# Patient Record
Sex: Male | Born: 1970
Health system: Southern US, Community
[De-identification: ages and names within clinical notes are randomized; demographics above are authoritative.]

## PROBLEM LIST (undated history)

## (undated) DIAGNOSIS — E785 Hyperlipidemia, unspecified: Secondary | ICD-10-CM

## (undated) DIAGNOSIS — T8859XA Other complications of anesthesia, initial encounter: Secondary | ICD-10-CM

## (undated) DIAGNOSIS — C6290 Malignant neoplasm of unspecified testis, unspecified whether descended or undescended: Secondary | ICD-10-CM

## (undated) DIAGNOSIS — T7840XA Allergy, unspecified, initial encounter: Secondary | ICD-10-CM

## (undated) DIAGNOSIS — Z9889 Other specified postprocedural states: Secondary | ICD-10-CM

## (undated) DIAGNOSIS — R112 Nausea with vomiting, unspecified: Secondary | ICD-10-CM

## (undated) DIAGNOSIS — M419 Scoliosis, unspecified: Secondary | ICD-10-CM

## (undated) DIAGNOSIS — T4145XA Adverse effect of unspecified anesthetic, initial encounter: Secondary | ICD-10-CM

## (undated) DIAGNOSIS — Z87442 Personal history of urinary calculi: Secondary | ICD-10-CM

## (undated) DIAGNOSIS — B2790 Infectious mononucleosis, unspecified without complication: Secondary | ICD-10-CM

## (undated) HISTORY — DX: Scoliosis, unspecified: M41.9

## (undated) HISTORY — DX: Malignant neoplasm of unspecified testis, unspecified whether descended or undescended: C62.90

## (undated) HISTORY — DX: Hyperlipidemia, unspecified: E78.5

## (undated) HISTORY — PX: BACK SURGERY: SHX140

## (undated) HISTORY — DX: Infectious mononucleosis, unspecified without complication: B27.90

## (undated) HISTORY — PX: UPPER GASTROINTESTINAL ENDOSCOPY: SHX188

## (undated) HISTORY — PX: WISDOM TOOTH EXTRACTION: SHX21

## (undated) HISTORY — DX: Allergy, unspecified, initial encounter: T78.40XA

---
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
## 2007-01-14 DIAGNOSIS — Z923 Personal history of irradiation: Secondary | ICD-10-CM

## 2007-01-14 DIAGNOSIS — C6290 Malignant neoplasm of unspecified testis, unspecified whether descended or undescended: Secondary | ICD-10-CM

## 2007-01-14 HISTORY — PX: OTHER SURGICAL HISTORY: SHX169

## 2007-01-14 HISTORY — DX: Personal history of irradiation: Z92.3

## 2007-01-14 HISTORY — DX: Malignant neoplasm of unspecified testis, unspecified whether descended or undescended: C62.90

## 2007-08-19 ENCOUNTER — Encounter: Admission: RE | Admit: 2007-08-19 | Discharge: 2007-08-19 | Payer: Self-pay | Admitting: Urology

## 2007-08-20 ENCOUNTER — Ambulatory Visit (HOSPITAL_BASED_OUTPATIENT_CLINIC_OR_DEPARTMENT_OTHER): Admission: RE | Admit: 2007-08-20 | Discharge: 2007-08-20 | Payer: Self-pay | Admitting: Urology

## 2007-08-20 ENCOUNTER — Encounter (INDEPENDENT_AMBULATORY_CARE_PROVIDER_SITE_OTHER): Payer: Self-pay | Admitting: Urology

## 2007-08-26 ENCOUNTER — Ambulatory Visit: Payer: Self-pay | Admitting: Oncology

## 2007-08-26 ENCOUNTER — Ambulatory Visit (HOSPITAL_COMMUNITY): Admission: RE | Admit: 2007-08-26 | Discharge: 2007-08-26 | Payer: Self-pay | Admitting: Urology

## 2007-09-08 LAB — CBC WITH DIFFERENTIAL/PLATELET
BASO%: 0.8 % (ref 0.0–2.0)
Eosinophils Absolute: 0.1 10*3/uL (ref 0.0–0.5)
MCHC: 35.2 g/dL (ref 32.0–35.9)
MONO#: 0.5 10*3/uL (ref 0.1–0.9)
NEUT#: 2.8 10*3/uL (ref 1.5–6.5)
Platelets: 205 10*3/uL (ref 145–400)
RBC: 4.85 10*6/uL (ref 4.20–5.71)
WBC: 5.2 10*3/uL (ref 4.0–10.0)
lymph#: 1.7 10*3/uL (ref 0.9–3.3)

## 2007-09-09 ENCOUNTER — Ambulatory Visit (HOSPITAL_COMMUNITY): Admission: RE | Admit: 2007-09-09 | Discharge: 2007-09-09 | Payer: Self-pay | Admitting: Oncology

## 2007-09-10 ENCOUNTER — Ambulatory Visit: Admission: RE | Admit: 2007-09-10 | Discharge: 2007-11-07 | Payer: Self-pay | Admitting: Radiation Oncology

## 2007-09-10 LAB — COMPREHENSIVE METABOLIC PANEL
ALT: 35 U/L (ref 0–53)
Albumin: 4.7 g/dL (ref 3.5–5.2)
CO2: 27 mEq/L (ref 19–32)
Calcium: 9.8 mg/dL (ref 8.4–10.5)
Chloride: 101 mEq/L (ref 96–112)
Glucose, Bld: 87 mg/dL (ref 70–99)
Potassium: 4.2 mEq/L (ref 3.5–5.3)
Sodium: 137 mEq/L (ref 135–145)
Total Protein: 7.2 g/dL (ref 6.0–8.3)

## 2007-09-10 LAB — AFP TUMOR MARKER: AFP-Tumor Marker: 2.8 ng/mL (ref 0.0–8.0)

## 2007-09-10 LAB — LACTATE DEHYDROGENASE: LDH: 112 U/L (ref 94–250)

## 2007-10-25 ENCOUNTER — Ambulatory Visit: Payer: Self-pay | Admitting: Oncology

## 2007-10-27 LAB — CBC WITH DIFFERENTIAL/PLATELET
Basophils Absolute: 0 10*3/uL (ref 0.0–0.1)
Eosinophils Absolute: 0.2 10*3/uL (ref 0.0–0.5)
HCT: 39.3 % (ref 38.7–49.9)
HGB: 14.1 g/dL (ref 13.0–17.1)
LYMPH%: 13.9 % — ABNORMAL LOW (ref 14.0–48.0)
MCV: 87.7 fL (ref 81.6–98.0)
MONO#: 0.5 10*3/uL (ref 0.1–0.9)
MONO%: 16.4 % — ABNORMAL HIGH (ref 0.0–13.0)
NEUT#: 1.8 10*3/uL (ref 1.5–6.5)
Platelets: 145 10*3/uL (ref 145–400)
RBC: 4.48 10*6/uL (ref 4.20–5.71)
WBC: 2.8 10*3/uL — ABNORMAL LOW (ref 4.0–10.0)

## 2007-10-29 LAB — LACTATE DEHYDROGENASE: LDH: 156 U/L (ref 94–250)

## 2007-10-29 LAB — COMPREHENSIVE METABOLIC PANEL
Albumin: 4.7 g/dL (ref 3.5–5.2)
Alkaline Phosphatase: 49 U/L (ref 39–117)
BUN: 13 mg/dL (ref 6–23)
CO2: 27 mEq/L (ref 19–32)
Glucose, Bld: 77 mg/dL (ref 70–99)
Sodium: 140 mEq/L (ref 135–145)
Total Bilirubin: 0.4 mg/dL (ref 0.3–1.2)
Total Protein: 7.2 g/dL (ref 6.0–8.3)

## 2007-10-29 LAB — AFP TUMOR MARKER: AFP-Tumor Marker: 3.9 ng/mL (ref 0.0–8.0)

## 2007-10-29 LAB — BETA HCG QUANT (REF LAB): Beta hCG, Tumor Marker: <0.5 m[IU]/mL (ref <5.0)

## 2007-12-28 ENCOUNTER — Ambulatory Visit: Payer: Self-pay | Admitting: Oncology

## 2008-01-05 ENCOUNTER — Ambulatory Visit (HOSPITAL_COMMUNITY): Admission: RE | Admit: 2008-01-05 | Discharge: 2008-01-05 | Payer: Self-pay | Admitting: Oncology

## 2008-01-14 HISTORY — PX: UPPER GI ENDOSCOPY: SHX6162

## 2008-01-17 LAB — CBC WITH DIFFERENTIAL/PLATELET
EOS%: 0.8 % (ref 0.0–7.0)
Eosinophils Absolute: 0 10*3/uL (ref 0.0–0.5)
MCH: 31.5 pg (ref 28.0–33.4)
MCV: 90.6 fL (ref 81.6–98.0)
MONO%: 8.1 % (ref 0.0–13.0)
NEUT#: 3.4 10*3/uL (ref 1.5–6.5)
RBC: 4.7 10*6/uL (ref 4.20–5.71)
RDW: 13.7 % (ref 11.2–14.6)
lymph#: 0.6 10*3/uL — ABNORMAL LOW (ref 0.9–3.3)

## 2008-01-19 LAB — AFP TUMOR MARKER: AFP-Tumor Marker: 4.3 ng/mL (ref 0.0–8.0)

## 2008-01-19 LAB — COMPREHENSIVE METABOLIC PANEL WITH GFR
ALT: 39 U/L (ref 0–53)
AST: 21 U/L (ref 0–37)
Albumin: 5.1 g/dL (ref 3.5–5.2)
Alkaline Phosphatase: 42 U/L (ref 39–117)
BUN: 12 mg/dL (ref 6–23)
CO2: 27 meq/L (ref 19–32)
Calcium: 10 mg/dL (ref 8.4–10.5)
Chloride: 102 meq/L (ref 96–112)
Creatinine, Ser: 0.87 mg/dL (ref 0.40–1.50)
Glucose, Bld: 116 mg/dL — ABNORMAL HIGH (ref 70–99)
Potassium: 3.9 meq/L (ref 3.5–5.3)
Sodium: 139 meq/L (ref 135–145)
Total Bilirubin: 0.6 mg/dL (ref 0.3–1.2)
Total Protein: 7.6 g/dL (ref 6.0–8.3)

## 2008-04-13 ENCOUNTER — Ambulatory Visit: Payer: Self-pay | Admitting: Oncology

## 2008-04-17 LAB — CBC WITH DIFFERENTIAL/PLATELET
Basophils Absolute: 0 10*3/uL (ref 0.0–0.1)
Eosinophils Absolute: 0 10*3/uL (ref 0.0–0.5)
HCT: 42.6 % (ref 38.4–49.9)
LYMPH%: 11.8 % — ABNORMAL LOW (ref 14.0–49.0)
MONO#: 0.4 10*3/uL (ref 0.1–0.9)
NEUT#: 3.8 10*3/uL (ref 1.5–6.5)
NEUT%: 78.9 % — ABNORMAL HIGH (ref 39.0–75.0)
Platelets: 180 10*3/uL (ref 140–400)
WBC: 4.8 10*3/uL (ref 4.0–10.3)

## 2008-04-17 LAB — COMPREHENSIVE METABOLIC PANEL
CO2: 28 mEq/L (ref 19–32)
Creatinine, Ser: 0.83 mg/dL (ref 0.40–1.50)
Glucose, Bld: 104 mg/dL — ABNORMAL HIGH (ref 70–99)
Total Bilirubin: 0.7 mg/dL (ref 0.3–1.2)
Total Protein: 7.5 g/dL (ref 6.0–8.3)

## 2008-04-17 LAB — LACTATE DEHYDROGENASE: LDH: 126 U/L (ref 94–250)

## 2008-04-19 LAB — BETA HCG QUANT (REF LAB): Beta hCG, Tumor Marker: <0.5 m[IU]/mL (ref <5.0)

## 2008-04-19 LAB — AFP TUMOR MARKER: AFP-Tumor Marker: 3.5 ng/mL (ref 0.0–8.0)

## 2008-07-24 ENCOUNTER — Ambulatory Visit: Payer: Self-pay | Admitting: Oncology

## 2008-08-17 LAB — CBC WITH DIFFERENTIAL/PLATELET
BASO%: 0.3 % (ref 0.0–2.0)
EOS%: 1 % (ref 0.0–7.0)
HCT: 42.1 % (ref 38.4–49.9)
LYMPH%: 19.3 % (ref 14.0–49.0)
MCH: 30.6 pg (ref 27.2–33.4)
MCHC: 34.2 g/dL (ref 32.0–36.0)
NEUT%: 69.8 % (ref 39.0–75.0)
Platelets: 166 10*3/uL (ref 140–400)
RBC: 4.7 10*6/uL (ref 4.20–5.82)

## 2008-08-17 LAB — COMPREHENSIVE METABOLIC PANEL
ALT: 27 U/L (ref 0–53)
AST: 24 U/L (ref 0–37)
Alkaline Phosphatase: 33 U/L — ABNORMAL LOW (ref 39–117)
Creatinine, Ser: 0.84 mg/dL (ref 0.40–1.50)
Sodium: 134 mEq/L — ABNORMAL LOW (ref 135–145)
Total Bilirubin: 0.6 mg/dL (ref 0.3–1.2)

## 2008-08-18 ENCOUNTER — Ambulatory Visit (HOSPITAL_COMMUNITY): Admission: RE | Admit: 2008-08-18 | Discharge: 2008-08-18 | Payer: Self-pay | Admitting: Oncology

## 2008-09-02 ENCOUNTER — Ambulatory Visit: Payer: Self-pay | Admitting: Family Medicine

## 2008-09-02 DIAGNOSIS — R079 Chest pain, unspecified: Secondary | ICD-10-CM

## 2008-09-03 ENCOUNTER — Encounter: Payer: Self-pay | Admitting: Family Medicine

## 2008-11-15 ENCOUNTER — Ambulatory Visit: Payer: Self-pay | Admitting: Oncology

## 2008-11-17 ENCOUNTER — Ambulatory Visit (HOSPITAL_COMMUNITY): Admission: RE | Admit: 2008-11-17 | Discharge: 2008-11-17 | Payer: Self-pay | Admitting: Oncology

## 2008-11-17 LAB — CBC WITH DIFFERENTIAL/PLATELET
BASO%: 0.3 % (ref 0.0–2.0)
Basophils Absolute: 0 10*3/uL (ref 0.0–0.1)
EOS%: 1 % (ref 0.0–7.0)
MCH: 31.4 pg (ref 27.2–33.4)
MCHC: 34.5 g/dL (ref 32.0–36.0)
MCV: 91 fL (ref 79.3–98.0)
MONO%: 10.7 % (ref 0.0–14.0)
RBC: 4.55 10*6/uL (ref 4.20–5.82)
RDW: 13.8 % (ref 11.0–14.6)
lymph#: 0.6 10*3/uL — ABNORMAL LOW (ref 0.9–3.3)

## 2008-11-19 LAB — COMPREHENSIVE METABOLIC PANEL
ALT: 41 U/L (ref 0–53)
AST: 22 U/L (ref 0–37)
Albumin: 4.6 g/dL (ref 3.5–5.2)
Alkaline Phosphatase: 43 U/L (ref 39–117)
BUN: 11 mg/dL (ref 6–23)
Calcium: 9.6 mg/dL (ref 8.4–10.5)
Chloride: 103 mEq/L (ref 96–112)
Potassium: 4.5 mEq/L (ref 3.5–5.3)

## 2008-11-19 LAB — BETA HCG QUANT (REF LAB): Beta hCG, Tumor Marker: <0.5 m[IU]/mL (ref <5.0)

## 2009-01-13 HISTORY — PX: OTHER SURGICAL HISTORY: SHX169

## 2009-02-14 ENCOUNTER — Ambulatory Visit: Payer: Self-pay | Admitting: Oncology

## 2009-03-16 ENCOUNTER — Ambulatory Visit (HOSPITAL_COMMUNITY): Admission: RE | Admit: 2009-03-16 | Discharge: 2009-03-16 | Payer: Self-pay | Admitting: Oncology

## 2009-03-16 ENCOUNTER — Ambulatory Visit: Payer: Self-pay | Admitting: Oncology

## 2009-03-16 LAB — CBC WITH DIFFERENTIAL/PLATELET
BASO%: 0.5 % (ref 0.0–2.0)
MCH: 31.4 pg (ref 27.2–33.4)
MONO%: 11.6 % (ref 0.0–14.0)
NEUT%: 65.4 % (ref 39.0–75.0)
Platelets: 157 10*3/uL (ref 140–400)
RBC: 4.67 10*6/uL (ref 4.20–5.82)
WBC: 3.4 10*3/uL — ABNORMAL LOW (ref 4.0–10.3)
lymph#: 0.7 10*3/uL — ABNORMAL LOW (ref 0.9–3.3)

## 2009-03-16 LAB — COMPREHENSIVE METABOLIC PANEL
ALT: 46 U/L (ref 0–53)
Alkaline Phosphatase: 40 U/L (ref 39–117)
Potassium: 4.3 mEq/L (ref 3.5–5.3)
Sodium: 136 mEq/L (ref 135–145)
Total Bilirubin: 0.5 mg/dL (ref 0.3–1.2)
Total Protein: 6.7 g/dL (ref 6.0–8.3)

## 2009-03-16 LAB — LACTATE DEHYDROGENASE: LDH: 103 U/L (ref 94–250)

## 2009-03-19 LAB — AFP TUMOR MARKER: AFP-Tumor Marker: 4.7 ng/mL (ref 0.0–8.0)

## 2009-07-20 ENCOUNTER — Ambulatory Visit: Payer: Self-pay | Admitting: Oncology

## 2009-09-13 ENCOUNTER — Ambulatory Visit: Payer: Self-pay | Admitting: Oncology

## 2009-12-31 ENCOUNTER — Ambulatory Visit: Payer: Self-pay | Admitting: Oncology

## 2010-01-01 ENCOUNTER — Ambulatory Visit (HOSPITAL_COMMUNITY)
Admission: RE | Admit: 2010-01-01 | Discharge: 2010-01-01 | Payer: Self-pay | Source: Home / Self Care | Attending: Oncology | Admitting: Oncology

## 2010-01-01 LAB — CBC WITH DIFFERENTIAL/PLATELET
BASO%: 0.6 % (ref 0.0–2.0)
Basophils Absolute: 0 10*3/uL (ref 0.0–0.1)
Eosinophils Absolute: 0 10*3/uL (ref 0.0–0.5)
HGB: 14.8 g/dL (ref 13.0–17.1)
MCHC: 34.4 g/dL (ref 32.0–36.0)
NEUT%: 64.4 % (ref 39.0–75.0)
Platelets: 172 10*3/uL (ref 140–400)
RDW: 14.3 % (ref 11.0–14.6)
WBC: 3.4 10*3/uL — ABNORMAL LOW (ref 4.0–10.3)

## 2010-01-01 LAB — CMP (CANCER CENTER ONLY)
Chloride: 103 mEq/L (ref 98–108)
Creat: 0.9 mg/dl (ref 0.6–1.2)
Glucose, Bld: 104 mg/dL (ref 73–118)
Total Bilirubin: 0.5 mg/dl (ref 0.20–1.60)
Total Protein: 7.1 g/dL (ref 6.4–8.1)

## 2010-01-03 LAB — BETA HCG QUANT (REF LAB): Beta hCG, Tumor Marker: 0.6 m[IU]/mL (ref <5.0)

## 2010-01-03 LAB — LACTATE DEHYDROGENASE: LDH: 123 U/L (ref 94–250)

## 2010-02-03 ENCOUNTER — Encounter: Payer: Self-pay | Admitting: Oncology

## 2010-05-28 NOTE — Op Note (Signed)
NAME:  Kevin Hess, Kevin Hess NO.:  [id number]   MEDICAL RECORD NO.:  [id number]          PATIENT TYPE:  AMB   LOCATION:  [facility]                         FACILITY:  [facility]   PHYSICIAN:  Ronald L. [name], M.D.  DATE OF BIRTH:  [birth year]   DATE OF PROCEDURE:  08/20/2007  DATE OF DISCHARGE:                               OPERATIVE REPORT   PREOPERATIVE DIAGNOSIS:  Right testicular mass.   POSTOPERATIVE DIAGNOSIS:  Right testicular mass.   PROCEDURE:  Right radical orchiectomy - inguinal approach.   ATTENDING PHYSICIAN:  Dr. Gaynelle Arabian.   RESIDENT PHYSICIAN:  Dr. Delman Kitten.   ANESTHESIA:  General plus local.   INDICATIONS FOR PROCEDURE:  Kevin Hess is a 40 year old white male with family  history positive for testicular cancer in two brothers, one had  seminoma, the other's path is unknown presently.  Both these were  diagnosed when they were in their mid 30s.  He was evaluated on August 12, 2007, after discovering a small mass in his right testicle on self-  testicular exam 24 hours prior to that ointment.  The mass was confirmed  by Dr. [name] as well as by ultrasound.  It was measured to be 1 cm x 1.5  cm x 1 cm.  He did have preoperative serum tumor markers which were all  negative.  Sperm banking was discussed as was testicular implant and the  patient is not interested in either of these options.  Risks, benefits,  consequences and concerns were discussed preoperatively and informed  consent was obtained.   PROCEDURE IN DETAIL:  The patient was brought to the operating room,  placed in supine position.  He was correctly identified by his wristband  and appropriate time-out was taken.  General anesthesia was delivered.  IV antibiotics were administered.  Once adequately anesthetized, his  right inguinal area as well as his scrotum were all clipped and his  entire lower abdomen and perineum were prepped and draped sterilely.  We  began our procedure by performing a close  thorough physical exam which  confirmed this mass.  No other abnormalities were noted.  Likewise, we  identified his anterior superior iliac spine and his pubic tubercle and  made an oblique incision sharply in the skin overlying the right  inguinal ligament.  We carried this down bluntly through the subcu  tissue and encountered 2 inferior epigastric veins, both of these were  double tied with chromic suture and ligated and cauterized.  We then got  to Scarpa's fascia, entered it sharply and extended our blunt dissection  down to his anterior rectus sheath/anterior wall of his inguinal canal.  The superficial inguinal ring was identified.  Of note, there was an  accessory branch of his inguinal nerve emanating from the anterior  aspect of the external ring.  We made a small nick in the anterior wall  of the inguinal canal and divided it laterally and medially in the  direction of its fibers.  We took great care to protect the ilioinguinal  nerve.  Of note, he did have the main nerve  that ran with the spermatic  cord as well as that accessory breast previously described.  Both these  were protected during our entire dissection.  Once we had dissected the  spermatic cord circumferentially using a blunt technique a quarter-inch  Penrose drain was passed around the spermatic cord twice and advanced  towards the level of the internal ring.  We then opened up our external  ring slightly in a caudal fashion which allowed [place] adequate space to  deliver the right testicle into the operative field.  We then used a  combination of sharp dissection and Bovie electrocautery to take down  all the gubernaculum fibers attaching it to the scrotum.  Once this was  done, our specimen was freely mobile.  We were able to visually identify  the tumor as well.  We then turned our attention to amputating our  specimen.  We progressed towards the proximal aspect of the cord at the  internal inguinal ring.  We  dissected the vas deferens free from our  spermatic cord and tied it with 0 silk sutures both medially and  laterally and divided it sharply.  We also dissected free multiple  cremasteric fibers and tied it with chromic suture laterally and divided  with Bovie electrocautery.  There was no active bleeding from this.  We  then double tied our spermatic cord with 2 sutures using 0 silk.  The  first one was a hand tie, the second was a stick tie.  These were both  lateral.  A single medial tie was placed with 0 silk suture as well and  the cord was then divided sharply.  Bovie electrocautery addressed the  spermatic cord stump.  There were no active bleeders from this and it  was returned to its intraperitoneal location.  We subsequently passed  our specimen off the table and turned our attention to our closure.  The  anterior wall of the inguinal canal was reapproximated with a running  suture of 3-0 Vicryl, taking great care not to incorporate either branch  of the ilioinguinal nerve in our closure.  We then closed Scarpa's  fascia with a running suture using 3-0 chromic.  We reapproximated the  subcutaneous tissues with multiple simple interrupted horizontal sutures  using 3-0 chromic as well and the skin was closed in subcuticular  fashion using 4-0 Vicryl.  Steri-Strips and Benzoin were applied  topically.  This marked the end of our procedure.  He tolerated the  procedure well.  There were no complications.  He awoke from anesthesia  and was taken to the recovery room in stable condition.  Dr. [name] was  present and participated in all aspects of the case.     ______________________________  Delman Kitten, M.D.      Ronald L. [name], M.D.  Electronically Signed    DW/MEDQ  D:  08/20/2007  T:  08/20/2007  Job:  [id number]

## 2010-07-03 ENCOUNTER — Other Ambulatory Visit: Payer: Self-pay | Admitting: Oncology

## 2010-07-03 ENCOUNTER — Encounter (HOSPITAL_BASED_OUTPATIENT_CLINIC_OR_DEPARTMENT_OTHER): Payer: BC Managed Care – PPO | Admitting: Oncology

## 2010-07-03 ENCOUNTER — Ambulatory Visit (HOSPITAL_COMMUNITY)
Admission: RE | Admit: 2010-07-03 | Discharge: 2010-07-03 | Disposition: A | Discharge status: Home / Self Care | Payer: BC Managed Care – PPO | Source: Ambulatory Visit | Attending: Oncology | Admitting: Oncology

## 2010-07-03 DIAGNOSIS — C6290 Malignant neoplasm of unspecified testis, unspecified whether descended or undescended: Secondary | ICD-10-CM

## 2010-07-03 LAB — CBC WITH DIFFERENTIAL/PLATELET
BASO%: 0.8 % (ref 0.0–2.0)
Basophils Absolute: 0 10*3/uL (ref 0.0–0.1)
EOS%: 1.1 % (ref 0.0–7.0)
HGB: 14.4 g/dL (ref 13.0–17.1)
MCH: 31.7 pg (ref 27.2–33.4)
MCHC: 35 g/dL (ref 32.0–36.0)
NEUT#: 2.9 10*3/uL (ref 1.5–6.5)
Platelets: 162 10*3/uL (ref 140–400)
WBC: 4.6 10*3/uL (ref 4.0–10.3)

## 2010-07-06 LAB — COMPREHENSIVE METABOLIC PANEL
Albumin: 4.7 g/dL (ref 3.5–5.2)
Alkaline Phosphatase: 42 U/L (ref 39–117)
BUN: 10 mg/dL (ref 6–23)
CO2: 24 mEq/L (ref 19–32)
Calcium: 10 mg/dL (ref 8.4–10.5)
Chloride: 103 mEq/L (ref 96–112)
Glucose, Bld: 90 mg/dL (ref 70–99)
Potassium: 4.4 mEq/L (ref 3.5–5.3)
Sodium: 139 mEq/L (ref 135–145)
Total Protein: 7.1 g/dL (ref 6.0–8.3)

## 2010-07-06 LAB — LACTATE DEHYDROGENASE: LDH: 117 U/L (ref 94–250)

## 2010-07-06 LAB — AFP TUMOR MARKER: AFP-Tumor Marker: 3.7 ng/mL (ref 0.0–8.0)

## 2010-07-06 LAB — BETA HCG QUANT (REF LAB): Beta hCG, Tumor Marker: 0.6 m[IU]/mL (ref <5.0)

## 2010-08-14 ENCOUNTER — Encounter (HOSPITAL_BASED_OUTPATIENT_CLINIC_OR_DEPARTMENT_OTHER): Payer: BC Managed Care – PPO | Admitting: Oncology

## 2010-08-14 DIAGNOSIS — C6290 Malignant neoplasm of unspecified testis, unspecified whether descended or undescended: Secondary | ICD-10-CM

## 2010-09-10 ENCOUNTER — Ambulatory Visit
Admission: RE | Admit: 2010-09-10 | Discharge: 2010-09-10 | Disposition: A | Discharge status: Home / Self Care | Payer: BC Managed Care – PPO | Source: Ambulatory Visit | Attending: Chiropractic Medicine | Admitting: Chiropractic Medicine

## 2010-09-10 ENCOUNTER — Other Ambulatory Visit: Payer: Self-pay | Admitting: Chiropractic Medicine

## 2010-09-10 DIAGNOSIS — M542 Cervicalgia: Secondary | ICD-10-CM

## 2010-09-10 DIAGNOSIS — M549 Dorsalgia, unspecified: Secondary | ICD-10-CM

## 2010-09-10 DIAGNOSIS — M419 Scoliosis, unspecified: Secondary | ICD-10-CM

## 2010-10-11 LAB — COMPREHENSIVE METABOLIC PANEL
AST: 26
Albumin: 4.4
Alkaline Phosphatase: 40
Chloride: 101
Creatinine, Ser: 0.87
GFR calc Af Amer: >60
Potassium: 4.1
Total Bilirubin: 0.8
Total Protein: 6.8

## 2010-10-11 LAB — CBC
HCT: 45.9
Hemoglobin: 15.6
MCHC: 34
Platelets: 189
RDW: 13.9

## 2011-01-25 ENCOUNTER — Telehealth: Payer: Self-pay | Admitting: Oncology

## 2011-01-25 NOTE — Telephone Encounter (Signed)
lmonvm for pt re appts for 2/22 and 2/26. Schedule mailed today.

## 2011-03-04 ENCOUNTER — Other Ambulatory Visit (HOSPITAL_BASED_OUTPATIENT_CLINIC_OR_DEPARTMENT_OTHER): Payer: BC Managed Care – PPO | Admitting: Lab

## 2011-03-04 ENCOUNTER — Encounter: Payer: Self-pay | Admitting: *Deleted

## 2011-03-04 DIAGNOSIS — C6290 Malignant neoplasm of unspecified testis, unspecified whether descended or undescended: Secondary | ICD-10-CM

## 2011-03-04 LAB — CBC WITH DIFFERENTIAL/PLATELET
Basophils Absolute: 0 10*3/uL (ref 0.0–0.1)
EOS%: 0.9 % (ref 0.0–7.0)
HGB: 14.2 g/dL (ref 13.0–17.1)
LYMPH%: 26 % (ref 14.0–49.0)
MCH: 30.4 pg (ref 27.2–33.4)
MCV: 89.5 fL (ref 79.3–98.0)
MONO%: 7.2 % (ref 0.0–14.0)
Platelets: 162 10*3/uL (ref 140–400)
RDW: 14.3 % (ref 11.0–14.6)

## 2011-03-07 ENCOUNTER — Other Ambulatory Visit: Payer: BC Managed Care – PPO

## 2011-03-07 LAB — COMPREHENSIVE METABOLIC PANEL
AST: 22 U/L (ref 0–37)
Alkaline Phosphatase: 39 U/L (ref 39–117)
BUN: 21 mg/dL (ref 6–23)
Creatinine, Ser: 0.86 mg/dL (ref 0.50–1.35)
Potassium: 4.1 mEq/L (ref 3.5–5.3)
Total Bilirubin: 0.4 mg/dL (ref 0.3–1.2)

## 2011-03-07 LAB — AFP TUMOR MARKER: AFP-Tumor Marker: 2.7 ng/mL (ref 0.0–8.0)

## 2011-03-07 LAB — BETA HCG QUANT (REF LAB): Beta hCG, Tumor Marker: <0.5 m[IU]/mL (ref <5.0)

## 2011-03-11 ENCOUNTER — Ambulatory Visit (HOSPITAL_BASED_OUTPATIENT_CLINIC_OR_DEPARTMENT_OTHER): Payer: BC Managed Care – PPO | Admitting: Oncology

## 2011-03-11 ENCOUNTER — Telehealth: Payer: Self-pay | Admitting: Oncology

## 2011-03-11 VITALS — BP 110/70 | HR 98 | Temp 98.0°F | Ht 72.0 in | Wt 221.3 lb

## 2011-03-11 DIAGNOSIS — C6290 Malignant neoplasm of unspecified testis, unspecified whether descended or undescended: Secondary | ICD-10-CM

## 2011-03-11 NOTE — Progress Notes (Signed)
Hematology and Oncology Follow Up Visit  Kevin Hess [id number] 06-18-1970 40 y.o. 03/11/2011 10:14 AM    CC: Kevin Hess, M.D. Kevin Hess, M.D. 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   Prior Therapy:  1. Status post radical orchiectomy in August 2009.  Pathology revealing a 2.2-cm classic seminoma. 2. Received adjuvant radiation therapy, a total of 26 Gy in 16 fractions.  Therapy concluded in October 2009.  Current therapy: Observation and surveillance.  Interim History: [name] presents today for a followup visit.  A pleasant 41 year old gentleman with seminomatous testicular cancer.  He is currently on observation and surveillance and he is  now more than 3 years out from conclusion of his radiation.  He is not reporting any symptomatology.  He is not reporting any abdominal pain.  He is not reporting any early satiety.  He is not reporting any lymphadenopathy at this point.  His appetite has been excellent.  Performance status is baseline.  There is really no major changes at this point.  He is not reporting any delayed toxicity from radiation at this time. He had headaches in recent months and was diagnosed with migraines.   Medications: I have reviewed the patient's current medications. Current outpatient prescriptions:Cholecalciferol (VITAMIN D3) LIQD, Take 4,000 Units by mouth daily., Disp: , Rfl: ;  SUMAtriptan (IMITREX) 100 MG tablet, Take 100 mg by mouth as needed., Disp: , Rfl:   Allergies:  Allergies  Allergen Reactions  . Penicillins     Past Medical History, Surgical history, Social history, and Family History were reviewed and updated.  Review of Systems: Constitutional:  Negative for fever, chills, night sweats, anorexia, weight loss, pain. Cardiovascular: no chest pain or dyspnea on exertion Respiratory: no cough, shortness of breath,  or wheezing Neurological: no TIA or stroke symptoms Dermatological: negative ENT: negative Skin: Negative. Gastrointestinal: no abdominal pain, change in bowel habits, or black or bloody stools Genito-Urinary: negative Hematological and Lymphatic: negative Breast: negative Musculoskeletal: negative Remaining ROS negative. Physical Exam: Blood pressure 110/70, pulse 98, temperature 98 F (36.7 C), temperature source Oral, height 6' (1.829 m), weight 221 lb 4.8 oz (100.381 kg). ECOG: 0 General appearance: alert Head: Normocephalic, without obvious abnormality, atraumatic Neck: no adenopathy, no carotid bruit, no JVD, supple, symmetrical, trachea midline and thyroid not enlarged, symmetric, no tenderness/mass/nodules Lymph nodes: Cervical, supraclavicular, and axillary nodes normal. Heart:regular rate and rhythm, S1, S2 normal, no murmur, click, rub or gallop Lung:chest clear, no wheezing, rales, normal symmetric air entry Abdomin: soft, non-tender, without masses or organomegaly EXT:no erythema, induration, or nodules   Lab Results: Lab Results  Component Value Date   WBC 4.2 03/04/2011   HGB 14.2 03/04/2011   HCT 41.9 03/04/2011   MCV 89.5 03/04/2011   PLT 162 03/04/2011     Chemistry      Component Value Date/Time   NA 135 03/04/2011 1349   NA 141 01/01/2010 0816   K 4.1 03/04/2011 1349   K 4.4 01/01/2010 0816   CL 100 03/04/2011 1349   CL 103 01/01/2010 0816   CO2 25 03/04/2011 1349   CO2 27 01/01/2010 0816   BUN 21 03/04/2011 1349   BUN 8 01/01/2010 0816   CREATININE 0.86 03/04/2011 1349   CREATININE 0.9 01/01/2010 0816      Component Value Date/Time   CALCIUM 9.7 03/04/2011 1349   CALCIUM 9.1  01/01/2010 0816   ALKPHOS 39 03/04/2011 1349   ALKPHOS 29 01/01/2010 0816   AST 22 03/04/2011 1349   AST 23 01/01/2010 0816   ALT 31 03/04/2011 1349   BILITOT 0.4 03/04/2011 1349   BILITOT 0.50 01/01/2010 0816     Results for Kevin Hess, Kevin Hess (MRN [id number]) as of 03/11/2011 10:18   Ref. Range 03/04/2011 13:49  AFP-Tumor Marker Latest Range: 0.0-8.0 ng/mL 2.7  Beta hCG, Tumor Marker Latest Range: < 5.0 mIU/mL < 0.5    Impression and Plan:  This is a 41 year old gentleman with the following issues. 1. Stage I seminoma status post orchiectomy followed by radiation therapy.  No evidence of disease at this point.  He is more than 3 years out from his surgery and the conclusion of his radiation.  For the time being, we will continue on active surveillance and repeat laboratory data and physical examination in 6 months, and chest x-ray in 6 months. Will do CT scans as needed.  2. Health maintenance.  Continue to advise him to follow up the date and age appropriate health screening and cancer screening at this time.  [facility], MD 2/26/201310:14 AM

## 2011-03-11 NOTE — Telephone Encounter (Signed)
Gv pt appt for aug2013. gv pt copy of chest xray order for aug2013

## 2011-08-29 ENCOUNTER — Telehealth: Payer: Self-pay | Admitting: Oncology

## 2011-08-29 NOTE — Telephone Encounter (Signed)
Moved 8/27 appt to 10/1. Lm for pt and mailed schedule. Pt to keep 8/23 lb appt.

## 2011-09-05 ENCOUNTER — Other Ambulatory Visit (HOSPITAL_BASED_OUTPATIENT_CLINIC_OR_DEPARTMENT_OTHER): Payer: BC Managed Care – PPO

## 2011-09-05 DIAGNOSIS — C6290 Malignant neoplasm of unspecified testis, unspecified whether descended or undescended: Secondary | ICD-10-CM

## 2011-09-05 LAB — CBC WITH DIFFERENTIAL/PLATELET
Basophils Absolute: 0 10*3/uL (ref 0.0–0.1)
HCT: 44.5 % (ref 38.4–49.9)
HGB: 15 g/dL (ref 13.0–17.1)
MONO#: 0.4 10*3/uL (ref 0.1–0.9)
NEUT#: 2.6 10*3/uL (ref 1.5–6.5)
NEUT%: 59.7 % (ref 39.0–75.0)
RDW: 14.4 % (ref 11.0–14.6)
WBC: 4.3 10*3/uL (ref 4.0–10.3)
lymph#: 1.2 10*3/uL (ref 0.9–3.3)

## 2011-09-08 LAB — COMPREHENSIVE METABOLIC PANEL
ALT: 30 U/L (ref 0–53)
AST: 19 U/L (ref 0–37)
Albumin: 4.5 g/dL (ref 3.5–5.2)
BUN: 11 mg/dL (ref 6–23)
CO2: 27 mEq/L (ref 19–32)
Calcium: 9.9 mg/dL (ref 8.4–10.5)
Chloride: 102 mEq/L (ref 96–112)
Creatinine, Ser: 0.86 mg/dL (ref 0.50–1.35)
Potassium: 4.5 mEq/L (ref 3.5–5.3)

## 2011-09-08 LAB — LACTATE DEHYDROGENASE: LDH: 115 U/L (ref 94–250)

## 2011-09-08 LAB — AFP TUMOR MARKER: AFP-Tumor Marker: 2.6 ng/mL (ref 0.0–8.0)

## 2011-09-09 ENCOUNTER — Ambulatory Visit: Payer: BC Managed Care – PPO | Admitting: Oncology

## 2011-10-13 ENCOUNTER — Other Ambulatory Visit: Payer: Self-pay | Admitting: Oncology

## 2011-10-13 ENCOUNTER — Ambulatory Visit (HOSPITAL_COMMUNITY)
Admission: RE | Admit: 2011-10-13 | Discharge: 2011-10-13 | Disposition: A | Discharge status: Home / Self Care | Payer: BC Managed Care – PPO | Source: Ambulatory Visit | Attending: Oncology | Admitting: Oncology

## 2011-10-13 DIAGNOSIS — Z8547 Personal history of malignant neoplasm of testis: Secondary | ICD-10-CM | POA: Insufficient documentation

## 2011-10-13 DIAGNOSIS — C6290 Malignant neoplasm of unspecified testis, unspecified whether descended or undescended: Secondary | ICD-10-CM

## 2011-10-14 ENCOUNTER — Telehealth: Payer: Self-pay | Admitting: Oncology

## 2011-10-14 ENCOUNTER — Ambulatory Visit (HOSPITAL_BASED_OUTPATIENT_CLINIC_OR_DEPARTMENT_OTHER): Payer: BC Managed Care – PPO | Admitting: Oncology

## 2011-10-14 VITALS — BP 124/80 | HR 64 | Temp 99.3°F | Resp 20 | Ht 72.0 in | Wt 223.8 lb

## 2011-10-14 DIAGNOSIS — C6290 Malignant neoplasm of unspecified testis, unspecified whether descended or undescended: Secondary | ICD-10-CM

## 2011-10-14 NOTE — Progress Notes (Signed)
Hematology and Oncology Follow Up Visit  Kevin Hess [id number] [birth year] 41 y.o. 10/14/2011 11:11 AM    CC: [name] L. [name], M.D. Oneita Hurt, M.D. 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  Prior Therapy:  1. Status post radical orchiectomy in August 2009.  Pathology revealing a 2.2-cm classic seminoma. 2. Received adjuvant radiation therapy, a total of 26 Gy in 16 fractions.  Therapy concluded in October 2009.  Current therapy: Observation and surveillance.  Interim History: Kevin Hess presents today for a followup visit.  A pleasant 41 year old gentleman with seminomatous testicular cancer.  He is currently on observation and surveillance and he is close  To 4 years out from conclusion of his radiation.  He is reporting some fatigue, weight gain and hair loss. His TSH was normal. He was found to have to testosterone and getting replacement.  He is not reporting any abdominal pain.  He is not reporting any early satiety.  He is not reporting any lymphadenopathy at this point.  Performance status is baseline.  There is really no major changes at this point.  He is not reporting any delayed toxicity from radiation at this time. He had headaches in recent months and was diagnosed with migraines.   Medications: I have reviewed the patient's current medications. Current outpatient prescriptions:Cholecalciferol (VITAMIN D3) LIQD, Take 4,000 Units by mouth daily., Disp: , Rfl: ;  SUMAtriptan (IMITREX) 100 MG tablet, Take 100 mg by mouth as needed., Disp: , Rfl:   Allergies:  Allergies  Allergen Reactions  . Penicillins     Past Medical History, Surgical history, Social history, and Family History were reviewed and updated.  Review of Systems: Constitutional:  Negative for fever, chills, night sweats, anorexia, weight loss, pain. Cardiovascular: no chest pain or dyspnea  on exertion Respiratory: no cough, shortness of breath, or wheezing Neurological: no TIA or stroke symptoms Dermatological: negative ENT: negative Skin: Negative. Gastrointestinal: no abdominal pain, change in bowel habits, or black or bloody stools Genito-Urinary: negative Hematological and Lymphatic: negative Breast: negative Musculoskeletal: negative Remaining ROS negative. Physical Exam: Blood pressure 124/80, pulse 64, temperature 99.3 F (37.4 C), temperature source Oral, resp. rate 20, height 6' (1.829 m), weight 223 lb 12.8 oz (101.515 kg). ECOG: 0 General appearance: alert Head: Normocephalic, without obvious abnormality, atraumatic Neck: no adenopathy, no carotid bruit, no JVD, supple, symmetrical, trachea midline and thyroid not enlarged, symmetric, no tenderness/mass/nodules Lymph nodes: Cervical, supraclavicular, and axillary nodes normal. Heart:regular rate and rhythm, S1, S2 normal, no murmur, click, rub or gallop Lung:chest clear, no wheezing, rales, normal symmetric air entry Abdomin: soft, non-tender, without masses or organomegaly EXT:no erythema, induration, or nodules   Lab Results: Lab Results  Component Value Date   WBC 4.3 09/05/2011   HGB 15.0 09/05/2011   HCT 44.5 09/05/2011   MCV 89.2 09/05/2011   PLT 171 09/05/2011     Chemistry      Component Value Date/Time   NA 138 09/05/2011 1313   NA 141 01/01/2010 0816   K 4.5 09/05/2011 1313   K 4.4 01/01/2010 0816   CL 102 09/05/2011 1313   CL 103 01/01/2010 0816   CO2 27 09/05/2011 1313   CO2 27 01/01/2010 0816   BUN 11 09/05/2011 1313   BUN 8 01/01/2010 0816   CREATININE 0.86 09/05/2011 1313   CREATININE 0.9 01/01/2010 0816  Component Value Date/Time   CALCIUM 9.9 09/05/2011 1313   CALCIUM 9.1 01/01/2010 0816   ALKPHOS 35* 09/05/2011 1313   ALKPHOS 29 01/01/2010 0816   AST 19 09/05/2011 1313   AST 23 01/01/2010 0816   ALT 30 09/05/2011 1313   BILITOT 0.3 09/05/2011 1313   BILITOT 0.50 01/01/2010  0816     Results for CAYETANO, MIKITA (MRN [id number]) as of 10/14/2011 10:51  Ref. Range 09/05/2011 13:13  AFP-Tumor Marker Latest Range: 0.0-8.0 ng/mL 2.6  Beta hCG, Tumor Marker Latest Range: < 5.0 mIU/mL < 0.5   CHEST - 2 VIEW  Comparison: July 03, 2010  Findings: Lungs are clear. Heart size and pulmonary vascularity  are normal. No adenopathy. There is rod fixation and thoracic  spine. Scoliosis is stable. There are no blastic or lytic bone  lesions.  IMPRESSION: Scoliosis with rod fixation. No neoplastic focus.  Lungs clear.  Impression and Plan:  This is a 41 year old gentleman with the following issues. 1. Stage I seminoma status post orchiectomy followed by radiation therapy.  No evidence of disease at this point.  He is almost 4 years out from his surgery and the conclusion of his radiation.  For the time being, we will continue on active surveillance and repeat laboratory data and physical examination in 6 months, and chest x-ray in 12 months. Will do CT scans as needed.  2. Health maintenance.  Continue to advise him to follow up the date and age appropriate health screening and cancer screening at this time.  Kevin Anglemyer, MD 10/1/201311:11 AM

## 2011-10-14 NOTE — Telephone Encounter (Signed)
lvm for pt...advised on april lab and march est....mailed appt schedule to pt....sed

## 2012-03-02 ENCOUNTER — Other Ambulatory Visit (HOSPITAL_BASED_OUTPATIENT_CLINIC_OR_DEPARTMENT_OTHER): Payer: BC Managed Care – PPO

## 2012-03-02 DIAGNOSIS — C6290 Malignant neoplasm of unspecified testis, unspecified whether descended or undescended: Secondary | ICD-10-CM

## 2012-03-02 LAB — CBC WITH DIFFERENTIAL/PLATELET
BASO%: 0.8 % (ref 0.0–2.0)
EOS%: 1.6 % (ref 0.0–7.0)
HCT: 43.5 % (ref 38.4–49.9)
LYMPH%: 28 % (ref 14.0–49.0)
MCH: 30.8 pg (ref 27.2–33.4)
MCHC: 34.5 g/dL (ref 32.0–36.0)
MONO#: 0.4 10*3/uL (ref 0.1–0.9)
NEUT%: 60.6 % (ref 39.0–75.0)
Platelets: 176 10*3/uL (ref 140–400)
RBC: 4.87 10*6/uL (ref 4.20–5.82)
WBC: 4.5 10*3/uL (ref 4.0–10.3)

## 2012-03-02 LAB — COMPREHENSIVE METABOLIC PANEL (CC13)
ALT: 52 U/L (ref 0–55)
AST: 27 U/L (ref 5–34)
Alkaline Phosphatase: 43 U/L (ref 40–150)
Creatinine: 0.9 mg/dL (ref 0.7–1.3)
Sodium: 140 mEq/L (ref 136–145)
Total Bilirubin: 0.39 mg/dL (ref 0.20–1.20)
Total Protein: 7.4 g/dL (ref 6.4–8.3)

## 2012-03-16 ENCOUNTER — Telehealth: Payer: Self-pay | Admitting: Oncology

## 2012-03-16 ENCOUNTER — Ambulatory Visit (HOSPITAL_BASED_OUTPATIENT_CLINIC_OR_DEPARTMENT_OTHER): Payer: BC Managed Care – PPO | Admitting: Oncology

## 2012-03-16 VITALS — BP 120/79 | HR 79 | Temp 98.3°F | Resp 20 | Ht 72.0 in | Wt 227.3 lb

## 2012-03-16 NOTE — Progress Notes (Signed)
Hematology and Oncology Follow Up Visit  Kevin Hess [id number] 04-Dec-1970 41 y.o. 03/16/2012 10:46 AM    CC: [name] L. [name], M.D. Oneita Hurt, M.D. 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  Prior Therapy:  1. Status post radical orchiectomy in August 2009.  Pathology revealing a 2.2-cm classic seminoma. 2. Received adjuvant radiation therapy, a total of 26 Gy in 16 fractions.  Therapy concluded in October 2009.  Current therapy: Observation and surveillance.  Interim History: Mr. Gee presents today for a followup visit.  A pleasant 42 year old gentleman with seminomatous testicular cancer.  He is currently on observation and surveillance and he is over 4 years out from conclusion of his radiation.  He is reporting some fatigue, weight gain but has improved some. He was found to have to testosterone and may start replacement soon. He is not reporting any abdominal pain.  He is not reporting any early satiety.  He is not reporting any lymphadenopathy at this point.  Performance status is baseline.  There is really no major changes at this point.  He is not reporting any delayed toxicity from radiation at this time.   Medications: I have reviewed the patient's current medications. Current outpatient prescriptions:Cholecalciferol (VITAMIN D3) LIQD, Take 4,000 Units by mouth daily., Disp: , Rfl: ;  DHEA 10 MG TABS, Take 10 mg by mouth daily., Disp: , Rfl: ;  fish oil-omega-3 fatty acids 1000 MG capsule, Take 1 g by mouth 2 (two) times daily., Disp: , Rfl: ;  hydrocortisone (CORTEF) 5 MG tablet, Take 5 mg by mouth daily., Disp: , Rfl:  lactobacillus acidophilus (BACID) TABS, Take 1 tablet by mouth daily. probiotic, Disp: , Rfl: ;  Melatonin 1 MG CAPS, Take 1 capsule by mouth at bedtime as needed., Disp: , Rfl: ;  Multiple Vitamin (MULTIVITAMIN) tablet, Take 1 tablet by mouth 2  (two) times daily., Disp: , Rfl: ;  SUMAtriptan (IMITREX) 100 MG tablet, Take 100 mg by mouth as needed., Disp: , Rfl:   Allergies:  Allergies  Allergen Reactions  . Penicillins     Past Medical History, Surgical history, Social history, and Family History were reviewed and updated.  Review of Systems: Constitutional:  Negative for fever, chills, night sweats, anorexia, weight loss, pain. Cardiovascular: no chest pain or dyspnea on exertion Respiratory: no cough, shortness of breath, or wheezing Neurological: no TIA or stroke symptoms Dermatological: negative ENT: negative Skin: Negative. Gastrointestinal: no abdominal pain, change in bowel habits, or black or bloody stools Genito-Urinary: negative Hematological and Lymphatic: negative Breast: negative Musculoskeletal: negative Remaining ROS negative. Physical Exam: Blood pressure 120/79, pulse 79, temperature 98.3 F (36.8 C), temperature source Oral, resp. rate 20, height 6' (1.829 m), weight 227 lb 4.8 oz (103.103 kg). ECOG: 0 General appearance: alert Head: Normocephalic, without obvious abnormality, atraumatic Neck: no adenopathy, no carotid bruit, no JVD, supple, symmetrical, trachea midline and thyroid not enlarged, symmetric, no tenderness/mass/nodules Lymph nodes: Cervical, supraclavicular, and axillary nodes normal. Heart:regular rate and rhythm, S1, S2 normal, no murmur, click, rub or gallop Lung:chest clear, no wheezing, rales, normal symmetric air entry Abdomin: soft, non-tender, without masses or organomegaly EXT:no erythema, induration, or nodules   Lab Results: Lab Results  Component Value Date   WBC 4.5 03/02/2012   HGB 15.0 03/02/2012   HCT 43.5 03/02/2012   MCV 89.2 03/02/2012   PLT 176 03/02/2012  Chemistry      Component Value Date/Time   NA 140 03/02/2012 1043   NA 138 09/05/2011 1313   NA 141 01/01/2010 0816   K 4.0 03/02/2012 1043   K 4.5 09/05/2011 1313   K 4.4 01/01/2010 0816   CL 104  03/02/2012 1043   CL 102 09/05/2011 1313   CL 103 01/01/2010 0816   CO2 27 03/02/2012 1043   CO2 27 09/05/2011 1313   CO2 27 01/01/2010 0816   BUN 9.4 03/02/2012 1043   BUN 11 09/05/2011 1313   BUN 8 01/01/2010 0816   CREATININE 0.9 03/02/2012 1043   CREATININE 0.86 09/05/2011 1313   CREATININE 0.9 01/01/2010 0816      Component Value Date/Time   CALCIUM 9.7 03/02/2012 1043   CALCIUM 9.9 09/05/2011 1313   CALCIUM 9.1 01/01/2010 0816   ALKPHOS 43 03/02/2012 1043   ALKPHOS 35* 09/05/2011 1313   ALKPHOS 29 01/01/2010 0816   AST 27 03/02/2012 1043   AST 19 09/05/2011 1313   AST 23 01/01/2010 0816   ALT 52 03/02/2012 1043   ALT 30 09/05/2011 1313   BILITOT 0.39 03/02/2012 1043   BILITOT 0.3 09/05/2011 1313   BILITOT 0.50 01/01/2010 0816     Results for Kevin Hess, Kevin Hess (MRN [id number]) as of 03/16/2012 10:19  Ref. Range 03/02/2012 10:43  AFP-Tumor Marker Latest Range: 0.0-8.0 ng/mL 3.6  Beta hCG, Tumor Marker Latest Range: <5.0 mIU/mL 0.7    Imession and Plan:  This is a 42 year old gentleman with the following issues. 1. Stage I seminoma status post orchiectomy followed by radiation therapy.  No evidence of disease at this point.  He is more that 4 years out from his surgery and the conclusion of his radiation.  For the time being, we will continue on active surveillance and repeat laboratory data and physical examination in 6 months, and chest x-ray in 6 months. Will do CT scans as needed.  2. Health maintenance.  Continue to advise him to follow up the date and age appropriate health screening and cancer screening at this time.  [facility], MD 3/4/201410:46 AM

## 2012-09-16 ENCOUNTER — Other Ambulatory Visit (HOSPITAL_BASED_OUTPATIENT_CLINIC_OR_DEPARTMENT_OTHER): Payer: BC Managed Care – PPO

## 2012-09-16 ENCOUNTER — Ambulatory Visit (HOSPITAL_COMMUNITY)
Admission: RE | Admit: 2012-09-16 | Discharge: 2012-09-16 | Disposition: A | Discharge status: Home / Self Care | Payer: BC Managed Care – PPO | Source: Ambulatory Visit | Attending: Oncology | Admitting: Oncology

## 2012-09-16 DIAGNOSIS — M4130 Thoracogenic scoliosis, site unspecified: Secondary | ICD-10-CM | POA: Insufficient documentation

## 2012-09-16 DIAGNOSIS — C6290 Malignant neoplasm of unspecified testis, unspecified whether descended or undescended: Secondary | ICD-10-CM

## 2012-09-16 DIAGNOSIS — C187 Malignant neoplasm of sigmoid colon: Secondary | ICD-10-CM

## 2012-09-16 LAB — COMPREHENSIVE METABOLIC PANEL (CC13)
Albumin: 3.9 g/dL (ref 3.5–5.0)
BUN: 10.1 mg/dL (ref 7.0–26.0)
CO2: 26 mEq/L (ref 22–29)
Glucose: 147 mg/dl — ABNORMAL HIGH (ref 70–140)
Sodium: 139 mEq/L (ref 136–145)
Total Bilirubin: 0.58 mg/dL (ref 0.20–1.20)
Total Protein: 7.2 g/dL (ref 6.4–8.3)

## 2012-09-16 LAB — CBC WITH DIFFERENTIAL/PLATELET
Basophils Absolute: 0 10*3/uL (ref 0.0–0.1)
Eosinophils Absolute: 0.1 10*3/uL (ref 0.0–0.5)
HCT: 43.5 % (ref 38.4–49.9)
HGB: 15 g/dL (ref 13.0–17.1)
LYMPH%: 19.7 % (ref 14.0–49.0)
MCV: 89.3 fL (ref 79.3–98.0)
MONO#: 0.5 10*3/uL (ref 0.1–0.9)
NEUT#: 4.3 10*3/uL (ref 1.5–6.5)
Platelets: 174 10*3/uL (ref 140–400)
RBC: 4.87 10*6/uL (ref 4.20–5.82)
WBC: 6.1 10*3/uL (ref 4.0–10.3)

## 2012-09-16 LAB — LACTATE DEHYDROGENASE (CC13): LDH: 173 U/L (ref 125–245)

## 2012-09-19 LAB — AFP TUMOR MARKER: AFP-Tumor Marker: 1.9 ng/mL (ref 0.0–8.0)

## 2012-09-19 LAB — BETA HCG QUANT (REF LAB): Beta hCG, Tumor Marker: <0.5 m[IU]/mL (ref <5.0)

## 2012-09-22 DIAGNOSIS — Z8547 Personal history of malignant neoplasm of testis: Secondary | ICD-10-CM | POA: Insufficient documentation

## 2012-09-22 DIAGNOSIS — N200 Calculus of kidney: Secondary | ICD-10-CM | POA: Insufficient documentation

## 2012-09-23 ENCOUNTER — Ambulatory Visit (HOSPITAL_BASED_OUTPATIENT_CLINIC_OR_DEPARTMENT_OTHER): Payer: BC Managed Care – PPO | Admitting: Oncology

## 2012-09-23 ENCOUNTER — Telehealth: Payer: Self-pay | Admitting: Oncology

## 2012-09-23 VITALS — BP 126/76 | HR 85 | Temp 98.7°F | Resp 18 | Ht 72.0 in | Wt 225.4 lb

## 2012-09-23 DIAGNOSIS — C6290 Malignant neoplasm of unspecified testis, unspecified whether descended or undescended: Secondary | ICD-10-CM

## 2012-09-23 NOTE — Progress Notes (Signed)
Hematology and Oncology Follow Up Visit  Kevin Hess [id number] 05/21/1970 42 y.o. 09/23/2012 10:06 AM    CC: [name] L. [name], M.D. Oneita Hurt, M.D. 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  Prior Therapy:  1. Status post radical orchiectomy in August 2009.  Pathology revealing a 2.2-cm classic seminoma. 2. Received adjuvant radiation therapy, a total of 26 Gy in 16 fractions.  Therapy concluded in October 2009.  Current therapy: Observation and surveillance.  Interim History: Kevin Hess presents today for a followup visit.  A pleasant 42 year old gentleman with seminomatous testicular cancer.  He is currently on observation and surveillance and he is about 5 years out from conclusion of his radiation.  He is reporting some intermittent abdominal and pelvic pain. Have been sporadic in nature lasting about 2-3 days radiating into the scrotal area. He was evaluated by Dr. [name] for possible kidney stone and he is scheduled to have a CT scan next week. He is not reporting any early satiety or vomiting  He is not reporting any lymphadenopathy at this point.  Performance status is baseline.  There is really no major changes at this point.  He is not reporting any delayed toxicity from radiation at this time.   Medications: I have reviewed the patient's current medications. Current Outpatient Prescriptions  Medication Sig Dispense Refill  . Cholecalciferol (VITAMIN D3) LIQD Take 4,000 Units by mouth daily.      [name] DHEA 10 MG TABS Take 10 mg by mouth daily.      . fish oil-omega-3 fatty acids 1000 MG capsule Take 1 g by mouth 2 (two) times daily.      . hydrocortisone (CORTEF) 5 MG tablet Take 5 mg by mouth daily.      [name] lactobacillus acidophilus (BACID) TABS Take 1 tablet by mouth daily. probiotic      . Melatonin 1 MG CAPS Take 1 capsule by mouth at bedtime as needed.       . Multiple Vitamin (MULTIVITAMIN) tablet Take 1 tablet by mouth 2 (two) times daily.      . SUMAtriptan (IMITREX) 100 MG tablet Take 100 mg by mouth as needed.       No current facility-administered medications for this visit.    Allergies:  Allergies  Allergen Reactions  . Penicillins     Past Medical History, Surgical history, Social history, and Family History were reviewed and updated.  Review of Systems: Constitutional:  Negative for fever, chills, night sweats, anorexia, weight loss, pain. Cardiovascular: no chest pain or dyspnea on exertion Respiratory: no cough, shortness of breath, or wheezing Neurological: no TIA or stroke symptoms Dermatological: negative ENT: negative Skin: Negative. Gastrointestinal: no abdominal pain, change in bowel habits, or black or bloody stools Genito-Urinary: negative Hematological and Lymphatic: negative Breast: negative Musculoskeletal: negative Remaining ROS negative. Physical Exam: Blood pressure 126/76, pulse 85, temperature 98.7 F (37.1 C), temperature source Oral, resp. rate 18, height 6' (1.829 m), weight 225 lb 6.4 oz (102.241 kg), SpO2 99.00%. ECOG: 0 General appearance: alert Head: Normocephalic, without obvious abnormality, atraumatic Neck: no adenopathy, no carotid bruit, no JVD, supple, symmetrical, trachea midline and thyroid not enlarged, symmetric, no tenderness/mass/nodules Lymph nodes: Cervical, supraclavicular, and axillary nodes normal. Heart:regular rate and rhythm, S1, S2 normal, no murmur, click, rub or gallop Lung:chest clear, no wheezing, rales, normal symmetric air entry Abdomin: soft, non-tender, without masses  or organomegaly EXT:no erythema, induration, or nodules Neurological exam: No motor or sensory deficits. Gait and balance all normal.  Lab Results: Lab Results  Component Value Date   WBC 6.1 09/16/2012   HGB 15.0 09/16/2012   HCT 43.5 09/16/2012   MCV 89.3 09/16/2012   PLT 174 09/16/2012      Chemistry      Component Value Date/Time   NA 139 09/16/2012 0932   NA 138 09/05/2011 1313   NA 141 01/01/2010 0816   K 4.3 09/16/2012 0932   K 4.5 09/05/2011 1313   K 4.4 01/01/2010 0816   CL 104 03/02/2012 1043   CL 102 09/05/2011 1313   CL 103 01/01/2010 0816   CO2 26 09/16/2012 0932   CO2 27 09/05/2011 1313   CO2 27 01/01/2010 0816   BUN 10.1 09/16/2012 0932   BUN 11 09/05/2011 1313   BUN 8 01/01/2010 0816   CREATININE 0.9 09/16/2012 0932   CREATININE 0.86 09/05/2011 1313   CREATININE 0.9 01/01/2010 0816      Component Value Date/Time   CALCIUM 9.5 09/16/2012 0932   CALCIUM 9.9 09/05/2011 1313   CALCIUM 9.1 01/01/2010 0816   ALKPHOS 44 09/16/2012 0932   ALKPHOS 35* 09/05/2011 1313   ALKPHOS 29 01/01/2010 0816   AST 20 09/16/2012 0932   AST 19 09/05/2011 1313   AST 23 01/01/2010 0816   ALT 39 09/16/2012 0932   ALT 30 09/05/2011 1313   ALT 28 01/01/2010 0816   BILITOT 0.58 09/16/2012 0932   BILITOT 0.3 09/05/2011 1313   BILITOT 0.50 01/01/2010 0816     Results for MARC, LEICHTER (MRN [id number]) as of 09/23/2012 09:52  Ref. Range 09/16/2012 09:31  AFP-Tumor Marker Latest Range: 0.0-8.0 ng/mL 1.9  Beta hCG, Tumor Marker Latest Range: < 5.0 mIU/mL < 0.5   CHEST - 2 VIEW  Comparison: 10/13/2011  Findings:  Enlargement of cardiac silhouette.  Mediastinal contours and pulmonary vascularity normal.  Lungs clear.  No pleural effusion or pneumothorax.  Broad-based dextroconvex thoracic scoliosis post spinal fixation.  IMPRESSION:  No acute abnormalities.  Mild enlargement of cardiac silhouette, potentially accentuated by  the scoliosis.   Imession and Plan:  This is a 42 year old gentleman with the following issues. 1. Stage I seminoma status post orchiectomy followed by radiation therapy.  No evidence of disease at this point. Laboratory data including tumor markers as well as chest x-ray from 09/16/2012 were discussed and suggest no evidence of relapse close to 5 years out out from his surgery  and the conclusion of his radiation.  For the time being, we will continue on active surveillance and repeat laboratory data and physical examination in 12 months.  Will do CT scans as needed.  2.  Abdominal pain and possible kidney stones: He is following up with Dr. [name] with a CT scan stone protocol. However, if the CT scans suggest malignancy clearly we will bring him in sooner and work him up with a more detailed scan.  [facility], MD 9/11/201410:06 AM

## 2012-09-23 NOTE — Telephone Encounter (Signed)
gv andprinted appt sched and avs for pt for SEpt 2015.Marland KitchenMarland Kitchenpt aware to have xray same day as lab

## 2012-09-24 ENCOUNTER — Encounter: Payer: Self-pay | Admitting: *Deleted

## 2012-09-24 NOTE — Progress Notes (Signed)
Updated medication list mailed to patient's home. 

## 2013-09-20 ENCOUNTER — Ambulatory Visit (HOSPITAL_COMMUNITY)
Admission: RE | Admit: 2013-09-20 | Discharge: 2013-09-20 | Disposition: A | Discharge status: Home / Self Care | Payer: BC Managed Care – PPO | Source: Ambulatory Visit | Attending: Oncology | Admitting: Oncology

## 2013-09-20 ENCOUNTER — Ambulatory Visit (HOSPITAL_BASED_OUTPATIENT_CLINIC_OR_DEPARTMENT_OTHER): Payer: BC Managed Care – PPO

## 2013-09-20 DIAGNOSIS — M4120 Other idiopathic scoliosis, site unspecified: Secondary | ICD-10-CM | POA: Insufficient documentation

## 2013-09-20 DIAGNOSIS — Z981 Arthrodesis status: Secondary | ICD-10-CM | POA: Diagnosis not present

## 2013-09-20 DIAGNOSIS — C6290 Malignant neoplasm of unspecified testis, unspecified whether descended or undescended: Secondary | ICD-10-CM | POA: Diagnosis not present

## 2013-09-20 LAB — CBC WITH DIFFERENTIAL/PLATELET
BASO%: 0.5 % (ref 0.0–2.0)
BASOS ABS: 0 10*3/uL (ref 0.0–0.1)
EOS ABS: 0.1 10*3/uL (ref 0.0–0.5)
EOS%: 1.2 % (ref 0.0–7.0)
HEMATOCRIT: 43 % (ref 38.4–49.9)
HEMOGLOBIN: 14.8 g/dL (ref 13.0–17.1)
LYMPH#: 1.1 10*3/uL (ref 0.9–3.3)
LYMPH%: 26.9 % (ref 14.0–49.0)
MCH: 30.3 pg (ref 27.2–33.4)
MCHC: 34.4 g/dL (ref 32.0–36.0)
MCV: 88.1 fL (ref 79.3–98.0)
MONO#: 0.4 10*3/uL (ref 0.1–0.9)
MONO%: 10.6 % (ref 0.0–14.0)
NEUT%: 60.8 % (ref 39.0–75.0)
NEUTROS ABS: 2.5 10*3/uL (ref 1.5–6.5)
Platelets: 182 10*3/uL (ref 140–400)
RBC: 4.88 10*6/uL (ref 4.20–5.82)
RDW: 13.6 % (ref 11.0–14.6)
WBC: 4.2 10*3/uL (ref 4.0–10.3)

## 2013-09-20 LAB — COMPREHENSIVE METABOLIC PANEL (CC13)
ALBUMIN: 4.1 g/dL (ref 3.5–5.0)
ALT: 19 U/L (ref 0–55)
ANION GAP: 7 meq/L (ref 3–11)
AST: 17 U/L (ref 5–34)
Alkaline Phosphatase: 38 U/L — ABNORMAL LOW (ref 40–150)
BUN: 10.4 mg/dL (ref 7.0–26.0)
CALCIUM: 9.8 mg/dL (ref 8.4–10.4)
CHLORIDE: 106 meq/L (ref 98–109)
CO2: 27 meq/L (ref 22–29)
CREATININE: 0.8 mg/dL (ref 0.7–1.3)
GLUCOSE: 82 mg/dL (ref 70–140)
POTASSIUM: 4.5 meq/L (ref 3.5–5.1)
Sodium: 140 mEq/L (ref 136–145)
Total Bilirubin: 0.28 mg/dL (ref 0.20–1.20)
Total Protein: 7.1 g/dL (ref 6.4–8.3)

## 2013-09-20 LAB — LACTATE DEHYDROGENASE (CC13): LDH: 142 U/L (ref 125–245)

## 2013-09-23 LAB — AFP TUMOR MARKER-PREVIOUS METHOD: AFP Tumor Marker: 2.5 ng/mL (ref 0.0–8.0)

## 2013-09-23 LAB — AFP TUMOR MARKER: AFP-Tumor Marker: 2.9 ng/mL (ref <6.1)

## 2013-09-23 LAB — BETA HCG QUANT (REF LAB)

## 2013-09-27 ENCOUNTER — Ambulatory Visit (HOSPITAL_BASED_OUTPATIENT_CLINIC_OR_DEPARTMENT_OTHER): Payer: BC Managed Care – PPO | Admitting: Oncology

## 2013-09-27 ENCOUNTER — Encounter: Payer: Self-pay | Admitting: Oncology

## 2013-09-27 VITALS — BP 119/75 | HR 64 | Temp 98.4°F | Resp 18 | Ht 72.0 in | Wt 214.4 lb

## 2013-09-27 DIAGNOSIS — C6290 Malignant neoplasm of unspecified testis, unspecified whether descended or undescended: Secondary | ICD-10-CM

## 2013-09-27 NOTE — Progress Notes (Signed)
Mailed revised medication list to patient's home. 

## 2013-09-27 NOTE — Addendum Note (Signed)
Addended by: Randolm Idol on: 09/27/2013 10:19 AM   Modules accepted: Orders, Medications

## 2013-09-27 NOTE — Progress Notes (Signed)
Hematology and Oncology Follow Up Visit  Kevin Hess [id number] 08-25-1970 43 y.o. 09/27/2013 9:52 AM    CC: Kevin Hess, M.D. Kevin Hess, M.D. 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  Prior Therapy:  1. Status post radical orchiectomy in August 2009.  Pathology revealing a 2.2-cm classic seminoma. 2. Received adjuvant radiation therapy, a total of 26 Gy in 16 fractions.  Therapy concluded in October 2009.  Current therapy: Observation and surveillance.  Interim History: Kevin Hess presents today for a followup visit. Since his last visit, he was started on testosterone supplements and that helped his symptoms significantly. His reporting less fatigue, more energy and less headaches. He has not reported any neurological symptoms or syncope. He has not reported any fevers chills or sweats. Has not reported any abdominal pain. He is not reporting any early satiety or vomiting  He is not reporting any lymphadenopathy at this point.  He has not reported any frequency urgency or hesitancy. Performance status is baseline. He is not reporting any delayed toxicity from radiation at this time. Rest of his review of systems unremarkable.  Medications: I have reviewed the patient's current medications. Current Outpatient Prescriptions  Medication Sig Dispense Refill  . DHEA 10 MG TABS Take 30 mg by mouth daily.       . Digestive Enzymes (SIMILASE PO) Take 1 tablet by mouth daily.      . ergocalciferol (VITAMIN D2) 50000 UNITS capsule Take 50,000 Units by mouth once a week. Takes Monday through friday      . fish oil-omega-3 fatty acids 1000 MG capsule Take 1 g by mouth 2 (two) times daily.      . hydrocortisone (CORTEF) 5 MG tablet Take 7.5 mg by mouth daily.       . Melatonin 1 MG CAPS Take 1 capsule by mouth at bedtime as needed.      . Multiple Vitamin (MULTIVITAMIN)  tablet Take 1 tablet by mouth 2 (two) times daily.      . NON FORMULARY Place 1 drop under the tongue 3 (three) times daily. Before meals      . NON FORMULARY Take 1 tablet by mouth 2 (two) times daily. Ultra hepa trope 11     For liver support      . NON FORMULARY Take 1,000 mg by mouth every Monday, Wednesday, and Friday. For adrenal support      . NON FORMULARY Take 1 tablet by mouth daily. probiotic      . OLIVE LEAF EXTRACT PO Take 1 tablet by mouth 2 (two) times daily. For immune support      . SUMAtriptan (IMITREX) 100 MG tablet Take 100 mg by mouth as needed.       No current facility-administered medications for this visit.    Allergies:  Allergies  Allergen Reactions  . Penicillins     Past Medical History, Surgical history, Social history, and Family History were reviewed and updated.  Physical Exam: Blood pressure 119/75, pulse 64, temperature 98.4 F (36.9 C), temperature source Oral, resp. rate 18, height 6' (1.829 m), weight 214 lb 6.4 oz (97.251 kg), SpO2 97.00%. ECOG: 0 General appearance: alert Head: Normocephalic, without obvious abnormality, atraumatic Neck: no adenopathy. Lymph nodes: Cervical, supraclavicular, and axillary nodes normal. Heart:regular rate and rhythm, S1, S2 normal, no murmur, click, rub or gallop Lung:chest clear, no wheezing, rales,  normal symmetric air entry Abdomin: soft, non-tender, without masses or organomegaly EXT:no erythema, induration, or nodules Neurological exam: No motor or sensory deficits. Gait and balance all normal.  Lab Results: Lab Results  Component Value Date   WBC 4.2 09/20/2013   HGB 14.8 09/20/2013   HCT 43.0 09/20/2013   MCV 88.1 09/20/2013   PLT 182 09/20/2013     Chemistry      Component Value Date/Time   NA 140 09/20/2013 1001   NA 138 09/05/2011 1313   NA 141 01/01/2010 0816   K 4.5 09/20/2013 1001   K 4.5 09/05/2011 1313   K 4.4 01/01/2010 0816   CL 104 03/02/2012 1043   CL 102 09/05/2011 1313   CL 103 01/01/2010  0816   CO2 27 09/20/2013 1001   CO2 27 09/05/2011 1313   CO2 27 01/01/2010 0816   BUN 10.4 09/20/2013 1001   BUN 11 09/05/2011 1313   BUN 8 01/01/2010 0816   CREATININE 0.8 09/20/2013 1001   CREATININE 0.86 09/05/2011 1313   CREATININE 0.9 01/01/2010 0816      Component Value Date/Time   CALCIUM 9.8 09/20/2013 1001   CALCIUM 9.9 09/05/2011 1313   CALCIUM 9.1 01/01/2010 0816   ALKPHOS 38* 09/20/2013 1001   ALKPHOS 35* 09/05/2011 1313   ALKPHOS 29 01/01/2010 0816   AST 17 09/20/2013 1001   AST 19 09/05/2011 1313   AST 23 01/01/2010 0816   ALT 19 09/20/2013 1001   ALT 30 09/05/2011 1313   ALT 28 01/01/2010 0816   BILITOT 0.28 09/20/2013 1001   BILITOT 0.3 09/05/2011 1313   BILITOT 0.50 01/01/2010 0816     Results for Kevin Hess (MRN [id number]) as of 09/27/2013 09:23  Ref. Range 09/20/2013 10:01  AFP-Tumor Marker Latest Range: <6.1 ng/mL 2.9  Beta hCG, Tumor Marker Latest Range: < 5.0 mIU/mL < 2.0     EXAM:  CHEST 2 VIEW  COMPARISON: Chest radiograph 09/16/2012 and CT chest abdomen pelvis  01/01/2010.  FINDINGS:  Stable heart, mediastinal, and hilar contours. Stable convex right  scoliosis of the thoracic spine in this patient status post spinal  fixation with rods. The trachea is midline. The pulmonary  vascularity is normal. The lungs are well expanded and clear. No  airspace disease. No radiographic evidence of pulmonary metastatic  disease. Negative for pleural effusion. No acute suspicious osseous  abnormality.  IMPRESSION:  Stable examination. No evidence of acute cardiopulmonary disease or  pulmonary metastatic disease.   Imession and Plan:  This is a 43 year old gentleman with the following issues. 1. Stage I seminoma status post orchiectomy followed by radiation therapy.  No evidence of disease at this point. Laboratory data including tumor markers as well as chest x-ray from 09/20/2013 were discussed and suggest no evidence of relapse cover 5 years out out from his surgery and  the conclusion of his radiation.  For the time being, we will continue on active surveillance and repeat laboratory data and physical examination in 12 months.  Will do CT scans as needed. I suggest we continue surveillance for at least 10 years and possibly indefinitely.  2.  Abdominal pain and possible kidney stones: He is following up with Dr. Rosana Hess. This has improved since the last visit. 3.      Testosterone deficiency: He is currently on testosterone cream with improvement in his symptoms.  [facility], MD 9/15/20159:52 AM

## 2013-09-28 ENCOUNTER — Telehealth: Payer: Self-pay | Admitting: Oncology

## 2013-09-28 NOTE — Telephone Encounter (Signed)
per pof to sch appt-remind pt to get XrY PRIOR TO APPT-will call p

## 2013-09-29 ENCOUNTER — Telehealth: Payer: Self-pay | Admitting: Oncology

## 2013-09-29 NOTE — Telephone Encounter (Signed)
cld pt & left message of time & date of appt-Adv pt about the XRAY @ [facility] prior toappt-mailed copy

## 2014-10-02 ENCOUNTER — Encounter: Payer: Self-pay | Admitting: *Deleted

## 2014-10-03 ENCOUNTER — Telehealth: Payer: Self-pay | Admitting: Oncology

## 2014-10-03 ENCOUNTER — Other Ambulatory Visit: Payer: Self-pay | Admitting: Oncology

## 2014-10-03 ENCOUNTER — Ambulatory Visit (HOSPITAL_BASED_OUTPATIENT_CLINIC_OR_DEPARTMENT_OTHER): Payer: BLUE CROSS/BLUE SHIELD | Admitting: Oncology

## 2014-10-03 ENCOUNTER — Other Ambulatory Visit (HOSPITAL_BASED_OUTPATIENT_CLINIC_OR_DEPARTMENT_OTHER): Payer: BLUE CROSS/BLUE SHIELD

## 2014-10-03 VITALS — BP 127/83 | HR 63 | Temp 98.3°F | Resp 19 | Ht 72.0 in | Wt 229.0 lb

## 2014-10-03 DIAGNOSIS — C6290 Malignant neoplasm of unspecified testis, unspecified whether descended or undescended: Secondary | ICD-10-CM | POA: Diagnosis not present

## 2014-10-03 LAB — CBC WITH DIFFERENTIAL/PLATELET
BASO%: 0.8 % (ref 0.0–2.0)
Basophils Absolute: 0 10*3/uL (ref 0.0–0.1)
EOS%: 2.4 % (ref 0.0–7.0)
Eosinophils Absolute: 0.1 10*3/uL (ref 0.0–0.5)
HCT: 48.5 % (ref 38.4–49.9)
HEMOGLOBIN: 16.4 g/dL (ref 13.0–17.1)
LYMPH%: 29.9 % (ref 14.0–49.0)
MCH: 30.9 pg (ref 27.2–33.4)
MCHC: 33.9 g/dL (ref 32.0–36.0)
MCV: 91.1 fL (ref 79.3–98.0)
MONO#: 0.5 10*3/uL (ref 0.1–0.9)
MONO%: 11.2 % (ref 0.0–14.0)
NEUT%: 55.7 % (ref 39.0–75.0)
NEUTROS ABS: 2.5 10*3/uL (ref 1.5–6.5)
Platelets: 175 10*3/uL (ref 140–400)
RBC: 5.33 10*6/uL (ref 4.20–5.82)
RDW: 13.6 % (ref 11.0–14.6)
WBC: 4.6 10*3/uL (ref 4.0–10.3)
lymph#: 1.4 10*3/uL (ref 0.9–3.3)

## 2014-10-03 LAB — COMPREHENSIVE METABOLIC PANEL (CC13)
ALBUMIN: 4.2 g/dL (ref 3.5–5.0)
ALK PHOS: 38 U/L — AB (ref 40–150)
ALT: 29 U/L (ref 0–55)
AST: 19 U/L (ref 5–34)
Anion Gap: 6 mEq/L (ref 3–11)
BUN: 10.6 mg/dL (ref 7.0–26.0)
CO2: 26 mEq/L (ref 22–29)
Calcium: 9.4 mg/dL (ref 8.4–10.4)
Chloride: 106 mEq/L (ref 98–109)
Creatinine: 0.9 mg/dL (ref 0.7–1.3)
EGFR: >90 mL/min/{1.73_m2} (ref >90)
GLUCOSE: 104 mg/dL (ref 70–140)
POTASSIUM: 4.7 meq/L (ref 3.5–5.1)
SODIUM: 138 meq/L (ref 136–145)
TOTAL PROTEIN: 6.7 g/dL (ref 6.4–8.3)
Total Bilirubin: 0.58 mg/dL (ref 0.20–1.20)

## 2014-10-03 LAB — LACTATE DEHYDROGENASE (CC13): LDH: 136 U/L (ref 125–245)

## 2014-10-03 NOTE — Progress Notes (Signed)
Hematology and Oncology Follow Up Visit  Kevin Hess [id number] August 11, 1970 44 y.o. 10/03/2014 9:26 AM    CC: Kevin Hess, M.D. Kevin Hess, M.D. 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  Prior Therapy:  1. Status post radical orchiectomy in August 2009.  Pathology revealing a 2.2-cm classic seminoma. 2. Received adjuvant radiation therapy, a total of 26 Gy in 16 fractions.  Therapy concluded in October 2009.  Current therapy: Observation and surveillance.  Interim History: Kevin Hess presents today for a followup visit. Since his last visit, he continues improvement with his energy and performance status. He was started on testosterone supplements via injections and that helped his symptoms significantly. His reporting less fatigue, more energy and less headaches. He is exercising regularly and have gained more muscle weight. He has not reported any neurological symptoms or syncope. He has not reported any fevers chills or sweats. Has not reported any abdominal pain. He is not reporting any early satiety or vomiting  He is not reporting any lymphadenopathy at this point.  He has not reported any frequency urgency or hesitancy.  He is not reporting any delayed toxicity from radiation at this time. Rest of his review of systems unremarkable.  Medications: I have reviewed the patient's current medications. Current Outpatient Prescriptions  Medication Sig Dispense Refill  . Cholecalciferol (VITAMIN D3) 5000 UNITS CAPS Take 5,000 Units by mouth daily. Takes Monday thru friday    . fish oil-omega-3 fatty acids 1000 MG capsule Take 1 g by mouth 2 (two) times daily.    . Hydrocortisone (CORTEF PO) Take 15 mg by mouth daily.    . Melatonin 1 MG CAPS Take 0.5 capsules by mouth at bedtime as needed.     . Multiple Vitamin (MULTIVITAMIN) tablet Take 1 tablet by mouth 2 (two)  times daily.    . NON FORMULARY Place 1 drop under the tongue 3 (three) times daily. Before meals    . NON FORMULARY Take 1 tablet by mouth 2 (two) times daily. Ultra hepa trope 11     For liver support    . NON FORMULARY Take 1,000 mg by mouth every Monday, Wednesday, and Friday. For adrenal support    . NON FORMULARY Take 1 tablet by mouth daily. probiotic    . Nutritional Supplements (DHEA PO) Take 35 mg by mouth daily.    [name] OLIVE LEAF EXTRACT PO Take 1 tablet by mouth 2 (two) times daily. For immune support    . testosterone cypionate (DEPOTESTOTERONE CYPIONATE) 100 MG/ML injection Inject 100 mg into the muscle once a week. For IM use only     No current facility-administered medications for this visit.    Allergies:  Allergies  Allergen Reactions  . Penicillins     Past Medical History, Surgical history, Social history, and Family History were reviewed and updated.  Physical Exam: Blood pressure 127/83, pulse 63, temperature 98.3 F (36.8 C), temperature source Oral, resp. rate 19, height 6' (1.829 m), weight 229 lb (103.874 kg). ECOG: 0 General appearance: alert overweight gentleman without distress. Head: Normocephalic, without obvious abnormality Neck: no adenopathy. Lymph nodes: Cervical, supraclavicular, and axillary nodes normal. no evidence of inguinal adenopathy either. Heart:regular rate and rhythm, S1, S2 normal, no murmur, click, rub or gallop Lung:chest clear, no wheezing, rales, normal symmetric air entry Abdomin: soft, non-tender, without masses or organomegaly EXT:no erythema, induration,  or nodules Neurological exam: No motor or sensory deficits.   Lab Results: Lab Results  Component Value Date   WBC 4.6 10/03/2014   HGB 16.4 10/03/2014   HCT 48.5 10/03/2014   MCV 91.1 10/03/2014   PLT 175 10/03/2014     Chemistry      Component Value Date/Time   NA 140 09/20/2013 1001   NA 138 09/05/2011 1313   NA 141 01/01/2010 0816   K 4.5 09/20/2013 1001   K  4.5 09/05/2011 1313   K 4.4 01/01/2010 0816   CL 104 03/02/2012 1043   CL 102 09/05/2011 1313   CL 103 01/01/2010 0816   CO2 27 09/20/2013 1001   CO2 27 09/05/2011 1313   CO2 27 01/01/2010 0816   BUN 10.4 09/20/2013 1001   BUN 11 09/05/2011 1313   BUN 8 01/01/2010 0816   CREATININE 0.8 09/20/2013 1001   CREATININE 0.86 09/05/2011 1313   CREATININE 0.9 01/01/2010 0816      Component Value Date/Time   CALCIUM 9.8 09/20/2013 1001   CALCIUM 9.9 09/05/2011 1313   CALCIUM 9.1 01/01/2010 0816   ALKPHOS 38* 09/20/2013 1001   ALKPHOS 35* 09/05/2011 1313   ALKPHOS 29 01/01/2010 0816   AST 17 09/20/2013 1001   AST 19 09/05/2011 1313   AST 23 01/01/2010 0816   ALT 19 09/20/2013 1001   ALT 30 09/05/2011 1313   ALT 28 01/01/2010 0816   BILITOT 0.28 09/20/2013 1001   BILITOT 0.3 09/05/2011 1313   BILITOT 0.50 01/01/2010 0816     Results for Kevin Hess (MRN [id number]) as of 09/27/2013 09:23  Ref. Range 09/20/2013 10:01  AFP-Tumor Marker Latest Range: <6.1 ng/mL 2.9  Beta hCG, Tumor Marker Latest Range: < 5.0 mIU/mL < 2.0     EXAM:  CHEST 2 VIEW  COMPARISON: Chest radiograph 09/16/2012 and CT chest abdomen pelvis  01/01/2010.  FINDINGS:  Stable heart, mediastinal, and hilar contours. Stable convex right  scoliosis of the thoracic spine in this patient status post spinal  fixation with rods. The trachea is midline. The pulmonary  vascularity is normal. The lungs are well expanded and clear. No  airspace disease. No radiographic evidence of pulmonary metastatic  disease. Negative for pleural effusion. No acute suspicious osseous  abnormality.  IMPRESSION:  Stable examination. No evidence of acute cardiopulmonary disease or  pulmonary metastatic disease.   Imession and Plan:  This is a 44 year old gentleman with the following issues. 1. Stage I seminoma status post orchiectomy followed by radiation therapy.  No evidence of disease at this point. Laboratory data including tumor  markers as well as chest x-ray from 09/20/2013 showed no evidence of relapse. Laboratory testing repeated today and currently pending.  The plan is to continue with active surveillance on an annual basis given the late recurrences associated with seminoma. These follow-ups will include physical examination and laboratory testing. Imaging studies will be done as needed. 2. Testosterone deficiency: He is currently receiving testosterone supplements by his primary care provider. 3. Age-appropriate cancer screening: She is up-to-date at this time.   [facility], MD 9/20/20169:26 AM

## 2014-10-03 NOTE — Telephone Encounter (Signed)
per pof to sch pt appt-gave pt copy of avs °

## 2014-10-06 LAB — AFP TUMOR MARKER: AFP TUMOR MARKER: 3.2 ng/mL (ref <6.1)

## 2014-10-06 LAB — BETA HCG QUANT (REF LAB)

## 2015-10-04 ENCOUNTER — Ambulatory Visit (HOSPITAL_BASED_OUTPATIENT_CLINIC_OR_DEPARTMENT_OTHER): Payer: BLUE CROSS/BLUE SHIELD | Admitting: Oncology

## 2015-10-04 ENCOUNTER — Telehealth: Payer: Self-pay | Admitting: Oncology

## 2015-10-04 ENCOUNTER — Other Ambulatory Visit (HOSPITAL_BASED_OUTPATIENT_CLINIC_OR_DEPARTMENT_OTHER): Payer: BLUE CROSS/BLUE SHIELD

## 2015-10-04 VITALS — BP 128/83 | HR 64 | Temp 98.7°F | Resp 18 | Ht 72.0 in | Wt 221.7 lb

## 2015-10-04 DIAGNOSIS — E291 Testicular hypofunction: Secondary | ICD-10-CM | POA: Diagnosis not present

## 2015-10-04 DIAGNOSIS — C6290 Malignant neoplasm of unspecified testis, unspecified whether descended or undescended: Secondary | ICD-10-CM

## 2015-10-04 LAB — CBC WITH DIFFERENTIAL/PLATELET
BASO%: 1.8 % (ref 0.0–2.0)
BASOS ABS: 0.1 10*3/uL (ref 0.0–0.1)
EOS ABS: 0.1 10*3/uL (ref 0.0–0.5)
EOS%: 2 % (ref 0.0–7.0)
HCT: 47 % (ref 38.4–49.9)
HGB: 15.8 g/dL (ref 13.0–17.1)
LYMPH#: 1 10*3/uL (ref 0.9–3.3)
LYMPH%: 24.9 % (ref 14.0–49.0)
MCH: 30.8 pg (ref 27.2–33.4)
MCHC: 33.6 g/dL (ref 32.0–36.0)
MCV: 91.7 fL (ref 79.3–98.0)
MONO#: 0.3 10*3/uL (ref 0.1–0.9)
MONO%: 7.2 % (ref 0.0–14.0)
NEUT#: 2.5 10*3/uL (ref 1.5–6.5)
NEUT%: 64.1 % (ref 39.0–75.0)
PLATELETS: 182 10*3/uL (ref 140–400)
RBC: 5.13 10*6/uL (ref 4.20–5.82)
RDW: 13.3 % (ref 11.0–14.6)
WBC: 4 10*3/uL (ref 4.0–10.3)

## 2015-10-04 LAB — COMPREHENSIVE METABOLIC PANEL
ALK PHOS: 44 U/L (ref 40–150)
ALT: 30 U/L (ref 0–55)
ANION GAP: 7 meq/L (ref 3–11)
AST: 20 U/L (ref 5–34)
Albumin: 4 g/dL (ref 3.5–5.0)
BUN: 12.5 mg/dL (ref 7.0–26.0)
CO2: 26 meq/L (ref 22–29)
Calcium: 9.5 mg/dL (ref 8.4–10.4)
Chloride: 106 mEq/L (ref 98–109)
Creatinine: 0.9 mg/dL (ref 0.7–1.3)
GLUCOSE: 100 mg/dL (ref 70–140)
POTASSIUM: 4.5 meq/L (ref 3.5–5.1)
SODIUM: 139 meq/L (ref 136–145)
Total Bilirubin: 0.47 mg/dL (ref 0.20–1.20)
Total Protein: 7 g/dL (ref 6.4–8.3)

## 2015-10-04 LAB — LACTATE DEHYDROGENASE: LDH: 170 U/L (ref 125–245)

## 2015-10-04 NOTE — Progress Notes (Signed)
Hematology and Oncology Follow Up Visit  Kevin Whiten [id number] [birth year] 45 y.o. 10/04/2015 10:00 AM    CC: Kevin Hess, M.D. Kevin Hess, M.D. 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  Prior Therapy:  1. Status post radical orchiectomy in August 2009.  Pathology revealing a 2.2-cm classic seminoma. 2. Received adjuvant radiation therapy, a total of 26 Gy in 16 fractions.  Therapy concluded in October 2009.  Current therapy: Observation and surveillance.  Interim History: Kevin Hess presents today for a followup visit. Since his last visit, he reports no major changes in his health. He remained reasonably active and perform activities of daily living without any decline. He does report some occasional neck discomfort associated with swallowing or yawning. He denied any masses, lumps or lesions. His weight have decreased slightly as he increase his exercise regularly. He denied any lymphadenopathy. He denied any genitourinary complaints. He remains in excellent health.   He has not reported any neurological symptoms such as headaches, blurry vision or syncope. He has not reported any fevers chills or sweats. Has not reported any abdominal pain. He is not reporting any early satiety or vomiting  He is not reporting any lymphadenopathy at this point.  He has not reported any frequency urgency or hesitancy.  Rest of his review of systems unremarkable.  Medications: I have reviewed the patient's current medications. Current Outpatient Prescriptions  Medication Sig Dispense Refill  . Cholecalciferol (VITAMIN D3) 5000 UNITS CAPS Take 5,000 Units by mouth daily. Takes Monday thru friday    . fish oil-omega-3 fatty acids 1000 MG capsule Take 1 g by mouth 2 (two) times daily.    . Hydrocortisone (CORTEF PO) Take 15 mg by mouth daily.    . Melatonin 1 MG CAPS Take 0.5  capsules by mouth at bedtime as needed.     . Multiple Vitamin (MULTIVITAMIN) tablet Take 1 tablet by mouth 2 (two) times daily.    . NON FORMULARY Take 1 tablet by mouth daily. probiotic    . Nutritional Supplements (DHEA PO) Take 35 mg by mouth daily.    [name] PHYLLANTHUS EMBLICA PO Take 1 tablet by mouth daily.    [name] testosterone cypionate (DEPOTESTOTERONE CYPIONATE) 100 MG/ML injection Inject 100 mg into the muscle once a week. For IM use only     No current facility-administered medications for this visit.     Allergies:  Allergies  Allergen Reactions  . Penicillins     Past Medical History, Surgical history, Social history, and Family History were reviewed and updated.  Physical Exam: Blood pressure 128/83, pulse 64, temperature 98.7 F (37.1 C), temperature source Oral, resp. rate 18, height 6' (1.829 m), weight 221 lb 11.2 oz (100.6 kg), SpO2 97 %. ECOG: 0 General appearance: Well-appearing gentleman without distress. Head: Normocephalic, without obvious abnormality no oral ulcers or lesions. Neck: no adenopathy. Lymph nodes: Cervical, supraclavicular, and axillary nodes normal. No inguinal adenopathy. Heart:regular rate and rhythm, S1, S2 normal, no murmur, click, rub or gallop Lung:chest clear, no wheezing, rales, normal symmetric air entry Abdomin: soft, non-tender, without masses or organomegaly EXT:no erythema, induration, or nodules Neurological exam: No motor or sensory deficits.   Lab Results: Lab Results  Component Value Date   WBC 4.0 10/04/2015   HGB 15.8 10/04/2015   HCT 47.0 10/04/2015   MCV 91.7 10/04/2015   PLT 182 10/04/2015  Chemistry      Component Value Date/Time   NA 138 10/03/2014 0852   K 4.7 10/03/2014 0852   CL 104 03/02/2012 1043   CO2 26 10/03/2014 0852   BUN 10.6 10/03/2014 0852   CREATININE 0.9 10/03/2014 0852      Component Value Date/Time   CALCIUM 9.4 10/03/2014 0852   ALKPHOS 38 (L) 10/03/2014 0852   AST 19 10/03/2014 0852    ALT 29 10/03/2014 0852   BILITOT 0.58 10/03/2014 0852       Results for Kevin Hess (MRN [id number]) as of 10/04/2015 10:03  Ref. Range 10/03/2014 08:52  AFP Tumor Marker Latest Ref Range: <6.1 ng/mL 3.2  Beta hCG, Tumor Marker Latest Ref Range: <5.0 mIU/mL < 2.0      Imession and Plan:  This is a 45 year old gentleman with the following issues. 1. Stage I seminoma status post orchiectomy followed by radiation therapy.  He continues to show no evidence of disease after completing therapy in 2009. The plan is to continue with active surveillance and continue laboratory testing, physical examination on annual basis. Imaging studies will be done as needed. 2. Testosterone deficiency: He is currently receiving testosterone supplements by his primary care provider. 3. Neck pain: Unclear etiology but appeared to be skeletal in nature. He is having a thyroid ultrasound to be done in the near future. 4. Age-appropriate cancer screening: She is up-to-date at this time.   Lorieann Argueta, MD 9/21/201710:00 AM

## 2015-10-04 NOTE — Telephone Encounter (Signed)
Avs report and appointment schedule given to patient, per 10/04/15 los. °

## 2015-10-05 LAB — AFP TUMOR MARKER: AFP, SERUM, TUMOR MARKER: 3 ng/mL (ref 0.0–8.3)

## 2015-10-05 LAB — BETA HCG QUANT (REF LAB)

## 2015-10-09 LAB — ALPHA FETO PROTEIN (PARALLEL TESTING): AFP TUMOR MARKER: 2.7 ng/mL (ref <6.1)

## 2015-10-09 LAB — BETA HCG, QN (PARALLEL TESTING)

## 2016-10-02 ENCOUNTER — Ambulatory Visit: Payer: BLUE CROSS/BLUE SHIELD | Admitting: Oncology

## 2016-10-02 ENCOUNTER — Other Ambulatory Visit: Payer: BLUE CROSS/BLUE SHIELD

## 2016-10-09 ENCOUNTER — Telehealth: Payer: Self-pay | Admitting: Oncology

## 2016-10-09 ENCOUNTER — Ambulatory Visit (HOSPITAL_BASED_OUTPATIENT_CLINIC_OR_DEPARTMENT_OTHER): Payer: BLUE CROSS/BLUE SHIELD | Admitting: Oncology

## 2016-10-09 ENCOUNTER — Other Ambulatory Visit (HOSPITAL_BASED_OUTPATIENT_CLINIC_OR_DEPARTMENT_OTHER): Payer: BLUE CROSS/BLUE SHIELD

## 2016-10-09 VITALS — BP 113/75 | HR 63 | Temp 98.7°F | Resp 19 | Ht 72.0 in | Wt 192.7 lb

## 2016-10-09 DIAGNOSIS — C6290 Malignant neoplasm of unspecified testis, unspecified whether descended or undescended: Secondary | ICD-10-CM | POA: Diagnosis not present

## 2016-10-09 LAB — COMPREHENSIVE METABOLIC PANEL
ALBUMIN: 4.3 g/dL (ref 3.5–5.0)
ALK PHOS: 55 U/L (ref 40–150)
ALT: 17 U/L (ref 0–55)
AST: 19 U/L (ref 5–34)
Anion Gap: 8 mEq/L (ref 3–11)
BILIRUBIN TOTAL: 0.42 mg/dL (ref 0.20–1.20)
BUN: 10.3 mg/dL (ref 7.0–26.0)
CO2: 27 mEq/L (ref 22–29)
CREATININE: 0.8 mg/dL (ref 0.7–1.3)
Calcium: 10.2 mg/dL (ref 8.4–10.4)
Chloride: 106 mEq/L (ref 98–109)
GLUCOSE: 98 mg/dL (ref 70–140)
Potassium: 4.3 mEq/L (ref 3.5–5.1)
Sodium: 140 mEq/L (ref 136–145)
TOTAL PROTEIN: 7 g/dL (ref 6.4–8.3)

## 2016-10-09 LAB — LACTATE DEHYDROGENASE: LDH: 138 U/L (ref 125–245)

## 2016-10-09 LAB — CBC WITH DIFFERENTIAL/PLATELET
BASO%: 0.8 % (ref 0.0–2.0)
Basophils Absolute: 0 10*3/uL (ref 0.0–0.1)
EOS ABS: 0 10*3/uL (ref 0.0–0.5)
EOS%: 0.6 % (ref 0.0–7.0)
HCT: 46.9 % (ref 38.4–49.9)
HEMOGLOBIN: 15.7 g/dL (ref 13.0–17.1)
LYMPH%: 28.2 % (ref 14.0–49.0)
MCH: 31.4 pg (ref 27.2–33.4)
MCHC: 33.5 g/dL (ref 32.0–36.0)
MCV: 93.7 fL (ref 79.3–98.0)
MONO#: 0.4 10*3/uL (ref 0.1–0.9)
MONO%: 8.9 % (ref 0.0–14.0)
NEUT%: 61.5 % (ref 39.0–75.0)
NEUTROS ABS: 2.7 10*3/uL (ref 1.5–6.5)
Platelets: 169 10*3/uL (ref 140–400)
RBC: 5.01 10*6/uL (ref 4.20–5.82)
RDW: 14.7 % — AB (ref 11.0–14.6)
WBC: 4.4 10*3/uL (ref 4.0–10.3)
lymph#: 1.2 10*3/uL (ref 0.9–3.3)

## 2016-10-09 NOTE — Progress Notes (Signed)
Hematology and Oncology Follow Up Visit  Kevin Hess [id number] December 18, 1970 46 y.o. 10/09/2016 2:53 PM    CC: Kevin Hess, M.D. Kevin Hess, M.D. 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  Prior Therapy:  1. Status post radical orchiectomy in August 2009.  Pathology revealing a 2.2-cm classic seminoma. 2. Received adjuvant radiation therapy, a total of 26 Gy in 16 fractions.  Therapy concluded in October 2009.  Current therapy: Observation and surveillance.  Interim History: Kevin Hess presents today for a followup visit. Since his last visit, he reports feeling well overall. He lost close to 30 pounds intentionally by diet modification and some exercise. He remained reasonably active and perform activities of daily living without any decline. He denied any masses, lumps or lesions.He denied any lymphadenopathy. He denied any genitourinary complaints. He denies any respiratory symptoms including cough or wheezing.   He has not reported any neurological symptoms such as headaches, blurry vision or syncope. He has not reported any fevers chills or sweats. Has not reported any abdominal pain. He is not reporting any early satiety or vomiting  He is not reporting any lymphadenopathy at this point.  He has not reported any frequency urgency or hesitancy.  Rest of his review of systems unremarkable.  Medications: I have reviewed the patient's current medications. Current Outpatient Prescriptions  Medication Sig Dispense Refill  . Cholecalciferol (VITAMIN D3) 5000 UNITS CAPS Take 5,000 Units by mouth daily. Takes Monday thru friday    . fish oil-omega-3 fatty acids 1000 MG capsule Take 1 g by mouth 2 (two) times daily.    . Hydrocortisone (CORTEF PO) Take 15 mg by mouth daily.    . Melatonin 1 MG CAPS Take 0.5 capsules by mouth at bedtime as needed.     . Multiple Vitamin  (MULTIVITAMIN) tablet Take 1 tablet by mouth 2 (two) times daily.    . NON FORMULARY Take 1 tablet by mouth daily. probiotic    . Nutritional Supplements (DHEA PO) Take 55 mg by mouth daily.     [name] testosterone cypionate (DEPOTESTOTERONE CYPIONATE) 100 MG/ML injection Inject 100 mg into the muscle once a week. For IM use only    . Zinc 25 MG TABS Take by mouth.     No current facility-administered medications for this visit.     Allergies:  Allergies  Allergen Reactions  . Penicillins     Past Medical History, Surgical history, Social history, and Family History were reviewed and updated.  Physical Exam: Blood pressure 113/75, pulse 63, temperature 98.7 F (37.1 C), temperature source Oral, resp. rate 19, height 6' (1.829 m), weight 192 lb 11.2 oz (87.4 kg), SpO2 96 %. ECOG: 0 General appearance: Alert, awake gentleman without distress. Head: Normocephalic, without obvious abnormality no oral thrush or ulcers. Neck: no adenopathy. Lymph nodes: Cervical, supraclavicular, and axillary nodes normal. No masses or lesions. Heart:regular rate and rhythm, S1, S2 normal, no murmur, click, rub or gallop Lung:chest clear, no wheezing, rales, normal symmetric air entry Abdomin: soft, non-tender, without masses or organomegaly no shifting dullness or ascites. EXT:no erythema, induration, or nodules Neurological exam: No motor or sensory deficits.   Lab Results: Lab Results  Component Value Date   WBC 4.4 10/09/2016   HGB 15.7 10/09/2016   HCT 46.9 10/09/2016   MCV 93.7 10/09/2016   PLT 169 10/09/2016     Chemistry  Component Value Date/Time   NA 139 10/04/2015 0926   K 4.5 10/04/2015 0926   CL 104 03/02/2012 1043   CO2 26 10/04/2015 0926   BUN 12.5 10/04/2015 0926   CREATININE 0.9 10/04/2015 0926      Component Value Date/Time   CALCIUM 9.5 10/04/2015 0926   ALKPHOS 44 10/04/2015 0926   AST 20 10/04/2015 0926   ALT 30 10/04/2015 0926   BILITOT 0.47 10/04/2015 0926       Results for Kevin Hess (MRN [id number]) as of 10/09/2016 14:55  Ref. Range 10/04/2015 09:26  AFP Tumor Marker Latest Ref Range: <6.1 ng/mL 2.7  AFP, Serum, Tumor Marker Latest Ref Range: 0.0 - 8.3 ng/mL 3.0  Beta hCG, Tumor Marker Latest Ref Range: <5.0 mIU/mL < 2.0  hCG Quant Latest Ref Range: 0 - 3 mIU/mL <1        Imession and Plan:   46 year old gentleman with the following issues. 1. Stage I seminoma status post orchiectomy followed by radiation therapy.  He continues to show no evidence of disease after completing therapy in 2009. His laboratory data and physical examination do not suggest recurrence. The plan is to continue with active surveillance on an annual basis indefinitely. He'll have physical examination and laboratory testing at that time. Imaging studies will be repeated if any symptoms developed. 2. Testosterone deficiency: He is currently receiving testosterone supplements by his primary care provider. 3. Age-appropriate cancer screening: I have recommended obtaining colonoscopy as the screening age has been reduced at this time. 4. Follow-up: Will be in one year.   Kevin Button, MD 9/27/20182:53 PM

## 2016-10-09 NOTE — Telephone Encounter (Signed)
Gave patient AVS and calendar of upcoming September 2019 appointments.  °

## 2016-10-10 LAB — BETA HCG QUANT (REF LAB)

## 2016-10-10 LAB — AFP TUMOR MARKER: AFP, Serum, Tumor Marker: 3.6 ng/mL (ref 0.0–8.3)

## 2017-05-27 ENCOUNTER — Other Ambulatory Visit: Payer: Self-pay | Admitting: Family Medicine

## 2017-05-27 DIAGNOSIS — E041 Nontoxic single thyroid nodule: Secondary | ICD-10-CM

## 2017-06-11 ENCOUNTER — Ambulatory Visit
Admission: RE | Admit: 2017-06-11 | Discharge: 2017-06-11 | Disposition: A | Discharge status: Home / Self Care | Payer: BLUE CROSS/BLUE SHIELD | Source: Ambulatory Visit | Attending: Family Medicine | Admitting: Family Medicine

## 2017-06-11 DIAGNOSIS — E041 Nontoxic single thyroid nodule: Secondary | ICD-10-CM

## 2017-10-08 ENCOUNTER — Inpatient Hospital Stay: Payer: BLUE CROSS/BLUE SHIELD | Attending: Oncology | Admitting: Oncology

## 2017-10-08 ENCOUNTER — Telehealth: Payer: Self-pay | Admitting: Oncology

## 2017-10-08 ENCOUNTER — Inpatient Hospital Stay: Payer: BLUE CROSS/BLUE SHIELD

## 2017-10-08 VITALS — BP 116/77 | HR 62 | Temp 98.4°F | Resp 17 | Ht 72.0 in | Wt 191.0 lb

## 2017-10-08 DIAGNOSIS — Z9079 Acquired absence of other genital organ(s): Secondary | ICD-10-CM

## 2017-10-08 DIAGNOSIS — Z923 Personal history of irradiation: Secondary | ICD-10-CM | POA: Diagnosis not present

## 2017-10-08 DIAGNOSIS — C6290 Malignant neoplasm of unspecified testis, unspecified whether descended or undescended: Secondary | ICD-10-CM

## 2017-10-08 DIAGNOSIS — C6291 Malignant neoplasm of right testis, unspecified whether descended or undescended: Secondary | ICD-10-CM | POA: Insufficient documentation

## 2017-10-08 LAB — COMPREHENSIVE METABOLIC PANEL
ALBUMIN: 4.2 g/dL (ref 3.5–5.0)
ALK PHOS: 41 U/L (ref 38–126)
ALT: 26 U/L (ref 0–44)
AST: 23 U/L (ref 15–41)
Anion gap: 8 (ref 5–15)
BILIRUBIN TOTAL: 0.3 mg/dL (ref 0.3–1.2)
BUN: 9 mg/dL (ref 6–20)
CALCIUM: 9.7 mg/dL (ref 8.9–10.3)
CO2: 26 mmol/L (ref 22–32)
CREATININE: 0.77 mg/dL (ref 0.61–1.24)
Chloride: 105 mmol/L (ref 98–111)
GFR calc Af Amer: >60 mL/min (ref >60)
GFR calc non Af Amer: >60 mL/min (ref >60)
GLUCOSE: 96 mg/dL (ref 70–99)
Potassium: 4.1 mmol/L (ref 3.5–5.1)
SODIUM: 139 mmol/L (ref 135–145)
Total Protein: 7.1 g/dL (ref 6.5–8.1)

## 2017-10-08 LAB — CBC WITH DIFFERENTIAL/PLATELET
BASOS PCT: 1 %
Basophils Absolute: 0 10*3/uL (ref 0.0–0.1)
EOS ABS: 0.1 10*3/uL (ref 0.0–0.5)
EOS PCT: 2 %
HEMATOCRIT: 44 % (ref 38.4–49.9)
HEMOGLOBIN: 14.7 g/dL (ref 13.0–17.1)
Lymphocytes Relative: 29 %
Lymphs Abs: 1.2 10*3/uL (ref 0.9–3.3)
MCH: 30.8 pg (ref 27.2–33.4)
MCHC: 33.4 g/dL (ref 32.0–36.0)
MCV: 92.2 fL (ref 79.3–98.0)
MONO ABS: 0.4 10*3/uL (ref 0.1–0.9)
MONOS PCT: 9 %
Neutro Abs: 2.4 10*3/uL (ref 1.5–6.5)
Neutrophils Relative %: 59 %
Platelets: 148 10*3/uL (ref 140–400)
RBC: 4.77 MIL/uL (ref 4.20–5.82)
RDW: 13.5 % (ref 11.0–14.6)
WBC: 4.1 10*3/uL (ref 4.0–10.3)

## 2017-10-08 LAB — LACTATE DEHYDROGENASE: LDH: 130 U/L (ref 98–192)

## 2017-10-08 NOTE — Progress Notes (Signed)
Hematology and Oncology Follow Up Visit  Salem Lembke [id number] 1970/09/05 47 y.o. 10/08/2017 1:27 PM    CC: [name] L. Rosana Hoes, M.D. Lora Paula, M.D. 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  He remains disease-free at this time.  Prior Therapy:  1. Status post radical orchiectomy in August 2009.  Pathology revealing a 2.2-cm classic seminoma. 2. Received adjuvant radiation therapy, a total of 26 Gy in 16 fractions.  Therapy concluded in October 2009.  Current therapy: Active surveillance  Interim History: Mr. Lento is here for a follow-up.  Since the last visit, he reports no major changes in his health.  He continues to watch his diet and exercise regularly for healthier lifestyle.  His activity level and performance status remain excellent.  He denies any testicular masses or discharge.  He is quality of life remained unchanged.   He has not reported any headaches, blurry vision or syncope.  He denies any syncope or alteration of mentation.  He has not reported any fevers chills or sweats. Has not reported any abdominal pain. He is not reporting any early satiety or vomiting.  Any changes in bowel habits.  He denies any frequency urgency or hesitancy.  He denies any easy bruising or bleeding.  He denies any changes in his mood.  Rest of his review of systems is negative.  Medications: I have reviewed the patient's current medications. Current Outpatient Medications  Medication Sig Dispense Refill  . Cholecalciferol (VITAMIN D3) 5000 UNITS CAPS Take 5,000 Units by mouth daily. Takes Monday thru friday    . fish oil-omega-3 fatty acids 1000 MG capsule Take 1 g by mouth 2 (two) times daily.    . Hydrocortisone (CORTEF PO) Take 15 mg by mouth daily.    . Melatonin 1 MG CAPS Take 0.5 capsules by mouth at bedtime as needed.     . Multiple Vitamin (MULTIVITAMIN) tablet Take 1 tablet by mouth 2 (two) times  daily.    . NON FORMULARY Take 1 tablet by mouth daily. probiotic    . Nutritional Supplements (DHEA PO) Take 55 mg by mouth daily.     [name] testosterone cypionate (DEPOTESTOTERONE CYPIONATE) 100 MG/ML injection Inject 100 mg into the muscle once a week. For IM use only    . Zinc 25 MG TABS Take by mouth.     No current facility-administered medications for this visit.     Allergies:  Allergies  Allergen Reactions  . Penicillins     Past Medical History, Surgical history, Social history, and Family History were reviewed and updated.  Physical Exam:   ECOG: 0   General appearance: Comfortable appearing without any discomfort Head: Normocephalic without any trauma Oropharynx: Mucous membranes are moist and pink without any thrush or ulcers. Eyes: Pupils are equal and round reactive to light. Lymph nodes: No cervical, supraclavicular, inguinal or axillary lymphadenopathy.   Heart:regular rate and rhythm.  S1 and S2 without leg edema. Lung: Clear without any rhonchi or wheezes.  No dullness to percussion. Abdomin: Soft, nontender, nondistended with good bowel sounds.  No hepatosplenomegaly. Musculoskeletal: No joint deformity or effusion.  Full range of motion noted. Neurological: No deficits noted on motor, sensory and deep tendon reflex exam. Skin: No petechial rash or dryness.  Appeared moist.    Lab Results: Lab Results  Component Value Date   WBC 4.4 10/09/2016   HGB 15.7 10/09/2016   HCT 46.9 10/09/2016  MCV 93.7 10/09/2016   PLT 169 10/09/2016     Chemistry      Component Value Date/Time   NA 140 10/09/2016 1428   K 4.3 10/09/2016 1428   CL 104 03/02/2012 1043   CO2 27 10/09/2016 1428   BUN 10.3 10/09/2016 1428   CREATININE 0.8 10/09/2016 1428      Component Value Date/Time   CALCIUM 10.2 10/09/2016 1428   ALKPHOS 55 10/09/2016 1428   AST 19 10/09/2016 1428   ALT 17 10/09/2016 1428   BILITOT 0.42 10/09/2016 1428             Imession and  Plan:   47 year old man with:  1.  T2N0 seminoma of the right testicle diagnosed in 2009.  Status post surgical resection without any evidence of recurrent disease after adjuvant radiation therapy.  The natural course of his disease was reviewed today and treatment options were reviewed.  I recommended continue annual surveillance at this timeWith routine laboratory testing and physical exam.  This can be discontinued at any time if he likes to continue this with his primary care physician.  For the time being we will continue annual visits per his request.  2.  Age-appropriate cancer screening: I encouraged he undergo colonoscopy given his previous exposure to radiation and slight increased risk of colon cancer.  3.  Follow-up: We will be in 12 months sooner if needed.  15  minutes was spent with the patient face-to-face today.  More than 50% of time was dedicated to discussing the natural course of his disease, risk of relapse as well as discussing healthy lifestyle and diet modification to improve his quality of life.     Zola Button, MD 9/26/20191:27 PM

## 2017-10-08 NOTE — Telephone Encounter (Signed)
Gave pt avs and calendar  °

## 2017-10-08 NOTE — Addendum Note (Signed)
Addended by: Randolm Idol on: 10/08/2017 02:18 PM   Modules accepted: Orders

## 2017-10-09 LAB — AFP TUMOR MARKER: AFP, SERUM, TUMOR MARKER: 2.6 ng/mL (ref 0.0–8.3)

## 2017-10-09 LAB — BETA HCG QUANT (REF LAB)

## 2018-01-13 DIAGNOSIS — N189 Chronic kidney disease, unspecified: Secondary | ICD-10-CM

## 2018-01-13 HISTORY — DX: Chronic kidney disease, unspecified: N18.9

## 2018-04-14 HISTORY — PX: EXTRACORPOREAL SHOCK WAVE LITHOTRIPSY: SHX1557

## 2018-06-28 DIAGNOSIS — N200 Calculus of kidney: Secondary | ICD-10-CM | POA: Diagnosis not present

## 2018-06-28 DIAGNOSIS — Z09 Encounter for follow-up examination after completed treatment for conditions other than malignant neoplasm: Secondary | ICD-10-CM | POA: Diagnosis not present

## 2018-09-28 DIAGNOSIS — Z125 Encounter for screening for malignant neoplasm of prostate: Secondary | ICD-10-CM | POA: Diagnosis not present

## 2018-09-28 DIAGNOSIS — Z0000 Encounter for general adult medical examination without abnormal findings: Secondary | ICD-10-CM | POA: Diagnosis not present

## 2018-09-29 DIAGNOSIS — N200 Calculus of kidney: Secondary | ICD-10-CM | POA: Diagnosis not present

## 2018-09-29 DIAGNOSIS — N132 Hydronephrosis with renal and ureteral calculous obstruction: Secondary | ICD-10-CM | POA: Diagnosis not present

## 2018-09-30 DIAGNOSIS — Z20828 Contact with and (suspected) exposure to other viral communicable diseases: Secondary | ICD-10-CM | POA: Diagnosis not present

## 2018-09-30 DIAGNOSIS — N200 Calculus of kidney: Secondary | ICD-10-CM | POA: Diagnosis not present

## 2018-09-30 DIAGNOSIS — Z01812 Encounter for preprocedural laboratory examination: Secondary | ICD-10-CM | POA: Diagnosis not present

## 2018-10-07 ENCOUNTER — Other Ambulatory Visit: Payer: Self-pay

## 2018-10-07 ENCOUNTER — Inpatient Hospital Stay: Payer: BC Managed Care – PPO | Attending: Oncology

## 2018-10-07 ENCOUNTER — Inpatient Hospital Stay (HOSPITAL_BASED_OUTPATIENT_CLINIC_OR_DEPARTMENT_OTHER): Payer: BC Managed Care – PPO | Admitting: Oncology

## 2018-10-07 VITALS — BP 111/81 | HR 66 | Temp 98.0°F | Resp 17 | Ht 72.0 in | Wt 196.9 lb

## 2018-10-07 DIAGNOSIS — Z923 Personal history of irradiation: Secondary | ICD-10-CM | POA: Diagnosis not present

## 2018-10-07 DIAGNOSIS — Z9079 Acquired absence of other genital organ(s): Secondary | ICD-10-CM | POA: Diagnosis not present

## 2018-10-07 DIAGNOSIS — Z79899 Other long term (current) drug therapy: Secondary | ICD-10-CM | POA: Insufficient documentation

## 2018-10-07 DIAGNOSIS — C6290 Malignant neoplasm of unspecified testis, unspecified whether descended or undescended: Secondary | ICD-10-CM | POA: Diagnosis not present

## 2018-10-07 DIAGNOSIS — Z8547 Personal history of malignant neoplasm of testis: Secondary | ICD-10-CM | POA: Diagnosis not present

## 2018-10-07 LAB — CMP (CANCER CENTER ONLY)
ALT: 14 U/L (ref 0–44)
AST: 16 U/L (ref 15–41)
Albumin: 4.5 g/dL (ref 3.5–5.0)
Alkaline Phosphatase: 43 U/L (ref 38–126)
Anion gap: 5 (ref 5–15)
BUN: 13 mg/dL (ref 6–20)
CO2: 27 mmol/L (ref 22–32)
Calcium: 9.3 mg/dL (ref 8.9–10.3)
Chloride: 105 mmol/L (ref 98–111)
Creatinine: 0.87 mg/dL (ref 0.61–1.24)
GFR, Est AFR Am: >60 mL/min (ref >60)
GFR, Estimated: >60 mL/min (ref >60)
Glucose, Bld: 101 mg/dL — ABNORMAL HIGH (ref 70–99)
Potassium: 4.2 mmol/L (ref 3.5–5.1)
Sodium: 137 mmol/L (ref 135–145)
Total Bilirubin: 0.3 mg/dL (ref 0.3–1.2)
Total Protein: 7.1 g/dL (ref 6.5–8.1)

## 2018-10-07 LAB — CBC WITH DIFFERENTIAL (CANCER CENTER ONLY)
Abs Immature Granulocytes: 0.01 10*3/uL (ref 0.00–0.07)
Basophils Absolute: 0 10*3/uL (ref 0.0–0.1)
Basophils Relative: 1 %
Eosinophils Absolute: 0 10*3/uL (ref 0.0–0.5)
Eosinophils Relative: 1 %
HCT: 44.3 % (ref 39.0–52.0)
Hemoglobin: 15 g/dL (ref 13.0–17.0)
Immature Granulocytes: 0 %
Lymphocytes Relative: 28 %
Lymphs Abs: 1.3 10*3/uL (ref 0.7–4.0)
MCH: 30.9 pg (ref 26.0–34.0)
MCHC: 33.9 g/dL (ref 30.0–36.0)
MCV: 91.2 fL (ref 80.0–100.0)
Monocytes Absolute: 0.4 10*3/uL (ref 0.1–1.0)
Monocytes Relative: 10 %
Neutro Abs: 2.8 10*3/uL (ref 1.7–7.7)
Neutrophils Relative %: 60 %
Platelet Count: 164 10*3/uL (ref 150–400)
RBC: 4.86 MIL/uL (ref 4.22–5.81)
RDW: 13 % (ref 11.5–15.5)
WBC Count: 4.7 10*3/uL (ref 4.0–10.5)
nRBC: 0 % (ref 0.0–0.2)

## 2018-10-07 NOTE — Progress Notes (Signed)
Hematology and Oncology Follow Up Visit  Kevin Hess [id number] 04-24-1970 48 y.o. 10/07/2018 12:59 PM    CC: Kevin Hess L. Rosana Hoes, M.D. Lora Paula, M.D. Kevin Lesch, DO    Principle Diagnosis: 48 year old man right testicular seminoma diagnosed in August 2009.  He was found to have T2 N0 and remains in remission at this time.   Prior Therapy:  1. Status post right radical orchiectomy with the pathology showed a 2.2 cm seminoma in August 2009.   2. He is status post adjuvant radiation therapy to the pelvis with a total of 26 Gy in 16 fractions concluded in October 2009.  Current therapy: Active surveillance  Interim History: Kevin Hess presents today for a repeat evaluation.  Since the last visit, he reports no major changes in his health.  He was diagnosed with kidney stones and has passed a number of them as of late.  He denies any recent pelvic pain or discomfort.  He denies any hematuria or dysuria.  His appetite and performance status remained excellent.  CT scan of the abdomen and pelvis completed on September 29, 2018 did not show any evidence of lymphadenopathy or malignancy.  He was found to have 9 mm stone in the left UVJ.  He is following with Dr. Rosana Hoes regarding this issue.  Patient denied any alteration mental status, neuropathy, confusion or dizziness.  Denies any headaches or lethargy.  Denies any night sweats, weight loss or changes in appetite.  Denied orthopnea, dyspnea on exertion or chest discomfort.  Denies shortness of breath, difficulty breathing hemoptysis or cough.  Denies any abdominal distention, nausea, early satiety or dyspepsia.  Denies any hematuria, frequency, dysuria or nocturia.  Denies any skin irritation, dryness or rash.  Denies any ecchymosis or petechiae.  Denies any lymphadenopathy or clotting.  Denies any heat or cold intolerance.  Denies any anxiety or depression.  Remaining review of system is negative.      Medications updated on  review.. Current Outpatient Medications  Medication Sig Dispense Refill  . Cholecalciferol (VITAMIN D3) 5000 UNITS CAPS Take 5,000 Units by mouth daily. Takes Monday thru friday    . cyanocobalamin 1000 MCG tablet Take 1,000 mcg by mouth daily.    [name] EC-RX TESTOSTERONE 0.2 % CREA Apply 200 mg/mL topically daily.    . fish oil-omega-3 fatty acids 1000 MG capsule Take 1 g by mouth 2 (two) times daily.     No current facility-administered medications for this visit.     Allergies:  Allergies  Allergen Reactions  . Penicillins     Past Medical History, Surgical history, Social history, and Family History unchanged on review.  Physical Exam: Blood pressure 111/81, pulse 66, temperature 98 F (36.7 C), temperature source Oral, resp. rate 17, height 6' (1.829 m), weight 196 lb 14.4 oz (89.3 kg), SpO2 98 %.    ECOG: 0    General appearance: Alert, awake without any distress. Head: Atraumatic without abnormalities Oropharynx: Without any thrush or ulcers. Eyes: No scleral icterus. Lymph nodes: No lymphadenopathy noted in the cervical, supraclavicular, or axillary nodes Heart:regular rate and rhythm, without any murmurs or gallops.   Lung: Clear to auscultation without any rhonchi, wheezes or dullness to percussion. Abdomin: Soft, nontender without any shifting dullness or ascites. Musculoskeletal: No clubbing or cyanosis. Neurological: No motor or sensory deficits. Skin: No rashes or lesions. Psychiatric: Mood and affect appeared normal.     Lab Results: Lab Results  Component Value Date   WBC 4.1 10/08/2017  HGB 14.7 10/08/2017   HCT 44.0 10/08/2017   MCV 92.2 10/08/2017   PLT 148 10/08/2017     Chemistry      Component Value Date/Time   NA 139 10/08/2017 1315   NA 140 10/09/2016 1428   K 4.1 10/08/2017 1315   K 4.3 10/09/2016 1428   CL 105 10/08/2017 1315   CL 104 03/02/2012 1043   CO2 26 10/08/2017 1315   CO2 27 10/09/2016 1428   BUN 9 10/08/2017 1315   BUN  10.3 10/09/2016 1428   CREATININE 0.77 10/08/2017 1315   CREATININE 0.8 10/09/2016 1428      Component Value Date/Time   CALCIUM 9.7 10/08/2017 1315   CALCIUM 10.2 10/09/2016 1428   ALKPHOS 41 10/08/2017 1315   ALKPHOS 55 10/09/2016 1428   AST 23 10/08/2017 1315   AST 19 10/09/2016 1428   ALT 26 10/08/2017 1315   ALT 17 10/09/2016 1428   BILITOT 0.3 10/08/2017 1315   BILITOT 0.42 10/09/2016 1428             Imession and Plan:   48 year old man with:  1.  Testicular cancer diagnosed in 2009.  At that time he was found to have T2N0 seminoma of the right testicle and remains disease-free after radical orchiectomy and adjuvant radiation.  He does not exhibit any signs or symptoms to suggest disease relapse at this time.  The natural course of this disease and risk of relapse was assessed again.  Laboratory data reviewed from today and continues to show no residual complications related therapy.  I continue to recommend annual visits for the time being without any imaging studies unless reason is indicated.  2.  Age-appropriate cancer screening and survivorship: He is CBC does not show any evidence of bone marrow involvement or damage.  Continues to emphasize the importance of prostate cancer as well as colon cancer screening.  He is considering colonoscopy this year.  3.  Follow-up: In 1 year for repeat evaluation.   15  minutes was spent with the patient face-to-face today.  More than 50% of time was spent on updating his disease status, discussing risk of relapse, management options for the future as well as answering questions regarding future plan of care.     Zola Button, MD 9/24/202012:59 PM

## 2018-10-13 DIAGNOSIS — N200 Calculus of kidney: Secondary | ICD-10-CM | POA: Diagnosis not present

## 2018-10-13 DIAGNOSIS — E291 Testicular hypofunction: Secondary | ICD-10-CM | POA: Diagnosis not present

## 2018-10-25 DIAGNOSIS — J309 Allergic rhinitis, unspecified: Secondary | ICD-10-CM | POA: Diagnosis not present

## 2018-10-25 DIAGNOSIS — Z0000 Encounter for general adult medical examination without abnormal findings: Secondary | ICD-10-CM | POA: Diagnosis not present

## 2018-10-25 DIAGNOSIS — D72819 Decreased white blood cell count, unspecified: Secondary | ICD-10-CM | POA: Diagnosis not present

## 2018-10-25 DIAGNOSIS — Z23 Encounter for immunization: Secondary | ICD-10-CM | POA: Diagnosis not present

## 2018-10-25 DIAGNOSIS — E7800 Pure hypercholesterolemia, unspecified: Secondary | ICD-10-CM | POA: Diagnosis not present

## 2018-10-25 DIAGNOSIS — E291 Testicular hypofunction: Secondary | ICD-10-CM | POA: Diagnosis not present

## 2018-12-06 DIAGNOSIS — E785 Hyperlipidemia, unspecified: Secondary | ICD-10-CM | POA: Diagnosis not present

## 2019-04-01 DIAGNOSIS — R972 Elevated prostate specific antigen [PSA]: Secondary | ICD-10-CM | POA: Diagnosis not present

## 2019-04-01 DIAGNOSIS — I781 Nevus, non-neoplastic: Secondary | ICD-10-CM | POA: Diagnosis not present

## 2019-04-01 DIAGNOSIS — E7800 Pure hypercholesterolemia, unspecified: Secondary | ICD-10-CM | POA: Diagnosis not present

## 2019-04-01 DIAGNOSIS — N5089 Other specified disorders of the male genital organs: Secondary | ICD-10-CM | POA: Diagnosis not present

## 2019-04-28 DIAGNOSIS — N200 Calculus of kidney: Secondary | ICD-10-CM | POA: Diagnosis not present

## 2019-04-28 DIAGNOSIS — Z87442 Personal history of urinary calculi: Secondary | ICD-10-CM | POA: Diagnosis not present

## 2019-04-28 DIAGNOSIS — Z8547 Personal history of malignant neoplasm of testis: Secondary | ICD-10-CM | POA: Diagnosis not present

## 2019-05-03 ENCOUNTER — Other Ambulatory Visit: Payer: Self-pay | Admitting: Urology

## 2019-05-03 DIAGNOSIS — C6290 Malignant neoplasm of unspecified testis, unspecified whether descended or undescended: Secondary | ICD-10-CM

## 2019-05-11 ENCOUNTER — Ambulatory Visit
Admission: RE | Admit: 2019-05-11 | Discharge: 2019-05-11 | Disposition: A | Discharge status: Home / Self Care | Payer: BC Managed Care – PPO | Source: Ambulatory Visit | Attending: Urology | Admitting: Urology

## 2019-05-11 DIAGNOSIS — C6290 Malignant neoplasm of unspecified testis, unspecified whether descended or undescended: Secondary | ICD-10-CM

## 2019-05-11 DIAGNOSIS — N5089 Other specified disorders of the male genital organs: Secondary | ICD-10-CM | POA: Diagnosis not present

## 2019-05-13 ENCOUNTER — Ambulatory Visit: Payer: BC Managed Care – PPO | Attending: Internal Medicine

## 2019-05-13 DIAGNOSIS — Z23 Encounter for immunization: Secondary | ICD-10-CM

## 2019-05-13 NOTE — Progress Notes (Signed)
   Covid-19 Vaccination Clinic  Name:  Arick Maples    MRN: [id number] DOB: 13-Aug-1970  05/13/2019  Mr. Verstraete was observed post Covid-19 immunization for 15 minutes without incident. He was provided with Vaccine Information Sheet and instruction to access the V-Safe system.   Mr. Slee was instructed to call 911 with any severe reactions post vaccine: [name] Difficulty breathing  . Swelling of face and throat  . A fast heartbeat  . A bad rash all over body  . Dizziness and weakness   Immunizations Administered    Name Date Dose VIS Date Route   Pfizer COVID-19 Vaccine 05/13/2019  8:43 AM 0.3 mL 03/09/2018 Intramuscular   Manufacturer: [place]   Lot: [id number]   [place]: [id number]

## 2019-05-17 DIAGNOSIS — C6290 Malignant neoplasm of unspecified testis, unspecified whether descended or undescended: Secondary | ICD-10-CM | POA: Diagnosis not present

## 2019-05-18 ENCOUNTER — Other Ambulatory Visit: Payer: Self-pay | Admitting: Urology

## 2019-05-20 DIAGNOSIS — R972 Elevated prostate specific antigen [PSA]: Secondary | ICD-10-CM | POA: Diagnosis not present

## 2019-05-20 DIAGNOSIS — E291 Testicular hypofunction: Secondary | ICD-10-CM | POA: Diagnosis not present

## 2019-05-20 DIAGNOSIS — E785 Hyperlipidemia, unspecified: Secondary | ICD-10-CM | POA: Diagnosis not present

## 2019-05-20 DIAGNOSIS — E7800 Pure hypercholesterolemia, unspecified: Secondary | ICD-10-CM | POA: Diagnosis not present

## 2019-05-23 DIAGNOSIS — C6290 Malignant neoplasm of unspecified testis, unspecified whether descended or undescended: Secondary | ICD-10-CM | POA: Diagnosis not present

## 2019-05-23 DIAGNOSIS — C6292 Malignant neoplasm of left testis, unspecified whether descended or undescended: Secondary | ICD-10-CM | POA: Diagnosis not present

## 2019-05-24 ENCOUNTER — Encounter: Payer: Self-pay | Admitting: Oncology

## 2019-05-25 ENCOUNTER — Encounter: Payer: Self-pay | Admitting: Oncology

## 2019-05-25 ENCOUNTER — Other Ambulatory Visit: Payer: Self-pay

## 2019-05-25 ENCOUNTER — Encounter (HOSPITAL_BASED_OUTPATIENT_CLINIC_OR_DEPARTMENT_OTHER): Payer: Self-pay | Admitting: Urology

## 2019-05-25 NOTE — Progress Notes (Signed)
Spoke w/ via phone for pre-op interview---patient Lab needs dos----    none           COVID test ------05-27-2019@1000  am Arrive at -------530 am 05-31-2019 NPO after ------midnight Medications to take morning of surgery -----flonase Diabetic medication -----n/a Patient Special Instructions -----none Pre-Op special Istructions -----none Patient verbalized understanding of instructions that were given at this phone interview. Patient denies shortness of breath, chest pain, fever, cough a this phone interview.

## 2019-05-27 ENCOUNTER — Other Ambulatory Visit (HOSPITAL_COMMUNITY): Payer: BC Managed Care – PPO

## 2019-05-27 ENCOUNTER — Other Ambulatory Visit (HOSPITAL_COMMUNITY)
Admission: RE | Admit: 2019-05-27 | Discharge: 2019-05-27 | Disposition: A | Discharge status: Home / Self Care | Payer: BC Managed Care – PPO | Source: Ambulatory Visit | Attending: Urology | Admitting: Urology

## 2019-05-27 DIAGNOSIS — Z20822 Contact with and (suspected) exposure to covid-19: Secondary | ICD-10-CM | POA: Insufficient documentation

## 2019-05-27 DIAGNOSIS — Z01812 Encounter for preprocedural laboratory examination: Secondary | ICD-10-CM | POA: Insufficient documentation

## 2019-05-27 LAB — SARS CORONAVIRUS 2 (TAT 6-24 HRS): SARS Coronavirus 2: NEGATIVE

## 2019-05-30 ENCOUNTER — Encounter: Payer: Self-pay | Admitting: Urology

## 2019-05-31 ENCOUNTER — Ambulatory Visit (HOSPITAL_BASED_OUTPATIENT_CLINIC_OR_DEPARTMENT_OTHER): Payer: BC Managed Care – PPO | Admitting: Anesthesiology

## 2019-05-31 ENCOUNTER — Ambulatory Visit (HOSPITAL_BASED_OUTPATIENT_CLINIC_OR_DEPARTMENT_OTHER)
Admission: RE | Admit: 2019-05-31 | Discharge: 2019-05-31 | Disposition: A | Discharge status: Home / Self Care | Payer: BC Managed Care – PPO | Attending: Urology | Admitting: Urology

## 2019-05-31 ENCOUNTER — Encounter (HOSPITAL_BASED_OUTPATIENT_CLINIC_OR_DEPARTMENT_OTHER): Payer: Self-pay | Admitting: Urology

## 2019-05-31 ENCOUNTER — Encounter (HOSPITAL_BASED_OUTPATIENT_CLINIC_OR_DEPARTMENT_OTHER)
Admission: RE | Disposition: A | Discharge status: Home / Self Care | Payer: Self-pay | Source: Home / Self Care | Attending: Urology

## 2019-05-31 DIAGNOSIS — Z79899 Other long term (current) drug therapy: Secondary | ICD-10-CM | POA: Diagnosis not present

## 2019-05-31 DIAGNOSIS — Z923 Personal history of irradiation: Secondary | ICD-10-CM | POA: Diagnosis not present

## 2019-05-31 DIAGNOSIS — C6292 Malignant neoplasm of left testis, unspecified whether descended or undescended: Secondary | ICD-10-CM | POA: Diagnosis not present

## 2019-05-31 DIAGNOSIS — N5089 Other specified disorders of the male genital organs: Secondary | ICD-10-CM | POA: Diagnosis not present

## 2019-05-31 DIAGNOSIS — Z8249 Family history of ischemic heart disease and other diseases of the circulatory system: Secondary | ICD-10-CM | POA: Insufficient documentation

## 2019-05-31 DIAGNOSIS — Z981 Arthrodesis status: Secondary | ICD-10-CM | POA: Diagnosis not present

## 2019-05-31 DIAGNOSIS — E7800 Pure hypercholesterolemia, unspecified: Secondary | ICD-10-CM | POA: Diagnosis not present

## 2019-05-31 DIAGNOSIS — Z8547 Personal history of malignant neoplasm of testis: Secondary | ICD-10-CM | POA: Diagnosis not present

## 2019-05-31 DIAGNOSIS — Z880 Allergy status to penicillin: Secondary | ICD-10-CM | POA: Insufficient documentation

## 2019-05-31 DIAGNOSIS — E785 Hyperlipidemia, unspecified: Secondary | ICD-10-CM | POA: Insufficient documentation

## 2019-05-31 DIAGNOSIS — Z8043 Family history of malignant neoplasm of testis: Secondary | ICD-10-CM | POA: Diagnosis not present

## 2019-05-31 DIAGNOSIS — Z87442 Personal history of urinary calculi: Secondary | ICD-10-CM | POA: Insufficient documentation

## 2019-05-31 HISTORY — DX: Other specified postprocedural states: R11.2

## 2019-05-31 HISTORY — DX: Personal history of urinary calculi: Z87.442

## 2019-05-31 HISTORY — DX: Other complications of anesthesia, initial encounter: T88.59XA

## 2019-05-31 HISTORY — PX: ORCHIECTOMY: SHX2116

## 2019-05-31 HISTORY — DX: Nausea with vomiting, unspecified: Z98.890

## 2019-05-31 SURGERY — ORCHIECTOMY
Anesthesia: General | Site: Groin | Laterality: Left

## 2019-05-31 MED ORDER — ONDANSETRON HCL 4 MG PO TABS: 4.0000 mg | ORAL_TABLET | Freq: Every day | ORAL | 1 refills | Status: DC | PRN

## 2019-05-31 MED ORDER — KETOROLAC TROMETHAMINE 30 MG/ML IJ SOLN
INTRAMUSCULAR | Status: DC | PRN
  Administered 2019-05-31: 30 mg via INTRAVENOUS

## 2019-05-31 MED ORDER — ONDANSETRON HCL 4 MG/2ML IJ SOLN: 4.0000 mg | Freq: Once | INTRAMUSCULAR | Status: DC | PRN

## 2019-05-31 MED ORDER — DEXAMETHASONE SODIUM PHOSPHATE 10 MG/ML IJ SOLN
INTRAMUSCULAR | Status: DC | PRN
  Administered 2019-05-31: 10 mg via INTRAVENOUS

## 2019-05-31 MED ORDER — PROPOFOL 500 MG/50ML IV EMUL
INTRAVENOUS | Status: AC
  Filled 2019-05-31: qty 50

## 2019-05-31 MED ORDER — OXYCODONE HCL 5 MG PO TABS
5.0000 mg | ORAL_TABLET | Freq: Once | ORAL | Status: AC | PRN
  Administered 2019-05-31: 5 mg via ORAL

## 2019-05-31 MED ORDER — PROPOFOL 10 MG/ML IV BOLUS
INTRAVENOUS | Status: DC | PRN
  Administered 2019-05-31: 200 mg via INTRAVENOUS

## 2019-05-31 MED ORDER — LIDOCAINE 2% (20 MG/ML) 5 ML SYRINGE
INTRAMUSCULAR | Status: AC
  Filled 2019-05-31: qty 5

## 2019-05-31 MED ORDER — ONDANSETRON HCL 4 MG/2ML IJ SOLN
INTRAMUSCULAR | Status: AC
  Filled 2019-05-31: qty 2

## 2019-05-31 MED ORDER — FENTANYL CITRATE (PF) 100 MCG/2ML IJ SOLN
INTRAMUSCULAR | Status: DC | PRN
  Administered 2019-05-31: 50 ug via INTRAVENOUS
  Administered 2019-05-31: 25 ug via INTRAVENOUS
  Administered 2019-05-31 (×2): 50 ug via INTRAVENOUS
  Administered 2019-05-31: 25 ug via INTRAVENOUS

## 2019-05-31 MED ORDER — CLINDAMYCIN PHOSPHATE 600 MG/50ML IV SOLN
INTRAVENOUS | Status: AC
  Filled 2019-05-31: qty 50

## 2019-05-31 MED ORDER — KETOROLAC TROMETHAMINE 30 MG/ML IJ SOLN
INTRAMUSCULAR | Status: AC
  Filled 2019-05-31: qty 1

## 2019-05-31 MED ORDER — MIDAZOLAM HCL 5 MG/5ML IJ SOLN
INTRAMUSCULAR | Status: DC | PRN
  Administered 2019-05-31: 2 mg via INTRAVENOUS

## 2019-05-31 MED ORDER — FENTANYL CITRATE (PF) 100 MCG/2ML IJ SOLN
INTRAMUSCULAR | Status: AC
  Filled 2019-05-31: qty 2

## 2019-05-31 MED ORDER — CLINDAMYCIN PHOSPHATE 600 MG/50ML IV SOLN
600.0000 mg | Freq: Once | INTRAVENOUS | Status: AC
  Administered 2019-05-31: 600 mg via INTRAVENOUS

## 2019-05-31 MED ORDER — LIDOCAINE 2% (20 MG/ML) 5 ML SYRINGE
INTRAMUSCULAR | Status: DC | PRN
  Administered 2019-05-31: 100 mg via INTRAVENOUS

## 2019-05-31 MED ORDER — BUPIVACAINE HCL (PF) 0.25 % IJ SOLN
INTRAMUSCULAR | Status: DC | PRN
  Administered 2019-05-31: 20 mL

## 2019-05-31 MED ORDER — OXYCODONE-ACETAMINOPHEN 5-325 MG PO TABS: 1.0000 | ORAL_TABLET | ORAL | 0 refills | Status: DC | PRN

## 2019-05-31 MED ORDER — MIDAZOLAM HCL 2 MG/2ML IJ SOLN
INTRAMUSCULAR | Status: AC
  Filled 2019-05-31: qty 2

## 2019-05-31 MED ORDER — EPHEDRINE 5 MG/ML INJ
INTRAVENOUS | Status: AC
  Filled 2019-05-31: qty 10

## 2019-05-31 MED ORDER — ONDANSETRON HCL 4 MG/2ML IJ SOLN
INTRAMUSCULAR | Status: DC | PRN
  Administered 2019-05-31: 4 mg via INTRAVENOUS

## 2019-05-31 MED ORDER — FENTANYL CITRATE (PF) 100 MCG/2ML IJ SOLN: 25.0000 ug | INTRAMUSCULAR | Status: DC | PRN

## 2019-05-31 MED ORDER — OXYCODONE HCL 5 MG/5ML PO SOLN: 5.0000 mg | Freq: Once | ORAL | Status: AC | PRN

## 2019-05-31 MED ORDER — LACTATED RINGERS IV SOLN: INTRAVENOUS | Status: DC

## 2019-05-31 MED ORDER — OXYCODONE HCL 5 MG PO TABS
ORAL_TABLET | ORAL | Status: AC
  Filled 2019-05-31: qty 1

## 2019-05-31 MED ORDER — DEXAMETHASONE SODIUM PHOSPHATE 10 MG/ML IJ SOLN
INTRAMUSCULAR | Status: AC
  Filled 2019-05-31: qty 1

## 2019-05-31 SURGICAL SUPPLY — 41 items
BLADE HEX COATED 2.75 (ELECTRODE) ×3 IMPLANT
BLADE SURG 15 STRL LF DISP TIS (BLADE) ×1 IMPLANT
BLADE SURG 15 STRL SS (BLADE) ×2
BNDG GAUZE ELAST 4 BULKY (GAUZE/BANDAGES/DRESSINGS) ×3 IMPLANT
COVER BACK TABLE 60X90IN (DRAPES) ×3 IMPLANT
COVER MAYO STAND STRL (DRAPES) ×3 IMPLANT
COVER WAND RF STERILE (DRAPES) ×3 IMPLANT
DERMABOND ADVANCED (GAUZE/BANDAGES/DRESSINGS) ×2
DERMABOND ADVANCED .7 DNX12 (GAUZE/BANDAGES/DRESSINGS) ×1 IMPLANT
DISSECTOR ROUND CHERRY 3/8 STR (MISCELLANEOUS) ×3 IMPLANT
DRAIN PENROSE 0.5X18 (DRAIN) ×3 IMPLANT
DRAPE LAPAROTOMY 100X72 PEDS (DRAPES) ×3 IMPLANT
ELECT REM PT RETURN 9FT ADLT (ELECTROSURGICAL) ×3
ELECTRODE REM PT RTRN 9FT ADLT (ELECTROSURGICAL) ×1 IMPLANT
GLOVE BIOGEL M STRL SZ7.5 (GLOVE) ×3 IMPLANT
GLOVE BIOGEL PI IND STRL 7.5 (GLOVE) ×1 IMPLANT
GLOVE BIOGEL PI INDICATOR 7.5 (GLOVE) ×2
GOWN STRL REUS W/TWL XL LVL3 (GOWN DISPOSABLE) ×3 IMPLANT
NEEDLE HYPO 22GX1.5 SAFETY (NEEDLE) IMPLANT
NS IRRIG 500ML POUR BTL (IV SOLUTION) ×3 IMPLANT
PENCIL BUTTON HOLSTER BLD 10FT (ELECTRODE) ×3 IMPLANT
SET BASIN DAY SURGERY F.S. (CUSTOM PROCEDURE TRAY) ×3 IMPLANT
SUPPORT SCROTAL LG STRP (MISCELLANEOUS) ×2 IMPLANT
SUPPORTER ATHLETIC LG (MISCELLANEOUS) ×1
SUT CHROMIC 3 0 SH 27 (SUTURE) ×3 IMPLANT
SUT MNCRL AB 3-0 PS2 18 (SUTURE) IMPLANT
SUT MNCRL AB 4-0 PS2 18 (SUTURE) ×3 IMPLANT
SUT SILK 2 0 PERMA HAND 18 BK (SUTURE) IMPLANT
SUT SILK 2 0 SH (SUTURE) ×6 IMPLANT
SUT VIC AB 2-0 SH 27 (SUTURE)
SUT VIC AB 2-0 SH 27XBRD (SUTURE) IMPLANT
SUT VIC AB 2-0 UR5 27 (SUTURE) IMPLANT
SUT VIC AB 2-0 UR6 27 (SUTURE) ×3 IMPLANT
SUT VIC AB 4-0 PS2 18 (SUTURE) IMPLANT
SUT VICRYL 0 TIES 12 18 (SUTURE) IMPLANT
SYR CONTROL 10ML LL (SYRINGE) IMPLANT
TOWEL OR 17X26 10 PK STRL BLUE (TOWEL DISPOSABLE) ×3 IMPLANT
TRAY DSU PREP LF (CUSTOM PROCEDURE TRAY) ×3 IMPLANT
TUBE CONNECTING 12'X1/4 (SUCTIONS) ×1
TUBE CONNECTING 12X1/4 (SUCTIONS) ×2 IMPLANT
YANKAUER SUCT BULB TIP NO VENT (SUCTIONS) ×3 IMPLANT

## 2019-05-31 NOTE — Anesthesia Postprocedure Evaluation (Signed)
Anesthesia Post Note  Patient: Teodora Medici  Procedure(s) Performed: ORCHIECTOMY, RADICAL (Left Groin)     Patient location during evaluation: PACU Anesthesia Type: General Level of consciousness: awake and alert Pain management: pain level controlled Vital Signs Assessment: post-procedure vital signs reviewed and stable Respiratory status: spontaneous breathing, nonlabored ventilation and respiratory function stable Cardiovascular status: blood pressure returned to baseline and stable Postop Assessment: no apparent nausea or vomiting Anesthetic complications: no    Last Vitals:  Vitals:   05/31/19 0900 05/31/19 0915  BP: 118/81 122/80  Pulse: 78 70  Resp: 12 16  Temp:  36.6 C  SpO2: 97% 98%    Last Pain:  Vitals:   05/31/19 0941  TempSrc:   PainSc: 3                  Lidia Collum

## 2019-05-31 NOTE — Anesthesia Preprocedure Evaluation (Signed)
Anesthesia Evaluation  Patient identified by MRN, date of birth, ID band Patient awake    Reviewed: Allergy & Precautions, NPO status , Patient's Chart, lab work & pertinent test results  History of Anesthesia Complications (+) PONVNegative for: history of anesthetic complications  Airway Mallampati: II  TM Distance: >3 FB Neck ROM: Full    Dental  (+) Teeth Intact   Pulmonary neg pulmonary ROS,    Pulmonary exam normal        Cardiovascular Normal cardiovascular exam  HLD   Neuro/Psych negative neurological ROS  negative psych ROS   GI/Hepatic negative GI ROS, Neg liver ROS,   Endo/Other  negative endocrine ROS  Renal/GU negative Renal ROS   Testicular mass    Musculoskeletal negative musculoskeletal ROS (+)   Abdominal   Peds  Hematology negative hematology ROS (+)   Anesthesia Other Findings   Reproductive/Obstetrics                            Anesthesia Physical Anesthesia Plan  ASA: II  Anesthesia Plan: General   Post-op Pain Management:    Induction: Intravenous  PONV Risk Score and Plan: 3 and Ondansetron, Dexamethasone, Midazolam and Treatment may vary due to age or medical condition  Airway Management Planned: LMA  Additional Equipment: None  Intra-op Plan:   Post-operative Plan: Extubation in OR  Informed Consent: I have reviewed the patients History and Physical, chart, labs and discussed the procedure including the risks, benefits and alternatives for the proposed anesthesia with the patient or authorized representative who has indicated his/her understanding and acceptance.     Dental advisory given  Plan Discussed with:   Anesthesia Plan Comments:         Anesthesia Quick Evaluation

## 2019-05-31 NOTE — Discharge Instructions (Signed)
  Post Anesthesia Home Care Instructions  Activity: Get plenty of rest for the remainder of the day. A responsible individual must stay with you for 24 hours following the procedure.  For the next 24 hours, DO NOT: -Drive a car -Paediatric nurse -Drink alcoholic beverages -Take any medication unless instructed by your physician -Make any legal decisions or sign important papers.  Meals: Start with liquid foods such as gelatin or soup. Progress to regular foods as tolerated. Avoid greasy, spicy, heavy foods. If nausea and/or vomiting occur, drink only clear liquids until the nausea and/or vomiting subsides. Call your physician if vomiting continues.  Special Instructions/Symptoms: Your throat may feel dry or sore from the anesthesia or the breathing tube placed in your throat during surgery. If this causes discomfort, gargle with warm salt water. The discomfort should disappear within 24 hours.  No ibuprofen/Motrin/Advil products prior to 2:30pm 05/31/19.

## 2019-05-31 NOTE — Op Note (Signed)
Operative Note  Preoperative diagnosis:  1.  2.8 cm left testicular mass 2.  History of seminoma involving the right testicle  Postoperative diagnosis: 1.  2.8 cm left testicular mass 2.  History of seminoma involving the right testicle  Procedure(s): 1.  Left inguinal orchiectomy  Surgeon: Ellison Hughs, MD  Assistants:  None  Anesthesia:  General  Complications:  None  EBL: 20 mL  Specimens: 1.  Left testicle and spermatic cord  Drains/Catheters: 1.  None  Intraoperative findings:   1. The left spermatic cord was hemostatic following suture ligation  Indication:  Kevin Hess is a 49 y.o. male with a 2.8 cm heterogenous lesion with features concerning for testis cancer seen on scrotal ultrasound from 05/11/2019.  He has a prior history of seminoma involving the right testicle and is status post right orchiectomy with subsequent external beam radiation in 2009.  He has been consented for the above procedures, voices understanding and wishes to proceed.  Description of procedure:  After informed consent was obtained, the patient was brought to the operating room and general anesthesia was administered.  The patient was placed in the supine position and prepped and draped in usual sterile fashion.  A timeout was then performed.    A 5 cm left inguinal incision was made and the overlying adipose tissue was incised using electrocautery until the external oblique aponeurosis was identified.  Starting at the left external inguinal ring, the external oblique aponeurosis was incised cephalad approximately 5 cm and the left spermatic cord was isolated.  Care was taken to preserve the left ilioinguinal nerve.  A Penrose drain was wrapped around the most proximal portion of the spermatic cord to act as a tourniquet.    The left testicle was then delivered from the left hemiscrotum, using a combination of blunt dissection and electrocautery.  Once the left testicle and spermatic cord  were freely mobile, the spermatic cord was bisected and clamped.  Heavy scissors were then used to incise the left spermatic cord.  The left testicle and spermatic cord were then sent off to pathology for permanent section.  2-0 silk sutures were then used to ligate the remaining spermatic cord stumps, which were sent hemostatic following suture ligation.  The external oblique aponeurosis was then reapproximated using a running 2-0 Vicryl suture.    Cord percent Marcaine without epinephrine was then injected around the incision.  The incision was then closed in 2 layers and dressed with Dermabond.  Scrotal support was then applied.  The patient tolerated the procedure well and was transferred to the postanesthesia in stable condition.  Plan: Follow-up in 1 week to discuss pathology results

## 2019-05-31 NOTE — H&P (Signed)
PRE-OP H&P  Kevin Hess  MRN: [id number]  DOB: Jul 19, 1970, 49 year old Male   PRIMARY CARE:  Marylene Land, MD  REFERRING:  Marylene Land, MD  PROVIDER:  Ellison Hughs, M.D.  LOCATION:  [facility] [phone]     CC/HPI: Left testicular lesion   Kevin Hess is a 49 year old male with a history of seminoma status post right orchiectomy in 2009 with subsequent XRT completed in 10/2007, hypogonadism and kidney stones.   05/17/19: The patient is here today for expedited follow-up after he was found to have a 2.8 cm heterogenous lesion on involving his left testicle on scrotal ultrasound from 05/11/2019. He denies any significant testicular pain or urinary symptoms since his last office visit.     ALLERGIES: Penicillins    MEDICATIONS: Flonase Allergy Relief  Rosuvastatin Calcium 10 mg tablet     GU PSH: Cysto Uretero Lithotripsy Radical Orchiectomy - 2009 Vasectomy  PSH Notes: Radical Orchiectomy Right, Thoracic Fusion    NON-GU PSH: No Non-GU PSH    GU PMH: History of urolithiasis - 04/28/2019 Encounter for Prostate Cancer screening, Prostate cancer screening - 2014 Personal Hx Oth male genital organs diseases, History of testicular mass - 2014 Testicular Cancer, Unspec, Seminoma of testis, unspecified laterality - 2014      PMH Notes:  2007-08-26 18:38:20 - Note: Normal Routine History And Physical Adult  2007-10-21 08:57:43 - Note: Cellulitis   NON-GU PMH: Hypercholesterolemia    FAMILY HISTORY: Coronary Artery Disease - Father, Mother Family Health Status - Father alive at age 100 - 87 In Family Family Health Status - Mother's Age - Runs In Family Family Health Status Number - Runs In Family Hypertension - Father, Mother Pure Hypercholesterolemia - Mother, Father testicular cancer - Brother, Brother   SOCIAL HISTORY: Marital Status: Married Preferred Language: English; Race: White Current Smoking Status: Patient has never smoked.    Tobacco Use Assessment Completed: Used Tobacco in last 30 days? Has never drank.  Drinks 2 caffeinated drinks per day.     Notes: Previous History Of Smoking, Alcohol Use, Caffeine Use, Marital History - Currently Married, Occupation:   REVIEW OF SYSTEMS:    GU Review Male:   Patient denies frequent urination, hard to postpone urination, burning/ pain with urination, get up at night to urinate, leakage of urine, stream starts and stops, trouble starting your stream, have to strain to urinate , erection problems, and penile pain.  Gastrointestinal (Upper):   Patient denies nausea, vomiting, and indigestion/ heartburn.  Gastrointestinal (Lower):   Patient denies diarrhea and constipation.  Constitutional:   Patient denies fever, night sweats, weight loss, and fatigue.  Skin:   Patient denies skin rash/ lesion and itching.  Eyes:   Patient denies blurred vision and double vision.  Ears/ Nose/ Throat:   Patient denies sore throat and sinus problems.  Hematologic/Lymphatic:   Patient denies swollen glands and easy bruising.  Cardiovascular:   Patient denies leg swelling and chest pains.  Respiratory:   Patient denies cough and shortness of breath.  Endocrine:   Patient denies excessive thirst.  Musculoskeletal:   Patient denies back pain and joint pain.  Neurological:   Patient denies headaches and dizziness.  Psychologic:   Patient denies depression and anxiety.   VITAL SIGNS:      05/17/2019 09:29 AM  Weight 182 lb / 82.55 kg  Height 72 in / 182.88 cm  BP 128/80 mmHg  Heart Rate 62 /min  Temperature 98.0 F /  36.6 C  BMI 24.7 kg/m   MULTI-SYSTEM PHYSICAL EXAMINATION:    Constitutional: Well-nourished. No physical deformities. Normally developed. Good grooming.  Skin: No paleness, no jaundice, no cyanosis. No lesion, no ulcer, no rash.  Musculoskeletal: Normal gait and station of head and neck.     Complexity of Data:  X-Ray Review: Outside Ultrasound: Reviewed Films. Reviewed  Report. Discussed With Patient.     08/13/07  PSA  Total PSA 0.27    Notes:                     CLINICAL DATA: Left testicular mass.     EXAM:  SCROTAL ULTRASOUND     DOPPLER ULTRASOUND OF THE TESTICLES     TECHNIQUE:  Complete ultrasound examination of the testicles, epididymis, and  other scrotal structures was performed. Color and spectral Doppler  ultrasound were also utilized to evaluate blood flow to the  testicles.     COMPARISON: August 12, 2007.     FINDINGS:  Status post right orchiectomy.     Left testicle     Measurements: 3.9 x 2.6 x 1.6 cm. 2.8 x 1.8 x 1.2 cm mass with  vascularity is noted in the left testicle consistent with testicular  neoplasm or malignancy.     Right epididymis: Surgically removed.     Left epididymis: 6 mm cyst is noted.     Hydrocele: None visualized.     Varicocele: None visualized.     Pulsed Doppler interrogation of left testicle demonstrates normal  low resistance arterial and venous waveforms.     IMPRESSION:  Status post right orchiectomy reportedly for malignancy. 2.8 cm  solid mass with increased vascularity is noted in the left testicle  concerning for neoplasm or malignancy. Consultation with urology is  recommended. These results will be called to the ordering clinician  or representative by the Radiologist Assistant, and communication  documented in the PACS or zVision Dashboard.        Electronically Signed  By: Marijo Conception M.D.  On: 05/12/2019 08:35   PROCEDURES:          Urinalysis w/Scope Micro  WBC/hpf: 0 - 5/hpf  RBC/hpf: 0 - 2/hpf  Bacteria: NS (Not Seen)  Cystals: NS (Not Seen)  Casts: NS (Not Seen)  Trichomonas: Not Present  Mucous: Not Present  Epithelial Cells: 0 - 5/hpf  Yeast: NS (Not Seen)  Sperm: Not Present    ASSESSMENT:      ICD-10 Details  1 GU:   Testicular Cancer, Unspec - C62.90 Left, Undiagnosed New Problem   PLAN:           Orders Labs Beta HCG, LDH, Alpha  Fetoprotein (AFP), BMP          Schedule X-Rays: 1 Week - C.T. Chest/ Abd/Pelvis With I.V. Contrast  Return Visit/Planned Activity: ASAP - Schedule Surgery          Document Letter(s):  Created for Marylene Land, MD   Created for Patient: Clinical Summary         Notes:   -Scrotal US results discussed with the patient and his wife. The lesion in question is highly concerning for testis cancer.  -Tumor markers and staging CT chest/abdomen/pelvis pending  -The risk, benefits and alternatives of left radical inguinal orchiectomy was discussed in detail. Risks include, but are not limited to, bleeding, infection, chronic pain, MI, CVA, DVT, PE and the inherent risk of general anesthesia. He voices understanding and wishes  to proceed.

## 2019-05-31 NOTE — Transfer of Care (Signed)
Immediate Anesthesia Transfer of Care Note  Patient: Kevin Hess  Procedure(s) Performed: ORCHIECTOMY, RADICAL (Left Groin)  Patient Location: PACU  Anesthesia Type:General  Level of Consciousness: awake, alert  and oriented  Airway & Oxygen Therapy: Patient Spontanous Breathing and Patient connected to face mask oxygen  Post-op Assessment: Report given to RN and Post -op Vital signs reviewed and stable  Post vital signs: Reviewed and stable  Last Vitals:  Vitals Value Taken Time  BP 115/81   Temp    Pulse 77 05/31/19 0841  Resp 13 05/31/19 0841  SpO2 100 % 05/31/19 0841  Vitals shown include unvalidated device data.  Last Pain:  Vitals:   05/31/19 0558  TempSrc: Oral  PainSc: 0-No pain      Patients Stated Pain Goal: 4 ([id number])  Complications: No apparent anesthesia complications

## 2019-05-31 NOTE — Anesthesia Procedure Notes (Signed)
Procedure Name: LMA Insertion Date/Time: 05/31/2019 7:33 AM Performed by: Genelle Bal, CRNA Pre-anesthesia Checklist: Patient identified, Emergency Drugs available, Suction available and Patient being monitored Patient Re-evaluated:Patient Re-evaluated prior to induction Oxygen Delivery Method: Circle system utilized Preoxygenation: Pre-oxygenation with 100% oxygen Induction Type: IV induction Ventilation: Mask ventilation without difficulty LMA: LMA inserted LMA Size: 4.0 Number of attempts: 1 Airway Equipment and Method: Bite block Placement Confirmation: positive ETCO2 Tube secured with: Tape Dental Injury: Teeth and Oropharynx as per pre-operative assessment

## 2019-06-01 LAB — SURGICAL PATHOLOGY

## 2019-06-06 ENCOUNTER — Encounter: Payer: Self-pay | Admitting: Oncology

## 2019-06-06 ENCOUNTER — Ambulatory Visit: Payer: BC Managed Care – PPO

## 2019-06-06 DIAGNOSIS — C6292 Malignant neoplasm of left testis, unspecified whether descended or undescended: Secondary | ICD-10-CM | POA: Diagnosis not present

## 2019-06-06 DIAGNOSIS — C6211 Malignant neoplasm of descended right testis: Secondary | ICD-10-CM | POA: Diagnosis not present

## 2019-06-06 DIAGNOSIS — I712 Thoracic aortic aneurysm, without rupture: Secondary | ICD-10-CM | POA: Diagnosis not present

## 2019-06-06 DIAGNOSIS — E291 Testicular hypofunction: Secondary | ICD-10-CM | POA: Diagnosis not present

## 2019-06-14 ENCOUNTER — Ambulatory Visit: Payer: BC Managed Care – PPO | Attending: Internal Medicine

## 2019-06-14 ENCOUNTER — Telehealth: Payer: Self-pay | Admitting: Oncology

## 2019-06-14 DIAGNOSIS — Z23 Encounter for immunization: Secondary | ICD-10-CM

## 2019-06-14 NOTE — Progress Notes (Signed)
   Covid-19 Vaccination Clinic  Name:  Kevin Hess    MRN: [id number] DOB: 1970/10/25  06/14/2019  [name] was observed post Covid-19 immunization for 15 minutes without incident. He was provided with Vaccine Information Sheet and instruction to access the V-Safe system.   [name] was instructed to call 911 with any severe reactions post vaccine: [name] Difficulty breathing  . Swelling of face and throat  . A fast heartbeat  . A bad rash all over body  . Dizziness and weakness   Immunizations Administered    Name Date Dose VIS Date Route   Pfizer COVID-19 Vaccine 06/14/2019  9:14 AM 0.3 mL 03/09/2018 Intramuscular   Manufacturer: [employer], [employer]   Lot: [id number]   [place]: [id number]

## 2019-06-14 NOTE — Telephone Encounter (Signed)
Scheduled appt per 6/1 sch message - unable to reach pt . Left message with appt date and time

## 2019-06-21 ENCOUNTER — Other Ambulatory Visit: Payer: Self-pay

## 2019-06-21 DIAGNOSIS — I7121 Aneurysm of the ascending aorta, without rupture: Secondary | ICD-10-CM

## 2019-06-22 ENCOUNTER — Institutional Professional Consult (permissible substitution) (INDEPENDENT_AMBULATORY_CARE_PROVIDER_SITE_OTHER): Payer: BC Managed Care – PPO | Admitting: Surgery

## 2019-06-22 ENCOUNTER — Encounter: Payer: Self-pay | Admitting: Surgery

## 2019-06-22 ENCOUNTER — Other Ambulatory Visit: Payer: Self-pay

## 2019-06-22 VITALS — BP 126/81 | HR 78 | Temp 98.8°F | Resp 16 | Ht 72.0 in | Wt 192.0 lb

## 2019-06-22 DIAGNOSIS — I712 Thoracic aortic aneurysm, without rupture: Secondary | ICD-10-CM

## 2019-06-22 DIAGNOSIS — I7121 Aneurysm of the ascending aorta, without rupture: Secondary | ICD-10-CM

## 2019-06-22 NOTE — Progress Notes (Signed)
Cardiothoracic Surgery Consultation  PCP is Derinda Late, MD Referring Provider is Davis Gourd*  Chief Complaint  Patient presents with  . Thoracic Aortic Aneurysm    Surgical eval, CT Chest/ABD/Pelvis 05/23/19 -in PAC's    HPI:  The patient is a 49 year old gentleman with a history of testicular seminoma status post right orchiectomy in 2009 with subsequent XRT and recent left orchiectomy for 2.8 cm left testicular mass on 05/31/2019.  Preoperative CT scan of the chest showed a 4.3 cm fusiform ascending aortic aneurysm.  There is no family history of connective tissue disorder, aortic aneurysm, or aortic dissection.  Past Medical History:  Diagnosis Date  . Complication of anesthesia   . History of blood transfusion 1986  . History of kidney stones   . History of radiation therapy 2009   right testicular cancer  . Malignant neoplasm of other and unspecified testis 2009   testicular right  . Mononucleosis   . PONV (postoperative nausea and vomiting)   . Scoliosis     Past Surgical History:  Procedure Laterality Date  . [place]   due to scoliosis 1986 Thoracic fusion 1997 thoracici hardware removal  . EXTRACORPOREAL SHOCK WAVE LITHOTRIPSY  04/2018  . left vastectomy  2011   with local  . ORCHIECTOMY Left 05/31/2019   Procedure: ORCHIECTOMY, RADICAL;  Surgeon: Ceasar Mons, MD;  Location: [facility];  Service: Urology;  Laterality: Left;  . right radical ocrhiectomy  2009  . UPPER GI ENDOSCOPY  2010    History reviewed. No pertinent family history.  Social History Social History   Tobacco Use  . Smoking status: Never Smoker  . Smokeless tobacco: Never Used  Substance Use Topics  . Alcohol use: Never  . Drug use: Never    Current Outpatient Medications  Medication Sig Dispense Refill  . EC-RX TESTOSTERONE 0.2 % CREA Apply 200 mg/mL topically daily.    . fluticasone (FLONASE) 50 MCG/ACT nasal spray  Place 2 sprays into both nostrils daily.    . rosuvastatin (CRESTOR) 10 MG tablet Take 10 mg by mouth daily.    . Cholecalciferol (VITAMIN D3) 5000 UNITS CAPS Take 5,000 Units by mouth daily. Takes Monday thru friday    . fish oil-omega-3 fatty acids 1000 MG capsule Take 1 g by mouth 2 (two) times daily.     No current facility-administered medications for this visit.    Allergies  Allergen Reactions  . Penicillins     Rash age 58    Review of Systems  Respiratory: Negative for shortness of breath.   Cardiovascular: Negative for chest pain.    BP 126/81 (BP Location: Left Arm, Patient Position: Sitting, Cuff Size: Normal)   Pulse 78   Temp 98.8 F (37.1 C) (Temporal)   Resp 16   Ht 6' (1.829 m)   Wt 192 lb (87.1 kg)   SpO2 95% Comment: RA  BMI 26.04 kg/m  Physical Exam Constitutional:      Appearance: Normal appearance.  Cardiovascular:     Rate and Rhythm: Normal rate and regular rhythm.     Heart sounds: Normal heart sounds. No murmur.  Pulmonary:     Effort: Pulmonary effort is normal.     Breath sounds: Normal breath sounds.  Neurological:     Mental Status: He is alert.      Diagnostic Tests:  CTA of the chest report from 05/11/2019 was reviewed.  I could not access the images on  PACS.  I was able to access images for CT scan of the chest with contrast dated 08/18/2008.  The ascending aorta at that time had a maximum diameter of 4.1 cm by my measurement.  There was no comment about the aorta on the CT scan report.  Impression:  This 49 year old gentleman has a 4.3 cm fusiform ascending aortic aneurysm which has really not changed significantly compared to his previous CTA of the chest in 2010.  This is well below the surgical threshold of 5.5 cm.  There is no history of bicuspid aortic valve disease in the patient or his family and he has no murmur on exam.  I reviewed the previous images from 2010 with the patient and his wife.  I stressed the importance of  continued good blood pressure control and preventing further enlargement and acute aortic dissection.  I advised him against doing any heavy weight lifting that may require Valsalva maneuver.  He should have a follow-up CTA of the chest in 1 year to reevaluate this aneurysm and as surveillance for his recently resected testicular tumor.  Plan:  I will plan to see him back in 1 year with a CTA of the chest.  If Dr. Alen Blew feels that he needs a CT scan of the chest sooner than 1 year then I do not think he will require a CT scan at 1 year and it can be spaced out further in the future.  I spent 30 minutes performing this consultation and > 50% of this time was spent face to face counseling and coordinating the care of this patient's ascending aortic aneurysm.   Gaye Pollack, MD [facility] [phone]

## 2019-06-27 ENCOUNTER — Other Ambulatory Visit: Payer: Self-pay

## 2019-06-27 ENCOUNTER — Inpatient Hospital Stay: Payer: BC Managed Care – PPO | Attending: Oncology | Admitting: Oncology

## 2019-06-27 VITALS — BP 120/83 | HR 68 | Temp 97.9°F | Resp 20 | Ht 72.0 in | Wt 199.7 lb

## 2019-06-27 DIAGNOSIS — Z923 Personal history of irradiation: Secondary | ICD-10-CM | POA: Diagnosis not present

## 2019-06-27 DIAGNOSIS — C6292 Malignant neoplasm of left testis, unspecified whether descended or undescended: Secondary | ICD-10-CM

## 2019-06-27 DIAGNOSIS — I712 Thoracic aortic aneurysm, without rupture: Secondary | ICD-10-CM | POA: Insufficient documentation

## 2019-06-27 DIAGNOSIS — Z9079 Acquired absence of other genital organ(s): Secondary | ICD-10-CM | POA: Diagnosis not present

## 2019-06-27 DIAGNOSIS — Z8547 Personal history of malignant neoplasm of testis: Secondary | ICD-10-CM | POA: Diagnosis not present

## 2019-06-27 DIAGNOSIS — Z79899 Other long term (current) drug therapy: Secondary | ICD-10-CM | POA: Insufficient documentation

## 2019-06-27 NOTE — Progress Notes (Signed)
Hematology and Oncology Follow Up Visit  Kevin Hess [id number] 1970/06/01 49 y.o. 06/27/2019 10:02 AM    CC: Kevin Hess Kevin Hess, M.D. Kevin Hess, M.D. Kevin Lesch, DO    Principle Diagnosis: 49 year old man with"  1.  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   2.  T2N0 of the left testicle diagnosed in May 2021.  He was found to have 2.5 cm seminoma without any clinical lymphadenopathy.   Prior Therapy:  1. Status post right radical orchiectomy with the pathology showed a 2.2 cm seminoma in August 2009.   2. He is status post adjuvant radiation therapy to the pelvis with a total of 26 Gy in 16 fractions concluded in October 2009. 3. He is status post radical orchiectomy on the left completed on Jun 10, 2019.  The final pathology showed a 2.5 cm pure seminoma that is organ confined.  Current therapy:  observation and surveillance.  Interim History: [name] returns today for a follow-up visit.  Since the last visit, he underwent left orchiectomy after he has presented with a left testicular mass.  He subsequently underwent an orchiectomy completed by Dr. Lovena Neighbours without any major complications.  He has recovered at this time without any sequelae or issues.  He denies any nausea vomiting or abdominal pain.  He did report some fatigue and tiredness and currently on testosterone replacement.     Medications reviewed without changes. Current Outpatient Medications  Medication Sig Dispense Refill  . Cholecalciferol (VITAMIN D3) 5000 UNITS CAPS Take 5,000 Units by mouth daily. Takes Monday thru friday    . EC-RX TESTOSTERONE 0.2 % CREA Apply 200 mg/mL topically daily.    . fish oil-omega-3 fatty acids 1000 MG capsule Take 1 g by mouth 2 (two) times daily.    . fluticasone (FLONASE) 50 MCG/ACT nasal spray Place 2 sprays into both nostrils daily.    . rosuvastatin (CRESTOR) 10 MG tablet Take 10 mg by mouth daily.     No current facility-administered medications for  this visit.    Allergies:  Allergies  Allergen Reactions  . Penicillins     Rash age 49      Physical Exam:  Blood pressure 120/83, pulse 68, temperature 97.9 F (36.6 C), temperature source Temporal, resp. rate 20, height 6' (1.829 m), weight 199 lb 11.2 oz (90.6 kg), SpO2 98 %.    ECOG: 0   General appearance: Comfortable appearing without any discomfort Head: Normocephalic without any trauma Oropharynx: Mucous membranes are moist and pink without any thrush or ulcers. Eyes: Pupils are equal and round reactive to light. Lymph nodes: No cervical, supraclavicular, inguinal or axillary lymphadenopathy.   Heart:regular rate and rhythm.  S1 and S2 without leg edema. Lung: Clear without any rhonchi or wheezes.  No dullness to percussion. Abdomin: Soft, nontender, nondistended with good bowel sounds.  No hepatosplenomegaly. Musculoskeletal: No joint deformity or effusion.  Full range of motion noted. Neurological: No deficits noted on motor, sensory and deep tendon reflex exam. Skin: No petechial rash or dryness.  Appeared moist.       Lab Results: Lab Results  Component Value Date   WBC 4.7 10/07/2018   HGB 15.0 10/07/2018   HCT 44.3 10/07/2018   MCV 91.2 10/07/2018   PLT 164 10/07/2018     Chemistry      Component Value Date/Time   NA 137 10/07/2018 1252   NA 140 10/09/2016 1428   K 4.2 10/07/2018 1252   K 4.3 10/09/2016 1428  CL 105 10/07/2018 1252   CL 104 03/02/2012 1043   CO2 27 10/07/2018 1252   CO2 27 10/09/2016 1428   BUN 13 10/07/2018 1252   BUN 10.3 10/09/2016 1428   CREATININE 0.87 10/07/2018 1252   CREATININE 0.8 10/09/2016 1428      Component Value Date/Time   CALCIUM 9.3 10/07/2018 1252   CALCIUM 10.2 10/09/2016 1428   ALKPHOS 43 10/07/2018 1252   ALKPHOS 55 10/09/2016 1428   AST 16 10/07/2018 1252   AST 19 10/09/2016 1428   ALT 14 10/07/2018 1252   ALT 17 10/09/2016 1428   BILITOT 0.3 10/07/2018 1252   BILITOT 0.42 10/09/2016 1428        CT scan obtained on May 30, 2019 showed no specific findings to suggest metastatic disease.  Left adrenal gland nodule likely represent adenoma.  Descending thoracic aorta measures 4.3 cm.      Imession and Plan:   49 year old man with:  1.  Left testicular seminoma diagnosed in May 2021.  He was found to have T2N0 disease.  This is in the setting of a contralateral testicular cancer diagnosed in 2009.    CT scan obtained on May 30, 2019 was personally reviewed and discussed patient.  Treatment options were reiterated at this time given his history of previous testicular cancer and radiation therapy.  Treatment options at this time were discussed and the standard of care remains and active surveillance at this time.  Given his exposure to radiation I would not recommend any further radiation therapy.  The role of adjuvant carboplatin for 1-2 cycles were reviewed but at this time I would prefer to defer that option.  At this time he is agreeable to proceed with active surveillance and will repeat imaging studies at 3 months, 6 months and at 12 months.  After that he will have a CT scan every 6 to 12 months to complete 5 years of surveillance.  2.  Right testicular seminoma: Diagnosed in 2009 without any evidence of recurrent disease.   3.  Ascending aortic aneurysm: He was followed by Dr. Mohammed Kindle and will repeat CT scan of the chest as a part of his prostate cancer follow-up.  4.  Follow-up: In 3 months for repeat evaluation and repeat imaging studies.   30  minutes were dedicated to this visit. The time was spent on reviewing laboratory data, imaging studies, discussing treatment options, and answering questions regarding future plan.      Zola Button, MD 6/14/202110:02 AM

## 2019-06-28 ENCOUNTER — Telehealth: Payer: Self-pay | Admitting: Oncology

## 2019-06-28 NOTE — Telephone Encounter (Signed)
Scheduled appt per 6/14 los.  Spoke with pt and they are aware of their appt date and time,

## 2019-07-08 DIAGNOSIS — C6212 Malignant neoplasm of descended left testis: Secondary | ICD-10-CM | POA: Diagnosis not present

## 2019-07-08 DIAGNOSIS — C6290 Malignant neoplasm of unspecified testis, unspecified whether descended or undescended: Secondary | ICD-10-CM | POA: Diagnosis not present

## 2019-07-15 DIAGNOSIS — C6290 Malignant neoplasm of unspecified testis, unspecified whether descended or undescended: Secondary | ICD-10-CM | POA: Diagnosis not present

## 2019-08-30 ENCOUNTER — Inpatient Hospital Stay: Payer: BC Managed Care – PPO | Attending: Oncology

## 2019-08-30 ENCOUNTER — Other Ambulatory Visit: Payer: Self-pay

## 2019-08-30 DIAGNOSIS — Z79899 Other long term (current) drug therapy: Secondary | ICD-10-CM | POA: Diagnosis not present

## 2019-08-30 DIAGNOSIS — C6292 Malignant neoplasm of left testis, unspecified whether descended or undescended: Secondary | ICD-10-CM

## 2019-08-30 DIAGNOSIS — I712 Thoracic aortic aneurysm, without rupture: Secondary | ICD-10-CM | POA: Diagnosis not present

## 2019-08-30 DIAGNOSIS — Z8547 Personal history of malignant neoplasm of testis: Secondary | ICD-10-CM | POA: Diagnosis not present

## 2019-08-30 DIAGNOSIS — Z923 Personal history of irradiation: Secondary | ICD-10-CM | POA: Insufficient documentation

## 2019-08-30 DIAGNOSIS — D751 Secondary polycythemia: Secondary | ICD-10-CM | POA: Insufficient documentation

## 2019-08-30 LAB — CBC WITH DIFFERENTIAL (CANCER CENTER ONLY)
Abs Immature Granulocytes: 0.01 10*3/uL (ref 0.00–0.07)
Basophils Absolute: 0 10*3/uL (ref 0.0–0.1)
Basophils Relative: 1 %
Eosinophils Absolute: 0 10*3/uL (ref 0.0–0.5)
Eosinophils Relative: 1 %
HCT: 50.1 % (ref 39.0–52.0)
Hemoglobin: 16.6 g/dL (ref 13.0–17.0)
Immature Granulocytes: 0 %
Lymphocytes Relative: 29 %
Lymphs Abs: 1.2 10*3/uL (ref 0.7–4.0)
MCH: 30 pg (ref 26.0–34.0)
MCHC: 33.1 g/dL (ref 30.0–36.0)
MCV: 90.4 fL (ref 80.0–100.0)
Monocytes Absolute: 0.4 10*3/uL (ref 0.1–1.0)
Monocytes Relative: 11 %
Neutro Abs: 2.4 10*3/uL (ref 1.7–7.7)
Neutrophils Relative %: 58 %
Platelet Count: 166 10*3/uL (ref 150–400)
RBC: 5.54 MIL/uL (ref 4.22–5.81)
RDW: 12.5 % (ref 11.5–15.5)
WBC Count: 4 10*3/uL (ref 4.0–10.5)
nRBC: 0 % (ref 0.0–0.2)

## 2019-08-30 LAB — CMP (CANCER CENTER ONLY)
ALT: 20 U/L (ref 0–44)
AST: 17 U/L (ref 15–41)
Albumin: 4.3 g/dL (ref 3.5–5.0)
Alkaline Phosphatase: 43 U/L (ref 38–126)
Anion gap: 8 (ref 5–15)
BUN: 9 mg/dL (ref 6–20)
CO2: 26 mmol/L (ref 22–32)
Calcium: 9.9 mg/dL (ref 8.9–10.3)
Chloride: 105 mmol/L (ref 98–111)
Creatinine: 0.95 mg/dL (ref 0.61–1.24)
GFR, Est AFR Am: >60 mL/min (ref >60)
GFR, Estimated: >60 mL/min (ref >60)
Glucose, Bld: 106 mg/dL — ABNORMAL HIGH (ref 70–99)
Potassium: 4.6 mmol/L (ref 3.5–5.1)
Sodium: 139 mmol/L (ref 135–145)
Total Bilirubin: 0.6 mg/dL (ref 0.3–1.2)
Total Protein: 7.1 g/dL (ref 6.5–8.1)

## 2019-08-30 LAB — LACTATE DEHYDROGENASE: LDH: 138 U/L (ref 98–192)

## 2019-08-31 LAB — AFP TUMOR MARKER: AFP, Serum, Tumor Marker: 3.9 ng/mL (ref 0.0–8.3)

## 2019-08-31 LAB — BETA HCG QUANT (REF LAB): hCG Quant: <1 m[IU]/mL (ref 0–3)

## 2019-09-02 ENCOUNTER — Other Ambulatory Visit: Payer: Self-pay

## 2019-09-02 ENCOUNTER — Ambulatory Visit (HOSPITAL_COMMUNITY)
Admission: RE | Admit: 2019-09-02 | Discharge: 2019-09-02 | Disposition: A | Discharge status: Home / Self Care | Payer: BC Managed Care – PPO | Source: Ambulatory Visit | Attending: Oncology | Admitting: Oncology

## 2019-09-02 DIAGNOSIS — C6292 Malignant neoplasm of left testis, unspecified whether descended or undescended: Secondary | ICD-10-CM | POA: Diagnosis not present

## 2019-09-02 DIAGNOSIS — C6290 Malignant neoplasm of unspecified testis, unspecified whether descended or undescended: Secondary | ICD-10-CM | POA: Diagnosis not present

## 2019-09-02 MED ORDER — SODIUM CHLORIDE (PF) 0.9 % IJ SOLN
INTRAMUSCULAR | Status: AC
  Filled 2019-09-02: qty 50

## 2019-09-02 MED ORDER — IOHEXOL 300 MG/ML  SOLN
100.0000 mL | Freq: Once | INTRAMUSCULAR | Status: AC | PRN
  Administered 2019-09-02: 100 mL via INTRAVENOUS

## 2019-09-06 ENCOUNTER — Other Ambulatory Visit: Payer: Self-pay

## 2019-09-06 ENCOUNTER — Inpatient Hospital Stay (HOSPITAL_BASED_OUTPATIENT_CLINIC_OR_DEPARTMENT_OTHER): Payer: BC Managed Care – PPO | Admitting: Oncology

## 2019-09-06 VITALS — BP 129/81 | HR 76 | Temp 96.7°F | Resp 18 | Ht 72.0 in | Wt 205.9 lb

## 2019-09-06 DIAGNOSIS — D751 Secondary polycythemia: Secondary | ICD-10-CM | POA: Diagnosis not present

## 2019-09-06 DIAGNOSIS — Z8547 Personal history of malignant neoplasm of testis: Secondary | ICD-10-CM | POA: Diagnosis not present

## 2019-09-06 DIAGNOSIS — C6292 Malignant neoplasm of left testis, unspecified whether descended or undescended: Secondary | ICD-10-CM

## 2019-09-06 DIAGNOSIS — I712 Thoracic aortic aneurysm, without rupture: Secondary | ICD-10-CM | POA: Diagnosis not present

## 2019-09-06 DIAGNOSIS — Z923 Personal history of irradiation: Secondary | ICD-10-CM | POA: Diagnosis not present

## 2019-09-06 DIAGNOSIS — Z79899 Other long term (current) drug therapy: Secondary | ICD-10-CM | POA: Diagnosis not present

## 2019-09-06 NOTE — Progress Notes (Signed)
Hematology and Oncology Follow Up Visit  Kevin Hess [id number] 08/30/1970 49 y.o. 09/06/2019 3:24 PM    CC: Kevin Hess, M.D. Kevin Hess, M.D. Kevin Lesch, DO    Principle Diagnosis: 49 year old man with"  1.  Right testicular cancer diagnosed in August 2009.  He was found to have T2N0 pure seminoma. 2.  Left testicular cancer diagnosed in May 2021.  He was found to have T2N0, 2.5 cm seminoma.    Prior Therapy:  1. Status post right radical orchiectomy with the pathology showed a 2.2 cm seminoma in August 2009.   2. He is status post adjuvant radiation therapy to the pelvis with a total of 26 Gy in 16 fractions concluded in October 2009. 3. He is status post radical orchiectomy on the left completed on Jun 10, 2019.  The final pathology showed a 2.5 cm pure seminoma that is organ confined.  Current therapy: Active surveillance  Interim History: Kevin Hess is here for a follow-up evaluation.  Since the last visit, he reports no major changes in his health. He denies any nausea, vomiting or abdominal pain. He denies any excessive fatigue or tiredness. He has been using testosterone replacement therapy without any new complications. He does report slight increase in his weight with reasonable appetite.     Medications updated on review. Current Outpatient Medications  Medication Sig Dispense Refill  . Cholecalciferol (VITAMIN D3) 5000 UNITS CAPS Take 5,000 Units by mouth daily. Takes Monday thru friday    . EC-RX TESTOSTERONE 0.2 % CREA Apply 200 mg/mL topically daily.    . fish oil-omega-3 fatty acids 1000 MG capsule Take 1 g by mouth 2 (two) times daily.    . fluticasone (FLONASE) 50 MCG/ACT nasal spray Place 2 sprays into both nostrils daily.    . rosuvastatin (CRESTOR) 10 MG tablet Take 10 mg by mouth daily.     No current facility-administered medications for this visit.    Allergies:  Allergies  Allergen Reactions  . Penicillins     Rash age 68       Physical Exam:   Blood pressure 129/81, pulse 76, temperature (!) 96.7 F (35.9 C), temperature source Tympanic, resp. rate 18, height 6' (1.829 m), weight 205 lb 14.4 oz (93.4 kg), SpO2 98 %.    ECOG: 0    General appearance: Alert, awake without any distress. Head: Atraumatic without abnormalities Oropharynx: Without any thrush or ulcers. Eyes: No scleral icterus. Lymph nodes: No lymphadenopathy noted in the cervical, supraclavicular, or axillary nodes Heart:regular rate and rhythm, without any murmurs or gallops.   Lung: Clear to auscultation without any rhonchi, wheezes or dullness to percussion. Abdomin: Soft, nontender without any shifting dullness or ascites. Musculoskeletal: No clubbing or cyanosis. Neurological: No motor or sensory deficits. Skin: No rashes or lesions.       Lab Results: Lab Results  Component Value Date   WBC 4.0 08/30/2019   HGB 16.6 08/30/2019   HCT 50.1 08/30/2019   MCV 90.4 08/30/2019   PLT 166 08/30/2019     Chemistry      Component Value Date/Time   NA 139 08/30/2019 0900   NA 140 10/09/2016 1428   K 4.6 08/30/2019 0900   K 4.3 10/09/2016 1428   CL 105 08/30/2019 0900   CL 104 03/02/2012 1043   CO2 26 08/30/2019 0900   CO2 27 10/09/2016 1428   BUN 9 08/30/2019 0900   BUN 10.3 10/09/2016 1428   CREATININE 0.95 08/30/2019 0900  CREATININE 0.8 10/09/2016 1428      Component Value Date/Time   CALCIUM 9.9 08/30/2019 0900   CALCIUM 10.2 10/09/2016 1428   ALKPHOS 43 08/30/2019 0900   ALKPHOS 55 10/09/2016 1428   AST 17 08/30/2019 0900   AST 19 10/09/2016 1428   ALT 20 08/30/2019 0900   ALT 17 10/09/2016 1428   BILITOT 0.6 08/30/2019 0900   BILITOT 0.42 10/09/2016 1428       CT scan obtained on May 30, 2019 showed no specific findings to suggest metastatic disease.  Left adrenal gland nodule likely represent adenoma.  Descending thoracic aorta measures 4.3 cm.   IMPRESSION: 1. Stable exam.  No significant  change compared with 05/23/2019. 2. Stable left adrenal nodule measuring 9 mm. 3. Unchanged appearance of subcentimeter, too small to characterize low-density structures within the liver. 4.  Aortic Atherosclerosis (ICD10-I70.0). 5. Stable ascending thoracic aortic aneurysm. Recommend annual imaging followup by CTA or MRA. This recommendation follows 2010 ACCF/AHA/AATS/ACR/ASA/SCA/SCAI/SIR/STS/SVM Guidelines for the Diagnosis and Management of Patients with Thoracic Aortic Disease. Circulation. 2010; 121: Q683-M196. Aortic aneurysm NOS (ICD10-I71.9)   Imession and Plan:   49 year old man with:  1.  Seminoma of the left testicle presented with T2N0 diagnosed in May 2021.   He is currently on active surveillance without any evidence to suggest relapsed disease.  CT scan, laboratory data including tumor markers from August 17 were reviewed.  Imaging studies from August 24 personally reviewed and showed no evidence of metastatic disease.  The natural course of this disease and risk of relapse was assessed and the role of adjuvant therapy were reiterated.  At this time no additional intervention is needed.  The plan is to repeat imaging studies in 3 months and 6 months after the.  He will continue every 6 months imaging studies for the first 2 years and annually after that.  2.  Right testicular seminoma without any evidence of relapsed disease after surgical resection and radiation therapy.   3.  Ascending aortic aneurysm: Unchanged at this time.  He continues to follow with thoracic surgery regarding this issue.  4.  Adrenal nodule: Continues to be stable and likely an adenoma.  5. 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 I recommended the blood donation at this time.  6.  Follow-up: In 3 months for repeat laboratory testing and imaging studies.   30  minutes were spent on this encounter.  Time was dedicated to updating his disease status, discussing treatment options and  reviewing his future plan of care.      Kevin Button, MD 8/24/20213:24 PM

## 2019-09-07 DIAGNOSIS — M25532 Pain in left wrist: Secondary | ICD-10-CM | POA: Diagnosis not present

## 2019-09-07 DIAGNOSIS — M654 Radial styloid tenosynovitis [de Quervain]: Secondary | ICD-10-CM | POA: Diagnosis not present

## 2019-09-13 ENCOUNTER — Telehealth: Payer: Self-pay | Admitting: *Deleted

## 2019-09-13 DIAGNOSIS — N200 Calculus of kidney: Secondary | ICD-10-CM | POA: Diagnosis not present

## 2019-09-13 NOTE — Telephone Encounter (Addendum)
Pt called requesting labs and imaging be done at [facility] in [place]. Called med center, set up appointment for labs. Faxed lab orders and diagnosis to [facility] @ [phone]. Advised pt to call 2 weeks prior to scan, to inform us to start prior authorization. Wants scan Nov 30 or after. Pt verbalized understanding

## 2019-11-16 DIAGNOSIS — E291 Testicular hypofunction: Secondary | ICD-10-CM | POA: Diagnosis not present

## 2019-12-05 ENCOUNTER — Ambulatory Visit (INDEPENDENT_AMBULATORY_CARE_PROVIDER_SITE_OTHER): Payer: BC Managed Care – PPO

## 2019-12-05 ENCOUNTER — Other Ambulatory Visit: Payer: Self-pay

## 2019-12-05 DIAGNOSIS — I712 Thoracic aortic aneurysm, without rupture: Secondary | ICD-10-CM

## 2019-12-05 DIAGNOSIS — K5790 Diverticulosis of intestine, part unspecified, without perforation or abscess without bleeding: Secondary | ICD-10-CM | POA: Diagnosis not present

## 2019-12-05 DIAGNOSIS — J984 Other disorders of lung: Secondary | ICD-10-CM | POA: Diagnosis not present

## 2019-12-05 DIAGNOSIS — C6292 Malignant neoplasm of left testis, unspecified whether descended or undescended: Secondary | ICD-10-CM

## 2019-12-05 DIAGNOSIS — K7689 Other specified diseases of liver: Secondary | ICD-10-CM | POA: Diagnosis not present

## 2019-12-05 DIAGNOSIS — C6290 Malignant neoplasm of unspecified testis, unspecified whether descended or undescended: Secondary | ICD-10-CM | POA: Diagnosis not present

## 2019-12-05 MED ORDER — IOHEXOL 300 MG/ML  SOLN
100.0000 mL | Freq: Once | INTRAMUSCULAR | Status: AC | PRN
  Administered 2019-12-05: 100 mL via INTRAVENOUS

## 2019-12-12 DIAGNOSIS — D485 Neoplasm of uncertain behavior of skin: Secondary | ICD-10-CM | POA: Diagnosis not present

## 2019-12-12 DIAGNOSIS — L821 Other seborrheic keratosis: Secondary | ICD-10-CM | POA: Diagnosis not present

## 2019-12-12 DIAGNOSIS — D225 Melanocytic nevi of trunk: Secondary | ICD-10-CM | POA: Diagnosis not present

## 2019-12-12 DIAGNOSIS — D3610 Benign neoplasm of peripheral nerves and autonomic nervous system, unspecified: Secondary | ICD-10-CM | POA: Diagnosis not present

## 2019-12-13 ENCOUNTER — Inpatient Hospital Stay: Payer: BC Managed Care – PPO

## 2019-12-21 ENCOUNTER — Ambulatory Visit (AMBULATORY_SURGERY_CENTER): Payer: Self-pay | Admitting: *Deleted

## 2019-12-21 ENCOUNTER — Other Ambulatory Visit: Payer: Self-pay

## 2019-12-21 VITALS — Ht 72.0 in | Wt 195.0 lb

## 2019-12-21 DIAGNOSIS — Z1211 Encounter for screening for malignant neoplasm of colon: Secondary | ICD-10-CM

## 2019-12-21 MED ORDER — PLENVU 140 G PO SOLR: 1.0000 | ORAL | 0 refills | Status: DC

## 2019-12-21 NOTE — Progress Notes (Signed)
Fully vax'd  No egg or soy allergy known to patient  No issues with past sedation with any surgeries or procedures no intubation problems in the past  No FH of Malignant Hyperthermia No diet pills per patient No home 02 use per patient  No blood thinners per patient  Pt denies issues with constipation  No A fib or A flutter  EMMI video to pt or via [place] 19 guidelines implemented in PV today with Pt and RN   Plenvu  Coupon given to pt in PV today , Code to Pharmacy   Due to the COVID-19 pandemic we are asking patients to follow these guidelines. Please only bring one care partner. Please be aware that your care partner may wait in the car in the parking lot or if they feel like they will be too hot to wait in the car, they may wait in the lobby on the 4th floor. All care partners are required to wear a mask the entire time (we do not have any that we can provide them), they need to practice social distancing, and we will do a Covid check for all patient's and care partners when you arrive. Also we will check their temperature and your temperature. If the care partner waits in their car they need to stay in the parking lot the entire time and we will call them on their cell phone when the patient is ready for discharge so they can bring the car to the front of the building. Also all patient's will need to wear a mask into building.

## 2019-12-27 ENCOUNTER — Encounter: Payer: Self-pay | Admitting: Oncology

## 2019-12-27 ENCOUNTER — Encounter: Payer: Self-pay | Admitting: Gastroenterology

## 2019-12-29 DIAGNOSIS — H1013 Acute atopic conjunctivitis, bilateral: Secondary | ICD-10-CM | POA: Diagnosis not present

## 2019-12-29 DIAGNOSIS — Z125 Encounter for screening for malignant neoplasm of prostate: Secondary | ICD-10-CM | POA: Diagnosis not present

## 2019-12-30 DIAGNOSIS — C6292 Malignant neoplasm of left testis, unspecified whether descended or undescended: Secondary | ICD-10-CM | POA: Diagnosis not present

## 2020-01-03 ENCOUNTER — Ambulatory Visit (AMBULATORY_SURGERY_CENTER): Payer: BC Managed Care – PPO | Admitting: Gastroenterology

## 2020-01-03 ENCOUNTER — Encounter: Payer: Self-pay | Admitting: Gastroenterology

## 2020-01-03 ENCOUNTER — Other Ambulatory Visit: Payer: Self-pay

## 2020-01-03 VITALS — BP 102/66 | HR 68 | Temp 98.6°F | Resp 17 | Ht 72.0 in | Wt 195.0 lb

## 2020-01-03 DIAGNOSIS — D123 Benign neoplasm of transverse colon: Secondary | ICD-10-CM

## 2020-01-03 DIAGNOSIS — Z1211 Encounter for screening for malignant neoplasm of colon: Secondary | ICD-10-CM

## 2020-01-03 DIAGNOSIS — D124 Benign neoplasm of descending colon: Secondary | ICD-10-CM | POA: Diagnosis not present

## 2020-01-03 MED ORDER — SODIUM CHLORIDE 0.9 % IV SOLN: 500.0000 mL | Freq: Once | INTRAVENOUS | Status: DC

## 2020-01-03 NOTE — Progress Notes (Signed)
Called to room to assist during endoscopic procedure.  Patient ID and intended procedure confirmed with present staff. Received instructions for my participation in the procedure from the performing physician.  

## 2020-01-03 NOTE — Op Note (Signed)
[place] Patient Name: Kevin Hess Procedure Date: 01/03/2020 4:06 PM MRN: [id number] Endoscopist: Ladene Artist , MD Age: 49 Referring MD:  Date of Birth: 1970/06/25 Gender: Male Account #: [id number] Procedure:                Colonoscopy Indications:              Screening for colorectal malignant neoplasm Medicines:                Monitored Anesthesia Care Procedure:                Pre-Anesthesia Assessment:                           - Prior to the procedure, a History and Physical                            was performed, and patient medications and                            allergies were reviewed. The patient's tolerance of                            previous anesthesia was also reviewed. The risks                            and benefits of the procedure and the sedation                            options and risks were discussed with the patient.                            All questions were answered, and informed consent                            was obtained. Prior Anticoagulants: The patient has                            taken no previous anticoagulant or antiplatelet                            agents. ASA Grade Assessment: II - A patient with                            mild systemic disease. After reviewing the risks                            and benefits, the patient was deemed in                            satisfactory condition to undergo the procedure.                           After obtaining informed consent, the colonoscope  was passed under direct vision. Throughout the                            procedure, the patient's blood pressure, pulse, and                            oxygen saturations were monitored continuously. The                            Olympus CF-[id number]) Colonoscope was                            introduced through the anus and advanced to the the                            cecum, identified by  appendiceal orifice and                            ileocecal valve. The ileocecal valve, appendiceal                            orifice, and rectum were photographed. The quality                            of the bowel preparation was excellent. The                            colonoscopy was performed without difficulty. The                            patient tolerated the procedure well. Scope In: 4:20:00 PM Scope Out: 4:35:22 PM Scope Withdrawal Time: 0 hours 13 minutes 28 seconds  Total Procedure Duration: 0 hours 15 minutes 22 seconds  Findings:                 The perianal and digital rectal examinations were                            normal.                           Two sessile polyps were found in the descending                            colon and transverse colon. The polyps were 6 mm in                            size. These polyps were removed with a cold snare.                            Resection and retrieval were complete.                           A few medium-mouthed diverticula were found in the  left colon.                           Internal hemorrhoids were found during                            retroflexion. The hemorrhoids were small and Grade                            I (internal hemorrhoids that do not prolapse).                           The exam was otherwise without abnormality on                            direct and retroflexion views. Complications:            No immediate complications. Estimated blood loss:                            None. Estimated Blood Loss:     Estimated blood loss: none. Impression:               - Two 6 mm polyps in the descending colon and in                            the transverse colon, removed with a cold snare.                            Resected and retrieved.                           - Mild diverticulosis in the left colon.                           - Internal hemorrhoids.                            - The examination was otherwise normal on direct                            and retroflexion views. Recommendation:           - Repeat colonoscopy after studies are complete for                            surveillance based on pathology results.                           - Patient has a contact number available for                            emergencies. The signs and symptoms of potential                            delayed complications were discussed with the  patient. Return to normal activities tomorrow.                            Written discharge instructions were provided to the                            patient.                           - High fiber diet.                           - Continue present medications.                           - Await pathology results. Ladene Artist, MD 01/03/2020 4:38:40 PM This report has been signed electronically.

## 2020-01-03 NOTE — Patient Instructions (Signed)
Please, read all of the handouts given to you by your recovery room nurse.  YOU HAD AN ENDOSCOPIC PROCEDURE TODAY AT THE [facility]:   Refer to the procedure report that was given to you for any specific questions about what was found during the examination.  If the procedure report does not answer your questions, please call your gastroenterologist to clarify.  If you requested that your care partner not be given the details of your procedure findings, then the procedure report has been included in a sealed envelope for you to review at your convenience later.  YOU SHOULD EXPECT: Some feelings of bloating in the abdomen. Passage of more gas than usual.  Walking can help get rid of the air that was put into your GI tract during the procedure and reduce the bloating. If you had a lower endoscopy (such as a colonoscopy or flexible sigmoidoscopy) you may notice spotting of blood in your stool or on the toilet paper. If you underwent a bowel prep for your procedure, you may not have a normal bowel movement for a few days.  Please Note:  You might notice some irritation and congestion in your nose or some drainage.  This is from the oxygen used during your procedure.  There is no need for concern and it should clear up in a day or so.  SYMPTOMS TO REPORT IMMEDIATELY:  Following lower endoscopy (colonoscopy or flexible sigmoidoscopy):  Excessive amounts of blood in the stool  Significant tenderness or worsening of abdominal pains  Swelling of the abdomen that is new, acute  Fever of 100F or higher   For urgent or emergent issues, a gastroenterologist can be reached at any hour by calling [phone]. Do not use MyChart messaging for urgent concerns.    DIET:  We do recommend a small meal at first, but then you may proceed to your regular diet.  Drink plenty of fluids but you should avoid alcoholic beverages for 24 hours.  Try to increase the fiber in your diet, and drink plenty of  water.  ACTIVITY:  You should plan to take it easy for the rest of today and you should NOT DRIVE or use heavy machinery until tomorrow (because of the sedation medicines used during the test).    FOLLOW UP: Our staff will call the number listed on your records 48-72 hours following your procedure to check on you and address any questions or concerns that you may have regarding the information given to you following your procedure. If we do not reach you, we will leave a message.  We will attempt to reach you two times.  During this call, we will ask if you have developed any symptoms of COVID 19. If you develop any symptoms (ie: fever, flu-like symptoms, shortness of breath, cough etc.) before then, please call [phone].  If you test positive for Covid 19 in the 2 weeks post procedure, please call and report this information to us.    If any biopsies were taken you will be contacted by phone or by letter within the next 1-3 weeks.  Please call us at [phone] if you have not heard about the biopsies in 3 weeks.    SIGNATURES/CONFIDENTIALITY: You and/or your care partner have signed paperwork which will be entered into your electronic medical record.  These signatures attest to the fact that that the information above on your After Visit Summary has been reviewed and is understood.  Full responsibility of the confidentiality   of this discharge information lies with you and/or your care-partner.

## 2020-01-03 NOTE — Progress Notes (Signed)
A/ox3, pleased with MAC, report to RN 

## 2020-01-03 NOTE — Progress Notes (Signed)
Alexsis Branscom CRNA relieves [employer]

## 2020-01-03 NOTE — Progress Notes (Signed)
Pt's states no medical or surgical changes since previsit or office visit.  Vs in ad - CW

## 2020-01-05 ENCOUNTER — Telehealth: Payer: Self-pay

## 2020-01-05 NOTE — Telephone Encounter (Signed)
  Follow up Call-  Call back number 01/03/2020  Post procedure Call Back phone  # [phone]  Permission to leave phone message Yes  Some recent data might be hidden     Patient questions:  Do you have a fever, pain , or abdominal swelling? No. Pain Score  0 *  Have you tolerated food without any problems? Yes.    Have you been able to return to your normal activities? Yes.    Do you have any questions about your discharge instructions: Diet   No. Medications  No. Follow up visit  No.  Do you have questions or concerns about your Care? No.  Actions: * If pain score is 4 or above: No action needed, pain <4.  1. Have you developed a fever since your procedure? no  2.   Have you had an respiratory symptoms (SOB or cough) since your procedure? no  3.   Have you tested positive for COVID 19 since your procedure no  4.   Have you had any family members/close contacts diagnosed with the COVID 19 since your procedure?  no   If yes to any of these questions please route to Joylene John, RN and Joella Prince, RN

## 2020-01-12 DIAGNOSIS — Z0000 Encounter for general adult medical examination without abnormal findings: Secondary | ICD-10-CM | POA: Diagnosis not present

## 2020-01-12 DIAGNOSIS — Z23 Encounter for immunization: Secondary | ICD-10-CM | POA: Diagnosis not present

## 2020-01-12 DIAGNOSIS — E291 Testicular hypofunction: Secondary | ICD-10-CM | POA: Diagnosis not present

## 2020-01-12 DIAGNOSIS — C6211 Malignant neoplasm of descended right testis: Secondary | ICD-10-CM | POA: Diagnosis not present

## 2020-01-12 DIAGNOSIS — N200 Calculus of kidney: Secondary | ICD-10-CM | POA: Diagnosis not present

## 2020-01-12 DIAGNOSIS — E7800 Pure hypercholesterolemia, unspecified: Secondary | ICD-10-CM | POA: Diagnosis not present

## 2020-01-18 ENCOUNTER — Encounter: Payer: Self-pay | Admitting: Gastroenterology

## 2020-03-26 ENCOUNTER — Encounter: Payer: Self-pay | Admitting: Oncology

## 2020-03-27 ENCOUNTER — Other Ambulatory Visit: Payer: Self-pay | Admitting: Oncology

## 2020-03-27 DIAGNOSIS — C6292 Malignant neoplasm of left testis, unspecified whether descended or undescended: Secondary | ICD-10-CM

## 2020-03-27 NOTE — Telephone Encounter (Signed)
Lab orders faxed to [facility] per patient request.  He has CT scheduled for next week in [place]

## 2020-03-30 DIAGNOSIS — C6290 Malignant neoplasm of unspecified testis, unspecified whether descended or undescended: Secondary | ICD-10-CM | POA: Diagnosis not present

## 2020-04-03 ENCOUNTER — Other Ambulatory Visit: Payer: Self-pay

## 2020-04-03 ENCOUNTER — Other Ambulatory Visit: Payer: BC Managed Care – PPO

## 2020-04-03 ENCOUNTER — Ambulatory Visit (INDEPENDENT_AMBULATORY_CARE_PROVIDER_SITE_OTHER): Payer: BC Managed Care – PPO

## 2020-04-03 DIAGNOSIS — J984 Other disorders of lung: Secondary | ICD-10-CM | POA: Diagnosis not present

## 2020-04-03 DIAGNOSIS — M4184 Other forms of scoliosis, thoracic region: Secondary | ICD-10-CM | POA: Diagnosis not present

## 2020-04-03 DIAGNOSIS — C6290 Malignant neoplasm of unspecified testis, unspecified whether descended or undescended: Secondary | ICD-10-CM | POA: Diagnosis not present

## 2020-04-03 DIAGNOSIS — C6292 Malignant neoplasm of left testis, unspecified whether descended or undescended: Secondary | ICD-10-CM

## 2020-04-03 DIAGNOSIS — I712 Thoracic aortic aneurysm, without rupture: Secondary | ICD-10-CM | POA: Diagnosis not present

## 2020-04-03 DIAGNOSIS — M4186 Other forms of scoliosis, lumbar region: Secondary | ICD-10-CM | POA: Diagnosis not present

## 2020-04-03 MED ORDER — IOHEXOL 300 MG/ML  SOLN
100.0000 mL | Freq: Once | INTRAMUSCULAR | Status: AC | PRN
  Administered 2020-04-03: 100 mL via INTRAVENOUS

## 2020-04-04 ENCOUNTER — Telehealth: Payer: Self-pay | Admitting: Oncology

## 2020-04-04 ENCOUNTER — Telehealth: Payer: Self-pay | Admitting: *Deleted

## 2020-04-04 NOTE — Telephone Encounter (Signed)
Received phone call from [facility] to report CT results.  Dr. Alen Blew made aware, report is available in [place].

## 2020-04-04 NOTE — Telephone Encounter (Signed)
R/s 4/4 appt to 3/24 per sch msg. Called and spoke with patient. Confirmed new date and time and change to Phone visit

## 2020-04-05 ENCOUNTER — Inpatient Hospital Stay: Payer: BC Managed Care – PPO | Attending: Oncology | Admitting: Oncology

## 2020-04-05 DIAGNOSIS — C6292 Malignant neoplasm of left testis, unspecified whether descended or undescended: Secondary | ICD-10-CM | POA: Diagnosis not present

## 2020-04-05 DIAGNOSIS — C6290 Malignant neoplasm of unspecified testis, unspecified whether descended or undescended: Secondary | ICD-10-CM | POA: Insufficient documentation

## 2020-04-05 DIAGNOSIS — M545 Low back pain, unspecified: Secondary | ICD-10-CM | POA: Diagnosis not present

## 2020-04-05 DIAGNOSIS — M533 Sacrococcygeal disorders, not elsewhere classified: Secondary | ICD-10-CM | POA: Diagnosis not present

## 2020-04-05 NOTE — Progress Notes (Signed)
Hematology and Oncology Follow Up for Telemedicine Visits  Kevin Hess [id number] 1970-05-01 50 y.o. 04/05/2020 10:17 AM Kevin Hess, MDBlomgren, Kevin Salina, MD   I connected with Kevin Hess on 04/05/20 at  1:00 PM EDT by telephone visit and verified that I am speaking with the correct person using two identifiers.   I discussed the limitations, risks, security and privacy concerns of performing an evaluation and management service by telemedicine and the availability of in-person appointments. I also discussed with the patient that there may be a patient responsible charge related to this service. The patient expressed understanding and agreed to proceed.  Other persons participating in the visit and their role in the encounter: None  Patient's location: Home Provider's location: Office    Principle Diagnosis: 50 year old man with:  1.    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   2.    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    He developed relapsed disease with pelvic lymph node indicating stage IIB in March 2022.   Prior Therapy:  1. Status post right radical orchiectomy with the pathology showed a 2.2 cm seminoma in August 2009.   2. He is status post adjuvant radiation therapy to the pelvis with a total of 26 Gy in 16 fractions concluded in October 2009. 3. He is status post radical orchiectomy on the left completed on Jun 10, 2019.  The final pathology showed a 2.5 cm pure seminoma that is organ confined.  Current therapy:  Other consideration for systemic therapy for his relapsed seminoma.  Interim History: Kevin Hess  reports      Medications: I have reviewed the patient's current medications.  Current Outpatient Medications  Medication Sig Dispense Refill  . Cholecalciferol (VITAMIN D3) 5000 UNITS CAPS Take 5,000 Units by mouth daily. Takes Monday thru friday (Patient not taking: No sig reported)    . fluticasone (FLONASE) 50 MCG/ACT  nasal spray Place 2 sprays into both nostrils daily.    . rosuvastatin (CRESTOR) 10 MG tablet Take 10 mg by mouth daily.    [name] testosterone cypionate (DEPOTESTOSTERONE CYPIONATE) 200 MG/ML injection SMARTSIG:Milliliter(s) IM     No current facility-administered medications for this visit.     Allergies:  Allergies  Allergen Reactions  . Penicillins     Rash age 80        Lab Results: Lab Results  Component Value Date   WBC 4.0 08/30/2019   HGB 16.6 08/30/2019   HCT 50.1 08/30/2019   MCV 90.4 08/30/2019   PLT 166 08/30/2019     Chemistry      Component Value Date/Time   NA 139 08/30/2019 0900   NA 140 10/09/2016 1428   K 4.6 08/30/2019 0900   K 4.3 10/09/2016 1428   CL 105 08/30/2019 0900   CL 104 03/02/2012 1043   CO2 26 08/30/2019 0900   CO2 27 10/09/2016 1428   BUN 9 08/30/2019 0900   BUN 10.3 10/09/2016 1428   CREATININE 0.95 08/30/2019 0900   CREATININE 0.8 10/09/2016 1428      Component Value Date/Time   CALCIUM 9.9 08/30/2019 0900   CALCIUM 10.2 10/09/2016 1428   ALKPHOS 43 08/30/2019 0900   ALKPHOS 55 10/09/2016 1428   AST 17 08/30/2019 0900   AST 19 10/09/2016 1428   ALT 20 08/30/2019 0900   ALT 17 10/09/2016 1428   BILITOT 0.6 08/30/2019 0900   BILITOT 0.42 10/09/2016 1428       Radiological Studies: CT  CHEST ABDOMEN PELVIS W CONTRAST  Result Date: 04/04/2020 CLINICAL DATA:  History of testicular cancer, restaging. EXAM: CT CHEST, ABDOMEN, AND PELVIS WITH CONTRAST TECHNIQUE: Multidetector CT imaging of the chest, abdomen and pelvis was performed following the standard protocol during bolus administration of intravenous contrast. CONTRAST:  153mL OMNIPAQUE IOHEXOL 300 MG/ML  SOLN COMPARISON:  Multiple priors including CT chest abdomen pelvis December 05, 2019 FINDINGS: CT CHEST FINDINGS Cardiovascular: Normal size heart. No significant pericardial effusion/thickening. Unchanged size of the 4.3 cm ascending thoracic aortic aneurysm. No central  pulmonary embolus. Mediastinum/Nodes: Thyroid is grossly unremarkable without discrete nodularity. No pathologically enlarged mediastinal, hilar or axillary lymph nodes. Trachea esophagus are grossly unremarkable. Lungs/Pleura: Similar mild biapical pleuroparenchymal scarring. No focal consolidation. No suspicious pulmonary nodules or masses. No pleural effusion. No pneumothorax. Musculoskeletal: Dextroscoliosis of the thoracic spine with thoracolumbar fixation rods. No suspicious lytic or blastic lesions of bone. CT ABDOMEN PELVIS FINDINGS Hepatobiliary: Similar size and appearance of the subcentimeter hypodense hepatic lesions for instance the 7 mm lesion in the posterior right lobe of the liver on image 66/2 and the 4 mm lesion in the right hepatic dome on image 53/2, these are technically too small to accurately characterize but favored to represent benign hepatic cysts. Gallbladder is unremarkable. No biliary ductal dilation. Pancreas: Unremarkable. No pancreatic ductal dilatation or surrounding inflammatory changes. Spleen: Normal in size without focal abnormality. Adrenals/Urinary Tract: Similar mild nodular thickening of the left adrenal gland measuring up to 8 mm which is likely benign. Right adrenal glands unremarkable. No hydronephrosis. Symmetric enhancement excretion of contrast in the bilateral kidneys. No suspicious filling defect visualized within the opacified portions of the collecting system and ureters on delayed imaging. Urinary bladder is grossly unremarkable for degree of distension. Stomach/Bowel: Stomach is grossly unremarkable for degree of distension. Normal positioning of the duodenum/ligament of [place]. No suspicious small bowel wall thickening or dilation. The appendix and terminal ileum appear unremarkable. No suspicious colonic wall thickening or mass like lesions. Descending and sigmoid colonic diverticulosis without findings of acute diverticulitis. Vascular/Lymphatic: Aortic  atherosclerosis without aneurysmal dilation of the abdominal aorta. There are new pathologically enlarged left retroperitoneal lymph nodes for instance a 2.8 x 2.8 cm left periaortic lymph node on image 74/2 at the level of the renal hilum. Left periaortic lymph node measuring 2 cm just inferior to the left kidney on image 81/2. No pathologically enlarged gastrohepatic or periportal lymph nodes. No pathologically enlarged pelvic lymph nodes. Reproductive: Status post left orchiectomy. Dystrophic prostatic calcifications otherwise the prostate is grossly unremarkable. Other: No abdominopelvic ascites. Musculoskeletal: Lumbar levoscoliosis. Multilevel degenerative changes spine. No aggressive lytic or blastic lesions bone. IMPRESSION: 1. New pathologically enlarged left retroperitoneal lymph nodes, consistent with metastatic disease involvement. 2. Status post left orchiectomy. No evidence of metastatic disease in the thorax. 3. Similar mild nodular thickening of the left adrenal gland measuring up to 8 mm which is likely benign, continued attention on follow-up imaging is recommended. 4. Unchanged size of the 4.3 cm ascending thoracic aortic aneurysm. Recommend annual imaging followup by CTA or MRA. However, currently continue surveillance on oncologic imaging is sufficient in this patient. This recommendation follows 2010 ACCF/AHA/AATS/ACR/ASA/SCA/SCAI/SIR/STS/SVM Guidelines for the Diagnosis and Management of Patients with Thoracic Aortic Disease. Circulation. 2010; 121: L381-O175. Aortic aneurysm NOS (ICD10-I71.9) 5. Descending and sigmoid colonic diverticulosis without findings of acute diverticulitis. 6. Aortic atherosclerosis.  Aortic Atherosclerosis (ICD10-I70.0). These results will be called to the ordering clinician or representative by the Radiologist Assistant, and communication documented in  the PACS or [employer]. Electronically Signed   By: Dahlia Bailiff MD   On: 04/04/2020 11:52      Impression and Plan:   50 year old man with:  1.    T2N0 seminoma of the left testicle diagnosed in May 2021.  He had developed stage IIB disease with left retroperitoneal lymph node enlargement measuring 2.8 x 2.8 cm  His disease status was updated at this time and treatment options were discussed.  He has developed a stage II disease based on imaging studies completed on April 04, 2020.  Based on these findings, I recommended proceeding with systemic chemotherapy utilizing BEP.  Complication associated with this therapy include nausea, vomiting, myelosuppression, neutropenia and possible sepsis.  Bleomycin toxicity was emphasized specifically including fevers and pulmonary toxicity.  He will require 3 cycles of therapy.  Alternative options would be 4 cycles of EP chemotherapy instead.  After discussion today,  2.  Right testicular seminoma: He status post radiation therapy without any additional treatment at this time is needed.   3.    Pulmonary toxicity screening: We will obtain pulmonary function test before the start of chemotherapy.  4.    IV access: Risks and benefits of a Port-A-Cath insertion were discussed at this time.  Complications including bleeding, thrombosis  5. Polycythemia: His hemoglobin is back to normal range at this time.  6.  Follow-up:       I provided 20 minutes of non face-to-face telephone visit time during this encounter.  Time was dedicated to reviewing laboratory data, CT scan imaging studies and treatment options for the future.   Zola Button, MD 04/05/2020 10:17 AM

## 2020-04-05 NOTE — Progress Notes (Signed)
START ON PATHWAY REGIMEN - Testicular     A cycle is every 21 days:     Bleomycin      Etoposide      Cisplatin   **Always confirm dose/schedule in your pharmacy ordering system**  Patient Characteristics: Seminoma, Newly Diagnosed, Stage II (Lymph Node Mass 1 cm - 3 cm) AJCC T Category: pT2 AJCC N Category: cN2 AJCC M Category: cM0 AJCC S Category Post-Orchiectomy: SX AJCC 8 Stage Grouping: IIB Histology: Seminoma Intent of Therapy: Curative Intent, Discussed with Patient

## 2020-04-12 ENCOUNTER — Other Ambulatory Visit: Payer: Self-pay | Admitting: Student

## 2020-04-12 NOTE — Patient Instructions (Signed)
Interventional radiology phone numbers [phone] After hours [phone]    You have skin glue (dermabond) over your new port. Do not use the lidocaine cream (EMLA cream) over the skin glue until it has healed. The petroleum in the lidocaine cream will dissolve the skin glue resulting in an infection of your new port. Use ice in a zip lock bag for 1-2 minutes over your new port before the cancer center nurses access your port.   Implanted Port Insertion, Care After This sheet gives you information about how to care for yourself after your procedure. Your health care provider may also give you more specific instructions. If you have problems or questions, contact your health care provider. What can I expect after the procedure? After the procedure, it is common to have:  Discomfort at the port insertion site.  Bruising on the skin over the port. This should improve over 3-4 days. Follow these instructions at home: [facility] care  After your port is placed, you will get a manufacturer's information card. The card has information about your port. Keep this card with you at all times.  Take care of the port as told by your health care provider. Ask your health care provider if you or a family member can get training for taking care of the port at home. A home health care nurse may also take care of the port.  Make sure to remember what type of port you have. Incision care  Follow instructions from your health care provider about how to take care of your port insertion site. Make sure you: ? Wash your hands with soap and water before and after you change your bandage (dressing). If soap and water are not available, use hand sanitizer. ? Change your dressing as told by your health care provider.  Leave  skin glue in place. These skin closures may need to stay in place for 2 weeks or longer. Check your port insertion site every day for signs of infection. Check for: ? Redness, swelling, or  pain. ? Fluid or blood. ? Warmth. ? Pus or a bad smell.      Activity  Return to your normal activities as told by your health care provider. Ask your health care provider what activities are safe for you.  Do not lift anything that is heavier than 10 lb (4.5 kg), or the limit that you are told, until your health care provider says that it is safe. General instructions  Take over-the-counter and prescription medicines only as told by your health care provider.  Do not take baths, swim, or use a hot tub until your health care provider approves. You may shower tomorrow around 1 PM.  Do not drive for 24 hours if you were given a sedative during your procedure.  Wear a medical alert bracelet in case of an emergency. This will tell any health care providers that you have a port.  Keep all follow-up visits as told by your health care provider. This is important. Contact a health care provider if:  You cannot flush your port with saline as directed, or you cannot draw blood from the port.  You have a fever or chills.  You have redness, swelling, or pain around your port insertion site.  You have fluid or blood coming from your port insertion site.  Your port insertion site feels warm to the touch.  You have pus or a bad smell coming from the port insertion site. Get help right away if:  You have chest pain or shortness of breath.  You have bleeding from your port that you cannot control. Summary  Take care of the port as told by your health care provider. Keep the manufacturer's information card with you at all times.  Change your dressing as told by your health care provider.  Contact a health care provider if you have a fever or chills or if you have redness, swelling, or pain around your port insertion site.  Keep all follow-up visits as told by your health care provider. This information is not intended to replace advice given to you by your health care provider. Make  sure you discuss any questions you have with your health care provider. Document Revised: 07/28/2017 Document Reviewed: 07/28/2017 Elsevier Patient Education  2021 [place].    Moderate Conscious Sedation, Adult, Care After This sheet gives you information about how to care for yourself after your procedure. Your health care provider may also give you more specific instructions. If you have problems or questions, contact your health care provider. What can I expect after the procedure? After the procedure, it is common to have:  Sleepiness for several hours.  Impaired judgment for several hours.  Difficulty with balance.  Vomiting if you eat too soon. Follow these instructions at home: For the time period you were told by your health care provider:  Rest.  Do not participate in activities where you could fall or become injured.  Do not drive or use machinery.  Do not drink alcohol.  Do not take sleeping pills or medicines that cause drowsiness.  Do not make important decisions or sign legal documents.  Do not take care of children on your own.        Eating and drinking  Follow the diet recommended by your health care provider.  Drink enough fluid to keep your urine pale yellow.  If you vomit: ? Drink water, juice, or soup when you can drink without vomiting. ? Make sure you have little or no nausea before eating solid foods.    General instructions  Take over-the-counter and prescription medicines only as told by your health care provider.  Have a responsible adult stay with you for the time you are told. It is important to have someone help care for you until you are awake and alert.  Do not smoke.  Keep all follow-up visits as told by your health care provider. This is important. Contact a health care provider if:  You are still sleepy or having trouble with balance after 24 hours.  You feel light-headed.  You keep feeling nauseous or you keep  vomiting.  You develop a rash.  You have a fever.  You have redness or swelling around the IV site. Get help right away if:  You have trouble breathing.  You have new-onset confusion at home. Summary  After the procedure, it is common to feel sleepy, have impaired judgment, or feel nauseous if you eat too soon.  Rest after you get home. Know the things you should not do after the procedure.  Follow the diet recommended by your health care provider and drink enough fluid to keep your urine pale yellow.  Get help right away if you have trouble breathing or new-onset confusion at home. This information is not intended to replace advice given to you by your health care provider. Make sure you discuss any questions you have with your health care provider. Document Revised: 04/29/2019 Document Reviewed: 11/25/2018 Elsevier Patient Education  [place].

## 2020-04-13 ENCOUNTER — Encounter (HOSPITAL_COMMUNITY): Payer: Self-pay

## 2020-04-13 ENCOUNTER — Ambulatory Visit (HOSPITAL_COMMUNITY)
Admission: RE | Admit: 2020-04-13 | Discharge: 2020-04-13 | Disposition: A | Discharge status: Home / Self Care | Payer: BC Managed Care – PPO | Source: Ambulatory Visit | Attending: Oncology | Admitting: Oncology

## 2020-04-13 ENCOUNTER — Telehealth: Payer: Self-pay | Admitting: Oncology

## 2020-04-13 ENCOUNTER — Other Ambulatory Visit: Payer: Self-pay

## 2020-04-13 ENCOUNTER — Other Ambulatory Visit: Payer: Self-pay | Admitting: Oncology

## 2020-04-13 DIAGNOSIS — E785 Hyperlipidemia, unspecified: Secondary | ICD-10-CM | POA: Insufficient documentation

## 2020-04-13 DIAGNOSIS — C6292 Malignant neoplasm of left testis, unspecified whether descended or undescended: Secondary | ICD-10-CM | POA: Insufficient documentation

## 2020-04-13 DIAGNOSIS — Z880 Allergy status to penicillin: Secondary | ICD-10-CM | POA: Insufficient documentation

## 2020-04-13 DIAGNOSIS — Z452 Encounter for adjustment and management of vascular access device: Secondary | ICD-10-CM | POA: Diagnosis not present

## 2020-04-13 DIAGNOSIS — Z79899 Other long term (current) drug therapy: Secondary | ICD-10-CM | POA: Diagnosis not present

## 2020-04-13 DIAGNOSIS — C6290 Malignant neoplasm of unspecified testis, unspecified whether descended or undescended: Secondary | ICD-10-CM | POA: Diagnosis not present

## 2020-04-13 HISTORY — PX: IR IMAGING GUIDED PORT INSERTION: IMG5740

## 2020-04-13 MED ORDER — HEPARIN SOD (PORK) LOCK FLUSH 100 UNIT/ML IV SOLN
INTRAVENOUS | Status: AC
  Filled 2020-04-13: qty 5

## 2020-04-13 MED ORDER — HEPARIN SOD (PORK) LOCK FLUSH 100 UNIT/ML IV SOLN
INTRAVENOUS | Status: AC | PRN
  Administered 2020-04-13: 500 [IU] via INTRAVENOUS

## 2020-04-13 MED ORDER — SODIUM CHLORIDE 0.9 % IV SOLN: INTRAVENOUS | Status: DC

## 2020-04-13 MED ORDER — LIDOCAINE-EPINEPHRINE 1 %-1:100000 IJ SOLN
INTRAMUSCULAR | Status: AC | PRN
  Administered 2020-04-13 (×2): 10 mL via INTRADERMAL

## 2020-04-13 MED ORDER — LIDOCAINE-EPINEPHRINE 1 %-1:100000 IJ SOLN
INTRAMUSCULAR | Status: AC
  Filled 2020-04-13: qty 1

## 2020-04-13 MED ORDER — FENTANYL CITRATE (PF) 100 MCG/2ML IJ SOLN
INTRAMUSCULAR | Status: AC | PRN
  Administered 2020-04-13 (×2): 50 ug via INTRAVENOUS

## 2020-04-13 MED ORDER — MIDAZOLAM HCL 2 MG/2ML IJ SOLN
INTRAMUSCULAR | Status: AC | PRN
  Administered 2020-04-13: 1 mg via INTRAVENOUS
  Administered 2020-04-13: 2 mg via INTRAVENOUS
  Administered 2020-04-13: 1 mg via INTRAVENOUS

## 2020-04-13 MED ORDER — FENTANYL CITRATE (PF) 100 MCG/2ML IJ SOLN
INTRAMUSCULAR | Status: AC
  Filled 2020-04-13: qty 2

## 2020-04-13 MED ORDER — MIDAZOLAM HCL 2 MG/2ML IJ SOLN
INTRAMUSCULAR | Status: AC
  Filled 2020-04-13: qty 4

## 2020-04-13 NOTE — Discharge Instructions (Signed)
Interventional radiology phone numbers [phone] After hours [phone]    You have skin glue (dermabond) over your new port. Do not use the lidocaine cream (EMLA cream) over the skin glue until it has healed. The petroleum in the lidocaine cream will dissolve the skin glue resulting in an infection of your new port. Use ice in a zip lock bag for 1-2 minutes over your new port before the cancer center nurses access your port.   Implanted Port Insertion, Care After This sheet gives you information about how to care for yourself after your procedure. Your health care provider may also give you more specific instructions. If you have problems or questions, contact your health care provider. What can I expect after the procedure? After the procedure, it is common to have:  Discomfort at the port insertion site.  Bruising on the skin over the port. This should improve over 3-4 days. Follow these instructions at home: Port care  After your port is placed, you will get a manufacturer's information card. The card has information about your port. Keep this card with you at all times.  Take care of the port as told by your health care provider. Ask your health care provider if you or a family member can get training for taking care of the port at home. A home health care nurse may also take care of the port.  Make sure to remember what type of port you have. Incision care 1. Follow instructions from your health care provider about how to take care of your port insertion site. Make sure you: ? Wash your hands with soap and water before and after you change your bandage (dressing). If soap and water are not available, use hand sanitizer. ? Change your dressing as told by your health care provider. 2. Leave skin glue strips in place. These skin closures may need to stay in place for 2 weeks or longer.  3. Check your port insertion site every day for signs of infection. Check for: ? Redness,  swelling, or pain. ? Fluid or blood. ? Warmth. ? Pus or a bad smell.      Activity  Return to your normal activities as told by your health care provider. Ask your health care provider what activities are safe for you.  Do not lift anything that is heavier than 10 lb (4.5 kg), or the limit that you are told, until your health care provider says that it is safe. General instructions  Take over-the-counter and prescription medicines only as told by your health care provider.  Do not take baths, swim, or use a hot tub until your health care provider approves.You may remove your dressing tomorrow and shower.  Do not drive for 24 hours if you were given a sedative during your procedure.  Wear a medical alert bracelet in case of an emergency. This will tell any health care providers that you have a port.  Keep all follow-up visits as told by your health care provider. This is important. Contact a health care provider if:  You cannot flush your port with saline as directed, or you cannot draw blood from the port.  You have a fever or chills.  You have redness, swelling, or pain around your port insertion site.  You have fluid or blood coming from your port insertion site.  Your port insertion site feels warm to the touch.  You have pus or a bad smell coming from the port insertion site. Get help right away   if:  You have chest pain or shortness of breath.  You have bleeding from your port that you cannot control. Summary  Take care of the port as told by your health care provider. Keep the manufacturer's information card with you at all times.  Change your dressing as told by your health care provider.  Contact a health care provider if you have a fever or chills or if you have redness, swelling, or pain around your port insertion site.  Keep all follow-up visits as told by your health care provider. This information is not intended to replace advice given to you by your  health care provider. Make sure you discuss any questions you have with your health care provider. Document Revised: 07/28/2017 Document Reviewed: 07/28/2017 Elsevier Patient Education  2021 [employer] Inc[employer]    Moderate Conscious Sedation, Adult, Care After This sheet gives you information about how to care for yourself after your procedure. Your health care provider may also give you more specific instructions. If you have problems or questions, contact your health care provider. What can I expect after the procedure? After the procedure, it is common to have:  Sleepiness for several hours.  Impaired judgment for several hours.  Difficulty with balance.  Vomiting if you eat too soon. Follow these instructions at home: For the time period you were told by your health care provider:  Rest.  Do not participate in activities where you could fall or become injured.  Do not drive or use machinery.  Do not drink alcohol.  Do not take sleeping pills or medicines that cause drowsiness.  Do not make important decisions or sign legal documents.  Do not take care of children on your own.      Eating and drinking 4. Follow the diet recommended by your health care provider. 5. Drink enough fluid to keep your urine pale yellow. 6. If you vomit: ? Drink water, juice, or soup when you can drink without vomiting. ? Make sure you have little or no nausea before eating solid foods.   General instructions  Take over-the-counter and prescription medicines only as told by your health care provider.  Have a responsible adult stay with you for the time you are told. It is important to have someone help care for you until you are awake and alert.  Do not smoke.  Keep all follow-up visits as told by your health care provider. This is important. Contact a health care provider if:  You are still sleepy or having trouble with balance after 24 hours.  You feel light-headed.  You keep feeling  nauseous or you keep vomiting.  You develop a rash.  You have a fever.  You have redness or swelling around the IV site. Get help right away if:  You have trouble breathing.  You have new-onset confusion at home. Summary  After the procedure, it is common to feel sleepy, have impaired judgment, or feel nauseous if you eat too soon.  Rest after you get home. Know the things you should not do after the procedure.  Follow the diet recommended by your health care provider and drink enough fluid to keep your urine pale yellow.  Get help right away if you have trouble breathing or new-onset confusion at home. This information is not intended to replace advice given to you by your health care provider. Make sure you discuss any questions you have with your health care provider. Document Revised: 04/29/2019 Document Reviewed: 11/25/2018 Elsevier Patient Education    2021 [employer] Inc. 

## 2020-04-13 NOTE — Procedures (Signed)
Interventional Radiology Procedure Note  Procedure: Port placement.  Indication: Metastatic testicular seminoma  Findings: Please refer to procedural dictation for full description.  Complications: None  EBL: < 10 mL  Miachel Roux, MD [phone]

## 2020-04-13 NOTE — H&P (Addendum)
Chief Complaint: Patient was seen in consultation today for image guided portacatheter placement at the request of Shadad,Firas N  Referring Physician(s): Wyatt Portela  Supervising Physician: Mir, Sharen Heck  Patient Status: [facility] - Out-pt  History of Present Illness: Kevin Hess is a 50 y.o. 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 Patient is new to our service.  He was in remission for right testicular cancer, and was followed by oncology annually until April 2021 when US scrotum showed a solid mass with increased vascularity concerning for neoplasm or malignancy.  Patient was referred to urology and underwent radical left orchiectomy with Dr. Lovena Neighbours on 05/31/2019.  Patient underwent follow-up CT chest abdomen pelvis with contrast on 09/02/2019 which showed no metastatic lesions. Again, patient was followed by oncology with CT scan every 6 to 12 month as there was no metastatic lesion seen on follow-up CT scan after the left radical orchiectomy.  Unfortunately, follow up CT on 04/04/2020 showed an enlarged left retroperitoneal lymph node consistent with metastatic disease involvement.  After thorough discussion and shared medical decision making with his oncology team, patient decided to undergo chemotherapy.   IR was requested for image guided port-a-catheter placement.  Patient laying in bed, not in acute distress.  Denise headache, fever, chills, shortness of breath, cough, chest pain, abdominal pain, nausea ,vomiting, and bleeding.   Past Medical History:  Diagnosis Date  . Allergy   . Chronic kidney disease 2020   kidney stones   . Complication of anesthesia   . History of blood transfusion 1986  . History of kidney stones   . History of radiation  therapy 2009   right testicular cancer  . Hyperlipidemia   . Malignant neoplasm of other and unspecified testis 2009   testicular right 2009, left 2021   . Mononucleosis   . PONV (postoperative nausea and vomiting)   . Scoliosis     Past Surgical History:  Procedure Laterality Date  . [place]   due to scoliosis 1986 Thoracic fusion 1997 thoracici hardware removal  . EXTRACORPOREAL SHOCK WAVE LITHOTRIPSY  04/2018  . left vastectomy  2011   with local  . ORCHIECTOMY Left 05/31/2019   Procedure: ORCHIECTOMY, RADICAL;  Surgeon: Ceasar Mons, MD;  Location: [facility];  Service: Urology;  Laterality: Left;  . right radical ocrhiectomy  2009  . UPPER GASTROINTESTINAL ENDOSCOPY    . UPPER GI ENDOSCOPY  2010  . WISDOM TOOTH EXTRACTION     x 1 with Versed     Allergies: Penicillins  Medications: Prior to Admission medications   Medication Sig Start Date End Date Taking? Authorizing Provider  Cholecalciferol (VITAMIN D3) 5000 UNITS CAPS Take 5,000 Units by mouth daily. Takes Monday thru friday Patient not taking: No sig reported    [provider]  fluticasone (FLONASE) 50 MCG/ACT nasal spray Place 2 sprays into both nostrils daily.    [provider]  rosuvastatin (CRESTOR) 10 MG tablet Take 10 mg by mouth daily.    [provider]  testosterone cypionate (DEPOTESTOSTERONE CYPIONATE) 200 MG/ML injection SMARTSIG:Milliliter(s) IM 09/30/19   [provider]     Family History  Problem Relation Age of Onset  . Colitis Mother   . Diverticulitis Mother   . Colitis Brother   . Diverticulitis Brother   .  Colon polyps Brother   . Esophageal cancer Neg Hx   . Stomach cancer Neg Hx   . Rectal cancer Neg Hx   . Colon cancer Neg Hx     Social History   Socioeconomic History  . Marital status: Married    Spouse name: Not on file  . Number of children: Not on file  . Years of education: Not on file   . Highest education level: Not on file  Occupational History  . Not on file  Tobacco Use  . Smoking status: Never Smoker  . Smokeless tobacco: Never Used  Vaping Use  . Vaping Use: Never used  Substance and Sexual Activity  . Alcohol use: Never  . Drug use: Never  . Sexual activity: Not on file  Other Topics Concern  . Not on file  Social History Narrative  . Not on file   Social Determinants of Health   Financial Resource Strain: Not on file  Food Insecurity: Not on file  Transportation Needs: Not on file  Physical Activity: Not on file  Stress: Not on file  Social Connections: Not on file     Review of Systems: A 12 point ROS discussed and pertinent positives are indicated in the HPI above.  All other systems are negative.   Vital Signs: BP 138/86   Pulse 80   Temp 98.6 F (37 C) (Oral)   Resp 16   Ht 6\' 1"  (1.854 m)   Wt 197 lb (89.4 kg)   SpO2 100%   BMI 25.99 kg/m   Physical Exam Vitals and nursing note reviewed.  Constitutional:      General: He is not in acute distress.    Appearance: Normal appearance.  HENT:     Head: Normocephalic and atraumatic.     Mouth/Throat:     Mouth: Mucous membranes are moist.     Pharynx: Oropharynx is clear.  Cardiovascular:     Rate and Rhythm: Normal rate and regular rhythm.     Pulses: Normal pulses.     Heart sounds: Normal heart sounds.  Pulmonary:     Effort: Pulmonary effort is normal.     Breath sounds: Normal breath sounds. No wheezing, rhonchi or rales.  Abdominal:     General: Bowel sounds are normal. There is no distension.     Palpations: Abdomen is soft.  Skin:    General: Skin is warm and dry.  Neurological:     Mental Status: He is alert and oriented to person, place, and time.  Psychiatric:        Mood and Affect: Mood normal.        Behavior: Behavior normal.     MD Evaluation Airway: WNL Heart: WNL Abdomen: WNL Chest/ Lungs: WNL ASA  Classification: 3 Mallampati/Airway Score:  Two  Imaging: CT CHEST ABDOMEN PELVIS W CONTRAST  Result Date: 04/04/2020 CLINICAL DATA:  History of testicular cancer, restaging. EXAM: CT CHEST, ABDOMEN, AND PELVIS WITH CONTRAST TECHNIQUE: Multidetector CT imaging of the chest, abdomen and pelvis was performed following the standard protocol during bolus administration of intravenous contrast. CONTRAST:  132mL OMNIPAQUE IOHEXOL 300 MG/ML  SOLN COMPARISON:  Multiple priors including CT chest abdomen pelvis December 05, 2019 FINDINGS: CT CHEST FINDINGS Cardiovascular: Normal size heart. No significant pericardial effusion/thickening. Unchanged size of the 4.3 cm ascending thoracic aortic aneurysm. No central pulmonary embolus. Mediastinum/Nodes: Thyroid is grossly unremarkable without discrete nodularity. No pathologically enlarged mediastinal, hilar or axillary lymph nodes. Trachea esophagus are grossly unremarkable.  Lungs/Pleura: Similar mild biapical pleuroparenchymal scarring. No focal consolidation. No suspicious pulmonary nodules or masses. No pleural effusion. No pneumothorax. Musculoskeletal: Dextroscoliosis of the thoracic spine with thoracolumbar fixation rods. No suspicious lytic or blastic lesions of bone. CT ABDOMEN PELVIS FINDINGS Hepatobiliary: Similar size and appearance of the subcentimeter hypodense hepatic lesions for instance the 7 mm lesion in the posterior right lobe of the liver on image 66/2 and the 4 mm lesion in the right hepatic dome on image 53/2, these are technically too small to accurately characterize but favored to represent benign hepatic cysts. Gallbladder is unremarkable. No biliary ductal dilation. Pancreas: Unremarkable. No pancreatic ductal dilatation or surrounding inflammatory changes. Spleen: Normal in size without focal abnormality. Adrenals/Urinary Tract: Similar mild nodular thickening of the left adrenal gland measuring up to 8 mm which is likely benign. Right adrenal glands unremarkable. No hydronephrosis.  Symmetric enhancement excretion of contrast in the bilateral kidneys. No suspicious filling defect visualized within the opacified portions of the collecting system and ureters on delayed imaging. Urinary bladder is grossly unremarkable for degree of distension. Stomach/Bowel: Stomach is grossly unremarkable for degree of distension. Normal positioning of the duodenum/ligament of [place]. No suspicious small bowel wall thickening or dilation. The appendix and terminal ileum appear unremarkable. No suspicious colonic wall thickening or mass like lesions. Descending and sigmoid colonic diverticulosis without findings of acute diverticulitis. Vascular/Lymphatic: Aortic atherosclerosis without aneurysmal dilation of the abdominal aorta. There are new pathologically enlarged left retroperitoneal lymph nodes for instance a 2.8 x 2.8 cm left periaortic lymph node on image 74/2 at the level of the renal hilum. Left periaortic lymph node measuring 2 cm just inferior to the left kidney on image 81/2. No pathologically enlarged gastrohepatic or periportal lymph nodes. No pathologically enlarged pelvic lymph nodes. Reproductive: Status post left orchiectomy. Dystrophic prostatic calcifications otherwise the prostate is grossly unremarkable. Other: No abdominopelvic ascites. Musculoskeletal: Lumbar levoscoliosis. Multilevel degenerative changes spine. No aggressive lytic or blastic lesions bone. IMPRESSION: 1. New pathologically enlarged left retroperitoneal lymph nodes, consistent with metastatic disease involvement. 2. Status post left orchiectomy. No evidence of metastatic disease in the thorax. 3. Similar mild nodular thickening of the left adrenal gland measuring up to 8 mm which is likely benign, continued attention on follow-up imaging is recommended. 4. Unchanged size of the 4.3 cm ascending thoracic aortic aneurysm. Recommend annual imaging followup by CTA or MRA. However, currently continue surveillance on oncologic  imaging is sufficient in this patient. This recommendation follows 2010 ACCF/AHA/AATS/ACR/ASA/SCA/SCAI/SIR/STS/SVM Guidelines for the Diagnosis and Management of Patients with Thoracic Aortic Disease. Circulation. 2010; 121: P619-J093. Aortic aneurysm NOS (ICD10-I71.9) 5. Descending and sigmoid colonic diverticulosis without findings of acute diverticulitis. 6. Aortic atherosclerosis.  Aortic Atherosclerosis (ICD10-I70.0). These results will be called to the ordering clinician or representative by the Radiologist Assistant, and communication documented in the PACS or [employer]. Electronically Signed   By: Dahlia Bailiff MD   On: 04/04/2020 11:52    Labs:  CBC: Recent Labs    08/30/19 0900  WBC 4.0  HGB 16.6  HCT 50.1  PLT 166    COAGS: No results for input(s): INR, APTT in the last 8760 hours.  BMP: Recent Labs    08/30/19 0900  NA 139  K 4.6  CL 105  CO2 26  GLUCOSE 106*  BUN 9  CALCIUM 9.9  CREATININE 0.95  GFRNONAA >60  GFRAA >60    LIVER FUNCTION TESTS: Recent Labs    08/30/19 0900  BILITOT 0.6  AST  17  ALT 20  ALKPHOS 43  PROT 7.1  ALBUMIN 4.3    TUMOR MARKERS: No results for input(s): AFPTM, CEA, CA199, CHROMGRNA in the last 8760 hours.  Assessment and Plan:  Metastatic testicular seminoma  IR was requested for image guided Port-A-Catheter placement.  Patient presents to IR today for the procedure. N.p.o. since midnight.  Risks and benefits of image guided port-a-catheter placement was discussed with the patient including, but not limited to bleeding, infection, pneumothorax, or fibrin sheath development and need for additional procedures.  All of the patient's questions were answered, patient is agreeable to proceed. Consent signed and in chart.    Thank you for this interesting consult.  I greatly enjoyed meeting [facility] and look forward to participating in their care.  A copy of this report was sent to the requesting provider on this  date.  Electronically Signed: Tera Mater, PA-C 04/13/2020, 1:06 PM   I spent a total of  15 Minutes   in face to face in clinical consultation, greater than 50% of which was counseling/coordinating care for image guided pleural catheter placement

## 2020-04-13 NOTE — Telephone Encounter (Signed)
Left message with upcoming appointments. Gave option to call back to reschedule if needed. 

## 2020-04-16 ENCOUNTER — Other Ambulatory Visit: Payer: Self-pay | Admitting: Oncology

## 2020-04-16 ENCOUNTER — Inpatient Hospital Stay: Payer: BC Managed Care – PPO | Admitting: Oncology

## 2020-04-16 DIAGNOSIS — Z1331 Encounter for screening for depression: Secondary | ICD-10-CM | POA: Diagnosis not present

## 2020-04-16 DIAGNOSIS — I10 Essential (primary) hypertension: Secondary | ICD-10-CM | POA: Diagnosis not present

## 2020-04-16 DIAGNOSIS — Z8547 Personal history of malignant neoplasm of testis: Secondary | ICD-10-CM | POA: Diagnosis not present

## 2020-04-16 MED ORDER — PROCHLORPERAZINE MALEATE 10 MG PO TABS: 10.0000 mg | ORAL_TABLET | Freq: Four times a day (QID) | ORAL | 0 refills | Status: DC | PRN

## 2020-04-16 NOTE — Progress Notes (Signed)
Pharmacist Chemotherapy Monitoring - Initial Assessment    Anticipated start date: 04/23/20  Regimen:  . Are orders appropriate based on the patient's diagnosis, regimen, and cycle? Yes . Does the plan date match the patient's scheduled date? Yes . Is the sequencing of drugs appropriate? Yes . Are the premedications appropriate for the patient's regimen? Yes . Prior Authorization for treatment is: Pending o If applicable, is the correct biosimilar selected based on the patient's insurance? not applicable  Organ Function and Labs: [name] Are dose adjustments needed based on the patient's renal function, hepatic function, or hematologic function? Yes . Are appropriate labs ordered prior to the start of patient's treatment? Yes . Other organ system assessment, if indicated: bleomycin: PFTs . The following baseline labs, if indicated, have been ordered: cisplatin: K, Mg  Dose Assessment: . Are the drug doses appropriate? Yes . Are the following correct: o Drug concentrations Yes o IV fluid compatible with drug Yes o Administration routes Yes o Timing of therapy Yes . If applicable, does the patient have documented access for treatment and/or plans for port-a-cath placement? yes . If applicable, have lifetime cumulative doses been properly documented and assessed? not applicable Lifetime Dose Tracking  No doses have been documented on this patient for the following tracked chemicals: Doxorubicin, Epirubicin, Idarubicin, Daunorubicin, Mitoxantrone, Bleomycin, Oxaliplatin, Carboplatin, Liposomal Doxorubicin  o   Toxicity Monitoring/Prevention: . The patient has the following take home antiemetics prescribed: N/A . The patient has the following take home medications prescribed: N/A . Medication allergies and previous infusion related reactions, if applicable, have been reviewed and addressed. Yes . The patient's current medication list has been assessed for drug-drug interactions with their  chemotherapy regimen. no significant drug-drug interactions were identified on review.  Order Review: . Are the treatment plan orders signed? Yes . Is the patient scheduled to see a provider prior to their treatment? No  I verify that I have reviewed each item in the above checklist and answered each question accordingly.  Philomena Course, [place], 04/16/2020  8:52 AM

## 2020-04-18 ENCOUNTER — Encounter: Payer: Self-pay | Admitting: Oncology

## 2020-04-18 ENCOUNTER — Other Ambulatory Visit: Payer: Self-pay

## 2020-04-18 ENCOUNTER — Inpatient Hospital Stay: Payer: BC Managed Care – PPO | Attending: Oncology

## 2020-04-18 DIAGNOSIS — Z923 Personal history of irradiation: Secondary | ICD-10-CM | POA: Insufficient documentation

## 2020-04-18 DIAGNOSIS — Z8547 Personal history of malignant neoplasm of testis: Secondary | ICD-10-CM | POA: Insufficient documentation

## 2020-04-18 DIAGNOSIS — Z79899 Other long term (current) drug therapy: Secondary | ICD-10-CM | POA: Insufficient documentation

## 2020-04-18 DIAGNOSIS — C6292 Malignant neoplasm of left testis, unspecified whether descended or undescended: Secondary | ICD-10-CM | POA: Insufficient documentation

## 2020-04-18 DIAGNOSIS — Z5111 Encounter for antineoplastic chemotherapy: Secondary | ICD-10-CM | POA: Insufficient documentation

## 2020-04-18 NOTE — Progress Notes (Signed)
Met with patient at registration to introduce myself as Arboriculturist and to offer available resources.  Discussed one-time $1000 Radio broadcast assistant to assist with personal expenses while going through treatment.  Gave hum my card if interested in applying and for any additional financial questions or concerns.

## 2020-04-20 ENCOUNTER — Other Ambulatory Visit: Payer: Self-pay

## 2020-04-20 DIAGNOSIS — E291 Testicular hypofunction: Secondary | ICD-10-CM | POA: Diagnosis not present

## 2020-04-20 NOTE — Progress Notes (Signed)
Entered chart in error.

## 2020-04-23 ENCOUNTER — Inpatient Hospital Stay: Payer: BC Managed Care – PPO

## 2020-04-23 ENCOUNTER — Other Ambulatory Visit: Payer: Self-pay

## 2020-04-23 VITALS — BP 130/84 | HR 83 | Temp 99.0°F | Resp 18 | Ht 73.0 in | Wt 200.0 lb

## 2020-04-23 DIAGNOSIS — C6292 Malignant neoplasm of left testis, unspecified whether descended or undescended: Secondary | ICD-10-CM | POA: Diagnosis not present

## 2020-04-23 DIAGNOSIS — Z923 Personal history of irradiation: Secondary | ICD-10-CM | POA: Diagnosis not present

## 2020-04-23 DIAGNOSIS — Z5111 Encounter for antineoplastic chemotherapy: Secondary | ICD-10-CM | POA: Diagnosis not present

## 2020-04-23 DIAGNOSIS — C6212 Malignant neoplasm of descended left testis: Secondary | ICD-10-CM

## 2020-04-23 DIAGNOSIS — Z8547 Personal history of malignant neoplasm of testis: Secondary | ICD-10-CM | POA: Diagnosis not present

## 2020-04-23 DIAGNOSIS — Z79899 Other long term (current) drug therapy: Secondary | ICD-10-CM | POA: Diagnosis not present

## 2020-04-23 LAB — CBC WITH DIFFERENTIAL (CANCER CENTER ONLY)
Abs Immature Granulocytes: 0.01 10*3/uL (ref 0.00–0.07)
Basophils Absolute: 0 10*3/uL (ref 0.0–0.1)
Basophils Relative: 1 %
Eosinophils Absolute: 0 10*3/uL (ref 0.0–0.5)
Eosinophils Relative: 1 %
HCT: 45 % (ref 39.0–52.0)
Hemoglobin: 15.5 g/dL (ref 13.0–17.0)
Immature Granulocytes: 0 %
Lymphocytes Relative: 27 %
Lymphs Abs: 0.9 10*3/uL (ref 0.7–4.0)
MCH: 30.3 pg (ref 26.0–34.0)
MCHC: 34.4 g/dL (ref 30.0–36.0)
MCV: 87.9 fL (ref 80.0–100.0)
Monocytes Absolute: 0.4 10*3/uL (ref 0.1–1.0)
Monocytes Relative: 12 %
Neutro Abs: 2 10*3/uL (ref 1.7–7.7)
Neutrophils Relative %: 59 %
Platelet Count: 132 10*3/uL — ABNORMAL LOW (ref 150–400)
RBC: 5.12 MIL/uL (ref 4.22–5.81)
RDW: 12.3 % (ref 11.5–15.5)
WBC Count: 3.3 10*3/uL — ABNORMAL LOW (ref 4.0–10.5)
nRBC: 0 % (ref 0.0–0.2)

## 2020-04-23 LAB — CMP (CANCER CENTER ONLY)
ALT: 17 U/L (ref 0–44)
AST: 20 U/L (ref 15–41)
Albumin: 4.3 g/dL (ref 3.5–5.0)
Alkaline Phosphatase: 49 U/L (ref 38–126)
Anion gap: 12 (ref 5–15)
BUN: 12 mg/dL (ref 6–20)
CO2: 21 mmol/L — ABNORMAL LOW (ref 22–32)
Calcium: 9.2 mg/dL (ref 8.9–10.3)
Chloride: 106 mmol/L (ref 98–111)
Creatinine: 0.84 mg/dL (ref 0.61–1.24)
GFR, Estimated: >60 mL/min (ref >60)
Glucose, Bld: 128 mg/dL — ABNORMAL HIGH (ref 70–99)
Potassium: 3.9 mmol/L (ref 3.5–5.1)
Sodium: 139 mmol/L (ref 135–145)
Total Bilirubin: 0.5 mg/dL (ref 0.3–1.2)
Total Protein: 7.3 g/dL (ref 6.5–8.1)

## 2020-04-23 LAB — MAGNESIUM: Magnesium: 1.9 mg/dL (ref 1.7–2.4)

## 2020-04-23 LAB — LACTATE DEHYDROGENASE: LDH: 218 U/L — ABNORMAL HIGH (ref 98–192)

## 2020-04-23 MED ORDER — SODIUM CHLORIDE 0.9 % IV SOLN
20.0000 mg/m2 | Freq: Once | INTRAVENOUS | Status: AC
  Administered 2020-04-23: 46 mg via INTRAVENOUS
  Filled 2020-04-23: qty 46

## 2020-04-23 MED ORDER — SODIUM CHLORIDE 0.9 % IV SOLN
10.0000 mg | Freq: Once | INTRAVENOUS | Status: AC
  Administered 2020-04-23: 10 mg via INTRAVENOUS
  Filled 2020-04-23: qty 10

## 2020-04-23 MED ORDER — SODIUM CHLORIDE 0.9 % IV SOLN
Freq: Once | INTRAVENOUS | Status: AC
  Filled 2020-04-23: qty 250

## 2020-04-23 MED ORDER — SODIUM CHLORIDE 0.9 % IV SOLN
150.0000 mg | Freq: Once | INTRAVENOUS | Status: AC
  Administered 2020-04-23: 150 mg via INTRAVENOUS
  Filled 2020-04-23: qty 150

## 2020-04-23 MED ORDER — SODIUM CHLORIDE 0.9% FLUSH
10.0000 mL | INTRAVENOUS | Status: DC | PRN
  Administered 2020-04-23: 10 mL
  Filled 2020-04-23: qty 10

## 2020-04-23 MED ORDER — MAGNESIUM SULFATE 2 GM/50ML IV SOLN
INTRAVENOUS | Status: AC
  Filled 2020-04-23: qty 50

## 2020-04-23 MED ORDER — SODIUM CHLORIDE 0.9% FLUSH
10.0000 mL | INTRAVENOUS | Status: AC | PRN
  Administered 2020-04-23: 10 mL
  Filled 2020-04-23: qty 10

## 2020-04-23 MED ORDER — SODIUM CHLORIDE 0.9 % IV SOLN
100.0000 mg/m2 | Freq: Once | INTRAVENOUS | Status: AC
  Administered 2020-04-23: 230 mg via INTRAVENOUS
  Filled 2020-04-23: qty 11.5

## 2020-04-23 MED ORDER — POTASSIUM CHLORIDE IN NACL 20-0.9 MEQ/L-% IV SOLN
Freq: Once | INTRAVENOUS | Status: AC
  Filled 2020-04-23: qty 1000

## 2020-04-23 MED ORDER — MAGNESIUM SULFATE 2 GM/50ML IV SOLN
2.0000 g | Freq: Once | INTRAVENOUS | Status: AC
  Administered 2020-04-23: 2 g via INTRAVENOUS

## 2020-04-23 MED ORDER — HEPARIN SOD (PORK) LOCK FLUSH 100 UNIT/ML IV SOLN
500.0000 [IU] | Freq: Once | INTRAVENOUS | Status: AC | PRN
  Administered 2020-04-23: 500 [IU]
  Filled 2020-04-23: qty 5

## 2020-04-23 MED ORDER — PALONOSETRON HCL INJECTION 0.25 MG/5ML
0.2500 mg | Freq: Once | INTRAVENOUS | Status: AC
Start: 2020-04-23 — End: 2020-04-23
  Administered 2020-04-23: 0.25 mg via INTRAVENOUS

## 2020-04-23 MED ORDER — PALONOSETRON HCL INJECTION 0.25 MG/5ML
INTRAVENOUS | Status: AC
  Filled 2020-04-23: qty 5

## 2020-04-23 NOTE — Progress Notes (Signed)
Information about each chemotherapy received printed from [email or web address] and given to pt.

## 2020-04-23 NOTE — Patient Instructions (Signed)
[facility] Discharge Instructions for Patients Receiving Chemotherapy  Today you received the following chemotherapy agents:  Etoposide and Cisplatin.  To help prevent nausea and vomiting after your treatment, we encourage you to take your nausea medication as directed.   If you develop nausea and vomiting that is not controlled by your nausea medication, call the clinic.   BELOW ARE SYMPTOMS THAT SHOULD BE REPORTED IMMEDIATELY:  *FEVER GREATER THAN 100.5 F  *CHILLS WITH OR WITHOUT FEVER  NAUSEA AND VOMITING THAT IS NOT CONTROLLED WITH YOUR NAUSEA MEDICATION  *UNUSUAL SHORTNESS OF BREATH  *UNUSUAL BRUISING OR BLEEDING  TENDERNESS IN MOUTH AND THROAT WITH OR WITHOUT PRESENCE OF ULCERS  *URINARY PROBLEMS  *BOWEL PROBLEMS  UNUSUAL RASH Items with * indicate a potential emergency and should be followed up as soon as possible.  Feel free to call the clinic should you have any questions or concerns. The clinic phone number is [phone].  Please show the CHEMO ALERT CARD at check-in to the Emergency Department and triage nurse.   

## 2020-04-24 ENCOUNTER — Inpatient Hospital Stay: Payer: BC Managed Care – PPO

## 2020-04-24 VITALS — BP 124/78 | HR 99 | Temp 98.6°F | Resp 18

## 2020-04-24 DIAGNOSIS — C6292 Malignant neoplasm of left testis, unspecified whether descended or undescended: Secondary | ICD-10-CM | POA: Diagnosis not present

## 2020-04-24 DIAGNOSIS — Z5111 Encounter for antineoplastic chemotherapy: Secondary | ICD-10-CM | POA: Diagnosis not present

## 2020-04-24 DIAGNOSIS — Z923 Personal history of irradiation: Secondary | ICD-10-CM | POA: Diagnosis not present

## 2020-04-24 DIAGNOSIS — Z79899 Other long term (current) drug therapy: Secondary | ICD-10-CM | POA: Diagnosis not present

## 2020-04-24 DIAGNOSIS — Z8547 Personal history of malignant neoplasm of testis: Secondary | ICD-10-CM | POA: Diagnosis not present

## 2020-04-24 LAB — BETA HCG QUANT (REF LAB): hCG Quant: <1 m[IU]/mL (ref 0–3)

## 2020-04-24 LAB — AFP TUMOR MARKER: AFP, Serum, Tumor Marker: 2.7 ng/mL (ref 0.0–6.9)

## 2020-04-24 MED ORDER — SODIUM CHLORIDE 0.9 % IV SOLN
100.0000 mg/m2 | Freq: Once | INTRAVENOUS | Status: AC
  Administered 2020-04-24: 230 mg via INTRAVENOUS
  Filled 2020-04-24: qty 11.5

## 2020-04-24 MED ORDER — SODIUM CHLORIDE 0.9 % IV SOLN
10.0000 mg | Freq: Once | INTRAVENOUS | Status: AC
  Administered 2020-04-24: 10 mg via INTRAVENOUS
  Filled 2020-04-24: qty 10

## 2020-04-24 MED ORDER — MAGNESIUM SULFATE 2 GM/50ML IV SOLN
INTRAVENOUS | Status: AC
  Filled 2020-04-24: qty 50

## 2020-04-24 MED ORDER — SODIUM CHLORIDE 0.9 % IV SOLN
30.0000 [IU] | Freq: Once | INTRAVENOUS | Status: AC
Start: 2020-04-24 — End: 2020-04-24
  Administered 2020-04-24: 30 [IU] via INTRAVENOUS
  Filled 2020-04-24: qty 10

## 2020-04-24 MED ORDER — HEPARIN SOD (PORK) LOCK FLUSH 100 UNIT/ML IV SOLN
500.0000 [IU] | Freq: Once | INTRAVENOUS | Status: AC | PRN
  Administered 2020-04-24: 500 [IU]
  Filled 2020-04-24: qty 5

## 2020-04-24 MED ORDER — SODIUM CHLORIDE 0.9 % IV SOLN
20.0000 mg/m2 | Freq: Once | INTRAVENOUS | Status: AC
  Administered 2020-04-24: 46 mg via INTRAVENOUS
  Filled 2020-04-24: qty 46

## 2020-04-24 MED ORDER — SODIUM CHLORIDE 0.9 % IV SOLN
Freq: Once | INTRAVENOUS | Status: AC
  Filled 2020-04-24: qty 250

## 2020-04-24 MED ORDER — POTASSIUM CHLORIDE IN NACL 20-0.9 MEQ/L-% IV SOLN
Freq: Once | INTRAVENOUS | Status: AC
  Filled 2020-04-24: qty 1000

## 2020-04-24 MED ORDER — SODIUM CHLORIDE 0.9% FLUSH
10.0000 mL | INTRAVENOUS | Status: DC | PRN
  Administered 2020-04-24: 10 mL
  Filled 2020-04-24: qty 10

## 2020-04-24 MED ORDER — MAGNESIUM SULFATE 2 GM/50ML IV SOLN
2.0000 g | Freq: Once | INTRAVENOUS | Status: AC
Start: 2020-04-24 — End: 2020-04-24
  Administered 2020-04-24: 2 g via INTRAVENOUS

## 2020-04-24 NOTE — Patient Instructions (Signed)
[facility] Discharge Instructions for Patients Receiving Chemotherapy  Today you received the following chemotherapy agents Bleomycin; Cisplatin; Etoposide  To help prevent nausea and vomiting after your treatment, we encourage you to take your nausea medication as directed    If you develop nausea and vomiting that is not controlled by your nausea medication, call the clinic.   BELOW ARE SYMPTOMS THAT SHOULD BE REPORTED IMMEDIATELY:  *FEVER GREATER THAN 100.5 F  *CHILLS WITH OR WITHOUT FEVER  NAUSEA AND VOMITING THAT IS NOT CONTROLLED WITH YOUR NAUSEA MEDICATION  *UNUSUAL SHORTNESS OF BREATH  *UNUSUAL BRUISING OR BLEEDING  TENDERNESS IN MOUTH AND THROAT WITH OR WITHOUT PRESENCE OF ULCERS  *URINARY PROBLEMS  *BOWEL PROBLEMS  UNUSUAL RASH Items with * indicate a potential emergency and should be followed up as soon as possible.  Feel free to call the clinic should you have any questions or concerns. The clinic phone number is [phone].  Please show the [place] at check-in to the Emergency Department and triage nurse.

## 2020-04-25 ENCOUNTER — Other Ambulatory Visit: Payer: Self-pay

## 2020-04-25 ENCOUNTER — Inpatient Hospital Stay: Payer: BC Managed Care – PPO

## 2020-04-25 VITALS — BP 135/80 | HR 100 | Temp 97.8°F | Resp 18

## 2020-04-25 DIAGNOSIS — Z923 Personal history of irradiation: Secondary | ICD-10-CM | POA: Diagnosis not present

## 2020-04-25 DIAGNOSIS — C6292 Malignant neoplasm of left testis, unspecified whether descended or undescended: Secondary | ICD-10-CM

## 2020-04-25 DIAGNOSIS — Z8547 Personal history of malignant neoplasm of testis: Secondary | ICD-10-CM | POA: Diagnosis not present

## 2020-04-25 DIAGNOSIS — Z5111 Encounter for antineoplastic chemotherapy: Secondary | ICD-10-CM | POA: Diagnosis not present

## 2020-04-25 DIAGNOSIS — Z79899 Other long term (current) drug therapy: Secondary | ICD-10-CM | POA: Diagnosis not present

## 2020-04-25 MED ORDER — SODIUM CHLORIDE 0.9 % IV SOLN
Freq: Once | INTRAVENOUS | Status: AC
  Filled 2020-04-25: qty 250

## 2020-04-25 MED ORDER — SODIUM CHLORIDE 0.9% FLUSH
10.0000 mL | INTRAVENOUS | Status: AC | PRN
Start: 2020-04-25 — End: 2020-04-25
  Administered 2020-04-25: 10 mL
  Filled 2020-04-25: qty 10

## 2020-04-25 MED ORDER — PALONOSETRON HCL INJECTION 0.25 MG/5ML
INTRAVENOUS | Status: AC
  Filled 2020-04-25: qty 5

## 2020-04-25 MED ORDER — SODIUM CHLORIDE 0.9 % IV SOLN
150.0000 mg | Freq: Once | INTRAVENOUS | Status: AC
  Administered 2020-04-25: 150 mg via INTRAVENOUS
  Filled 2020-04-25: qty 150

## 2020-04-25 MED ORDER — MAGNESIUM SULFATE 2 GM/50ML IV SOLN
INTRAVENOUS | Status: AC
  Filled 2020-04-25: qty 50

## 2020-04-25 MED ORDER — LIDOCAINE-PRILOCAINE 2.5-2.5 % EX CREA: 1.0000 "application " | TOPICAL_CREAM | CUTANEOUS | 3 refills | Status: DC | PRN

## 2020-04-25 MED ORDER — MAGNESIUM SULFATE 2 GM/50ML IV SOLN
2.0000 g | Freq: Once | INTRAVENOUS | Status: AC
  Administered 2020-04-25: 2 g via INTRAVENOUS

## 2020-04-25 MED ORDER — PALONOSETRON HCL INJECTION 0.25 MG/5ML
0.2500 mg | Freq: Once | INTRAVENOUS | Status: AC
  Administered 2020-04-25: 0.25 mg via INTRAVENOUS

## 2020-04-25 MED ORDER — SODIUM CHLORIDE 0.9 % IV SOLN
10.0000 mg | Freq: Once | INTRAVENOUS | Status: AC
  Administered 2020-04-25: 10 mg via INTRAVENOUS
  Filled 2020-04-25: qty 10

## 2020-04-25 MED ORDER — SODIUM CHLORIDE 0.9 % IV SOLN
20.0000 mg/m2 | Freq: Once | INTRAVENOUS | Status: AC
  Administered 2020-04-25: 46 mg via INTRAVENOUS
  Filled 2020-04-25: qty 46

## 2020-04-25 MED ORDER — HEPARIN SOD (PORK) LOCK FLUSH 100 UNIT/ML IV SOLN
500.0000 [IU] | INTRAVENOUS | Status: AC | PRN
  Administered 2020-04-25: 500 [IU]
  Filled 2020-04-25: qty 5

## 2020-04-25 MED ORDER — POTASSIUM CHLORIDE IN NACL 20-0.9 MEQ/L-% IV SOLN
Freq: Once | INTRAVENOUS | Status: AC
  Filled 2020-04-25: qty 1000

## 2020-04-25 MED ORDER — SODIUM CHLORIDE 0.9 % IV SOLN
100.0000 mg/m2 | Freq: Once | INTRAVENOUS | Status: AC
  Administered 2020-04-25: 230 mg via INTRAVENOUS
  Filled 2020-04-25: qty 11.5

## 2020-04-25 NOTE — Patient Instructions (Signed)
[facility] Discharge Instructions for Patients Receiving Chemotherapy  Today you received the following chemotherapy agents Cisplatin and Etoposide  To help prevent nausea and vomiting after your treatment, we encourage you to take your nausea medication Compazine every 6 hours as needed   If you develop nausea and vomiting that is not controlled by your nausea medication, call the clinic.   BELOW ARE SYMPTOMS THAT SHOULD BE REPORTED IMMEDIATELY:  *FEVER GREATER THAN 100.5 F  *CHILLS WITH OR WITHOUT FEVER  NAUSEA AND VOMITING THAT IS NOT CONTROLLED WITH YOUR NAUSEA MEDICATION  *UNUSUAL SHORTNESS OF BREATH  *UNUSUAL BRUISING OR BLEEDING  TENDERNESS IN MOUTH AND THROAT WITH OR WITHOUT PRESENCE OF ULCERS  *URINARY PROBLEMS  *BOWEL PROBLEMS  UNUSUAL RASH Items with * indicate a potential emergency and should be followed up as soon as possible.  Feel free to call the clinic should you have any questions or concerns. The clinic phone number is [phone].  Please show the [place] at check-in to the Emergency Department and triage nurse.

## 2020-04-26 ENCOUNTER — Telehealth: Payer: Self-pay

## 2020-04-26 ENCOUNTER — Inpatient Hospital Stay: Payer: BC Managed Care – PPO

## 2020-04-26 VITALS — BP 126/79 | HR 91 | Temp 98.6°F | Resp 18

## 2020-04-26 DIAGNOSIS — Z5111 Encounter for antineoplastic chemotherapy: Secondary | ICD-10-CM | POA: Diagnosis not present

## 2020-04-26 DIAGNOSIS — Z79899 Other long term (current) drug therapy: Secondary | ICD-10-CM | POA: Diagnosis not present

## 2020-04-26 DIAGNOSIS — C6292 Malignant neoplasm of left testis, unspecified whether descended or undescended: Secondary | ICD-10-CM | POA: Diagnosis not present

## 2020-04-26 DIAGNOSIS — Z8547 Personal history of malignant neoplasm of testis: Secondary | ICD-10-CM | POA: Diagnosis not present

## 2020-04-26 DIAGNOSIS — Z923 Personal history of irradiation: Secondary | ICD-10-CM | POA: Diagnosis not present

## 2020-04-26 MED ORDER — SODIUM CHLORIDE 0.9 % IV SOLN
Freq: Once | INTRAVENOUS | Status: AC
  Filled 2020-04-26: qty 250

## 2020-04-26 MED ORDER — MAGNESIUM SULFATE 2 GM/50ML IV SOLN
2.0000 g | Freq: Once | INTRAVENOUS | Status: AC
  Administered 2020-04-26: 2 g via INTRAVENOUS

## 2020-04-26 MED ORDER — SODIUM CHLORIDE 0.9% FLUSH
10.0000 mL | INTRAVENOUS | Status: DC | PRN
  Administered 2020-04-26: 10 mL
  Filled 2020-04-26: qty 10

## 2020-04-26 MED ORDER — SODIUM CHLORIDE 0.9 % IV SOLN
10.0000 mg | Freq: Once | INTRAVENOUS | Status: AC
  Administered 2020-04-26: 10 mg via INTRAVENOUS
  Filled 2020-04-26: qty 10

## 2020-04-26 MED ORDER — MAGNESIUM SULFATE 2 GM/50ML IV SOLN
INTRAVENOUS | Status: AC
  Filled 2020-04-26: qty 50

## 2020-04-26 MED ORDER — SODIUM CHLORIDE 0.9 % IV SOLN
20.0000 mg/m2 | Freq: Once | INTRAVENOUS | Status: AC
  Administered 2020-04-26: 46 mg via INTRAVENOUS
  Filled 2020-04-26: qty 46

## 2020-04-26 MED ORDER — POTASSIUM CHLORIDE IN NACL 20-0.9 MEQ/L-% IV SOLN
Freq: Once | INTRAVENOUS | Status: AC
  Filled 2020-04-26: qty 1000

## 2020-04-26 MED ORDER — SODIUM CHLORIDE 0.9 % IV SOLN
100.0000 mg/m2 | Freq: Once | INTRAVENOUS | Status: AC
  Administered 2020-04-26: 230 mg via INTRAVENOUS
  Filled 2020-04-26: qty 11.5

## 2020-04-26 MED ORDER — HEPARIN SOD (PORK) LOCK FLUSH 100 UNIT/ML IV SOLN
500.0000 [IU] | Freq: Once | INTRAVENOUS | Status: AC | PRN
Start: 2020-04-26 — End: 2020-04-26
  Administered 2020-04-26: 500 [IU]
  Filled 2020-04-26: qty 5

## 2020-04-26 NOTE — Patient Instructions (Signed)
[facility] Discharge Instructions for Patients Receiving Chemotherapy  Today you received the following chemotherapy agents Cisplatin and Etoposide  To help prevent nausea and vomiting after your treatment, we encourage you to take your nausea medication Compazine every 6 hours as needed   If you develop nausea and vomiting that is not controlled by your nausea medication, call the clinic.   BELOW ARE SYMPTOMS THAT SHOULD BE REPORTED IMMEDIATELY:  *FEVER GREATER THAN 100.5 F  *CHILLS WITH OR WITHOUT FEVER  NAUSEA AND VOMITING THAT IS NOT CONTROLLED WITH YOUR NAUSEA MEDICATION  *UNUSUAL SHORTNESS OF BREATH  *UNUSUAL BRUISING OR BLEEDING  TENDERNESS IN MOUTH AND THROAT WITH OR WITHOUT PRESENCE OF ULCERS  *URINARY PROBLEMS  *BOWEL PROBLEMS  UNUSUAL RASH Items with * indicate a potential emergency and should be followed up as soon as possible.  Feel free to call the clinic should you have any questions or concerns. The clinic phone number is [phone].  Please show the [place] at check-in to the Emergency Department and triage nurse.

## 2020-04-26 NOTE — Telephone Encounter (Signed)
I contacted Respiratory Therapy today to follow up on the PFT that Dr. Alen Blew ordered for the patient. I spoke with Judeen Hammans in [facility] and was able to get the patient scheduled for the PFT on 4/21 at 10 am. Mr. Maxon will need to report to [facility] for this procedure. The patient will also need to complete a COVID screening test prior to the PFT which has been scheduled for 4/18 at 3:05 pm at the [address] location. This information has been relayed to the patient by Terrence Dupont, RN while the patient was here today for his infusion.

## 2020-04-27 ENCOUNTER — Other Ambulatory Visit: Payer: Self-pay

## 2020-04-27 ENCOUNTER — Inpatient Hospital Stay: Payer: BC Managed Care – PPO

## 2020-04-27 VITALS — BP 122/83 | HR 97 | Temp 98.5°F | Resp 18

## 2020-04-27 DIAGNOSIS — Z79899 Other long term (current) drug therapy: Secondary | ICD-10-CM | POA: Diagnosis not present

## 2020-04-27 DIAGNOSIS — Z5111 Encounter for antineoplastic chemotherapy: Secondary | ICD-10-CM | POA: Diagnosis not present

## 2020-04-27 DIAGNOSIS — C6292 Malignant neoplasm of left testis, unspecified whether descended or undescended: Secondary | ICD-10-CM

## 2020-04-27 DIAGNOSIS — Z923 Personal history of irradiation: Secondary | ICD-10-CM | POA: Diagnosis not present

## 2020-04-27 DIAGNOSIS — Z8547 Personal history of malignant neoplasm of testis: Secondary | ICD-10-CM | POA: Diagnosis not present

## 2020-04-27 MED ORDER — SODIUM CHLORIDE 0.9% FLUSH
10.0000 mL | INTRAVENOUS | Status: DC | PRN
  Administered 2020-04-27: 10 mL
  Filled 2020-04-27: qty 10

## 2020-04-27 MED ORDER — SODIUM CHLORIDE 0.9 % IV SOLN
150.0000 mg | Freq: Once | INTRAVENOUS | Status: AC
  Administered 2020-04-27: 150 mg via INTRAVENOUS
  Filled 2020-04-27: qty 150

## 2020-04-27 MED ORDER — SODIUM CHLORIDE 0.9 % IV SOLN
100.0000 mg/m2 | Freq: Once | INTRAVENOUS | Status: AC
  Administered 2020-04-27: 230 mg via INTRAVENOUS
  Filled 2020-04-27: qty 11.5

## 2020-04-27 MED ORDER — PALONOSETRON HCL INJECTION 0.25 MG/5ML
0.2500 mg | Freq: Once | INTRAVENOUS | Status: AC
  Administered 2020-04-27: 0.25 mg via INTRAVENOUS

## 2020-04-27 MED ORDER — PALONOSETRON HCL INJECTION 0.25 MG/5ML
INTRAVENOUS | Status: AC
  Filled 2020-04-27: qty 5

## 2020-04-27 MED ORDER — POTASSIUM CHLORIDE IN NACL 20-0.9 MEQ/L-% IV SOLN
Freq: Once | INTRAVENOUS | Status: AC
  Filled 2020-04-27: qty 1000

## 2020-04-27 MED ORDER — SODIUM CHLORIDE 0.9 % IV SOLN
10.0000 mg | Freq: Once | INTRAVENOUS | Status: AC
  Administered 2020-04-27: 10 mg via INTRAVENOUS
  Filled 2020-04-27: qty 10

## 2020-04-27 MED ORDER — SODIUM CHLORIDE 0.9 % IV SOLN
Freq: Once | INTRAVENOUS | Status: AC
  Filled 2020-04-27: qty 250

## 2020-04-27 MED ORDER — SODIUM CHLORIDE 0.9 % IV SOLN
20.0000 mg/m2 | Freq: Once | INTRAVENOUS | Status: AC
  Administered 2020-04-27: 46 mg via INTRAVENOUS
  Filled 2020-04-27: qty 46

## 2020-04-27 MED ORDER — MAGNESIUM SULFATE 2 GM/50ML IV SOLN
2.0000 g | Freq: Once | INTRAVENOUS | Status: AC
Start: 2020-04-27 — End: 2020-04-27
  Administered 2020-04-27: 2 g via INTRAVENOUS

## 2020-04-27 MED ORDER — HEPARIN SOD (PORK) LOCK FLUSH 100 UNIT/ML IV SOLN
500.0000 [IU] | Freq: Once | INTRAVENOUS | Status: AC | PRN
Start: 2020-04-27 — End: 2020-04-27
  Administered 2020-04-27: 500 [IU]
  Filled 2020-04-27: qty 5

## 2020-04-27 MED ORDER — MAGNESIUM SULFATE 2 GM/50ML IV SOLN
INTRAVENOUS | Status: AC
  Filled 2020-04-27: qty 50

## 2020-04-27 NOTE — Patient Instructions (Signed)
[facility] Discharge Instructions for Patients Receiving Chemotherapy  Today you received the following chemotherapy agents etoposide, cisplatin  To help prevent nausea and vomiting after your treatment, we encourage you to take your nausea medication as directed.   If you develop nausea and vomiting that is not controlled by your nausea medication, call the clinic.   BELOW ARE SYMPTOMS THAT SHOULD BE REPORTED IMMEDIATELY:  *FEVER GREATER THAN 100.5 F  *CHILLS WITH OR WITHOUT FEVER  NAUSEA AND VOMITING THAT IS NOT CONTROLLED WITH YOUR NAUSEA MEDICATION  *UNUSUAL SHORTNESS OF BREATH  *UNUSUAL BRUISING OR BLEEDING  TENDERNESS IN MOUTH AND THROAT WITH OR WITHOUT PRESENCE OF ULCERS  *URINARY PROBLEMS  *BOWEL PROBLEMS  UNUSUAL RASH Items with * indicate a potential emergency and should be followed up as soon as possible.  Feel free to call the clinic should you have any questions or concerns. The clinic phone number is [phone].  Please show the [place] at check-in to the Emergency Department and triage nurse.

## 2020-04-30 ENCOUNTER — Other Ambulatory Visit (HOSPITAL_COMMUNITY)
Admission: RE | Admit: 2020-04-30 | Discharge: 2020-04-30 | Disposition: A | Discharge status: Home / Self Care | Payer: BC Managed Care – PPO | Source: Ambulatory Visit | Attending: Oncology | Admitting: Oncology

## 2020-04-30 DIAGNOSIS — Z20822 Contact with and (suspected) exposure to covid-19: Secondary | ICD-10-CM | POA: Insufficient documentation

## 2020-04-30 DIAGNOSIS — Z01812 Encounter for preprocedural laboratory examination: Secondary | ICD-10-CM | POA: Diagnosis not present

## 2020-04-30 LAB — SARS CORONAVIRUS 2 (TAT 6-24 HRS): SARS Coronavirus 2: NEGATIVE

## 2020-05-01 ENCOUNTER — Inpatient Hospital Stay: Payer: BC Managed Care – PPO

## 2020-05-01 ENCOUNTER — Other Ambulatory Visit: Payer: Self-pay

## 2020-05-01 ENCOUNTER — Inpatient Hospital Stay (HOSPITAL_BASED_OUTPATIENT_CLINIC_OR_DEPARTMENT_OTHER): Payer: BC Managed Care – PPO | Admitting: Oncology

## 2020-05-01 VITALS — HR 94

## 2020-05-01 VITALS — BP 130/87 | HR 109 | Temp 98.7°F | Resp 18 | Ht 73.0 in | Wt 200.9 lb

## 2020-05-01 DIAGNOSIS — Z8547 Personal history of malignant neoplasm of testis: Secondary | ICD-10-CM | POA: Diagnosis not present

## 2020-05-01 DIAGNOSIS — C6292 Malignant neoplasm of left testis, unspecified whether descended or undescended: Secondary | ICD-10-CM

## 2020-05-01 DIAGNOSIS — Z79899 Other long term (current) drug therapy: Secondary | ICD-10-CM | POA: Diagnosis not present

## 2020-05-01 DIAGNOSIS — Z923 Personal history of irradiation: Secondary | ICD-10-CM | POA: Diagnosis not present

## 2020-05-01 DIAGNOSIS — Z5111 Encounter for antineoplastic chemotherapy: Secondary | ICD-10-CM | POA: Diagnosis not present

## 2020-05-01 DIAGNOSIS — Z95828 Presence of other vascular implants and grafts: Secondary | ICD-10-CM | POA: Insufficient documentation

## 2020-05-01 LAB — CMP (CANCER CENTER ONLY)
ALT: 16 U/L (ref 0–44)
AST: 11 U/L — ABNORMAL LOW (ref 15–41)
Albumin: 4.1 g/dL (ref 3.5–5.0)
Alkaline Phosphatase: 43 U/L (ref 38–126)
Anion gap: 8 (ref 5–15)
BUN: 16 mg/dL (ref 6–20)
CO2: 25 mmol/L (ref 22–32)
Calcium: 9.1 mg/dL (ref 8.9–10.3)
Chloride: 101 mmol/L (ref 98–111)
Creatinine: 0.77 mg/dL (ref 0.61–1.24)
GFR, Estimated: >60 mL/min (ref >60)
Glucose, Bld: 105 mg/dL — ABNORMAL HIGH (ref 70–99)
Potassium: 4.6 mmol/L (ref 3.5–5.1)
Sodium: 134 mmol/L — ABNORMAL LOW (ref 135–145)
Total Bilirubin: 0.3 mg/dL (ref 0.3–1.2)
Total Protein: 7 g/dL (ref 6.5–8.1)

## 2020-05-01 LAB — CBC WITH DIFFERENTIAL (CANCER CENTER ONLY)
Abs Immature Granulocytes: 0.02 10*3/uL (ref 0.00–0.07)
Basophils Absolute: 0 10*3/uL (ref 0.0–0.1)
Basophils Relative: 0 %
Eosinophils Absolute: 0 10*3/uL (ref 0.0–0.5)
Eosinophils Relative: 1 %
HCT: 45 % (ref 39.0–52.0)
Hemoglobin: 15.6 g/dL (ref 13.0–17.0)
Immature Granulocytes: 1 %
Lymphocytes Relative: 29 %
Lymphs Abs: 0.7 10*3/uL (ref 0.7–4.0)
MCH: 30.3 pg (ref 26.0–34.0)
MCHC: 34.7 g/dL (ref 30.0–36.0)
MCV: 87.4 fL (ref 80.0–100.0)
Monocytes Absolute: 0 10*3/uL — ABNORMAL LOW (ref 0.1–1.0)
Monocytes Relative: 1 %
Neutro Abs: 1.7 10*3/uL (ref 1.7–7.7)
Neutrophils Relative %: 68 %
Platelet Count: 120 10*3/uL — ABNORMAL LOW (ref 150–400)
RBC: 5.15 MIL/uL (ref 4.22–5.81)
RDW: 12.2 % (ref 11.5–15.5)
WBC Count: 2.5 10*3/uL — ABNORMAL LOW (ref 4.0–10.5)
nRBC: 0 % (ref 0.0–0.2)

## 2020-05-01 LAB — LACTATE DEHYDROGENASE: LDH: 137 U/L (ref 98–192)

## 2020-05-01 MED ORDER — SODIUM CHLORIDE 0.9 % IV SOLN
Freq: Once | INTRAVENOUS | Status: AC
  Filled 2020-05-01: qty 250

## 2020-05-01 MED ORDER — SODIUM CHLORIDE 0.9% FLUSH
10.0000 mL | INTRAVENOUS | Status: DC | PRN
  Administered 2020-05-01: 10 mL
  Filled 2020-05-01: qty 10

## 2020-05-01 MED ORDER — HEPARIN SOD (PORK) LOCK FLUSH 100 UNIT/ML IV SOLN
500.0000 [IU] | Freq: Once | INTRAVENOUS | Status: AC | PRN
  Administered 2020-05-01: 500 [IU]
  Filled 2020-05-01: qty 5

## 2020-05-01 MED ORDER — SODIUM CHLORIDE 0.9% FLUSH
10.0000 mL | Freq: Once | INTRAVENOUS | Status: AC
  Administered 2020-05-01: 10 mL
  Filled 2020-05-01: qty 10

## 2020-05-01 MED ORDER — SODIUM CHLORIDE 0.9 % IV SOLN
30.0000 [IU] | Freq: Once | INTRAVENOUS | Status: AC
  Administered 2020-05-01: 30 [IU] via INTRAVENOUS
  Filled 2020-05-01: qty 10

## 2020-05-01 MED ORDER — PROCHLORPERAZINE MALEATE 10 MG PO TABS
10.0000 mg | ORAL_TABLET | Freq: Once | ORAL | Status: AC
  Administered 2020-05-01: 10 mg via ORAL

## 2020-05-01 MED ORDER — PROCHLORPERAZINE MALEATE 10 MG PO TABS
ORAL_TABLET | ORAL | Status: AC
  Filled 2020-05-01: qty 1

## 2020-05-01 NOTE — Patient Instructions (Signed)
[facility] Discharge Instructions for Patients Receiving Chemotherapy  Today you received the following chemotherapy agents; bleomycin.  To help prevent nausea and vomiting after your treatment, we encourage you to take your nausea medication as directed.   If you develop nausea and vomiting that is not controlled by your nausea medication, call the clinic.   BELOW ARE SYMPTOMS THAT SHOULD BE REPORTED IMMEDIATELY:  *FEVER GREATER THAN 100.5 F  *CHILLS WITH OR WITHOUT FEVER  NAUSEA AND VOMITING THAT IS NOT CONTROLLED WITH YOUR NAUSEA MEDICATION  *UNUSUAL SHORTNESS OF BREATH  *UNUSUAL BRUISING OR BLEEDING  TENDERNESS IN MOUTH AND THROAT WITH OR WITHOUT PRESENCE OF ULCERS  *URINARY PROBLEMS  *BOWEL PROBLEMS  UNUSUAL RASH Items with * indicate a potential emergency and should be followed up as soon as possible.  Feel free to call the clinic should you have any questions or concerns. The clinic phone number is [phone].  Please show the [place] at check-in to the Emergency Department and triage nurse.

## 2020-05-01 NOTE — Progress Notes (Signed)
Hematology and Oncology   Kevin Hess [id number] 1970-06-01 50 y.o. 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  He initially was found to have T2N0 tumor in May 2021.     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      Prior Therapy:  1. Status post right radical orchiectomy with the pathology showed a 2.2 cm seminoma in August 2009.   2. He is status post adjuvant radiation therapy to the pelvis with a total of 26 Gy in 16 fractions concluded in October 2009. 3. He is status post radical orchiectomy on the left completed on Jun 10, 2019.  The final pathology showed a 2.5 cm pure seminoma that is organ confined.  Current therapy:  BEP chemotherapy started on April 23, 2020.  Today is day 9 of cycle 1 of therapy.  Interim History: Kevin Hess  returns today for a follow-up visit.  Since the last visit, he started BEP chemotherapy and completed day 1 through 5 without any major complications.  He does report some dyspepsia, constipation and mild nausea but no vomiting or shortness of breath.  He denies any cough or wheezing.  His performance status quality of life remained reasonable. He did report occasional headaches as well that are manageable with Tylenol.    Medications: Updated on review. Current Outpatient Medications  Medication Sig Dispense Refill  . Cholecalciferol (VITAMIN D3) 5000 UNITS CAPS Take 5,000 Units by mouth daily. Takes Monday thru friday (Patient not taking: No sig reported)    . cyclobenzaprine (FLEXERIL) 10 MG tablet Take 10 mg by mouth 3 (three) times daily as needed for muscle spasms. As needed    . fluticasone (FLONASE) 50 MCG/ACT nasal spray Place 2 sprays into both nostrils daily.    [name] lidocaine-prilocaine (EMLA) cream Apply 1 application topically as needed. 30 g 3   . prochlorperazine (COMPAZINE) 10 MG tablet Take 1 tablet (10 mg total) by mouth every 6 (six) hours as needed for nausea or vomiting. 30 tablet 0  . rosuvastatin (CRESTOR) 10 MG tablet Take 10 mg by mouth daily.    [name] testosterone cypionate (DEPOTESTOSTERONE CYPIONATE) 200 MG/ML injection SMARTSIG:Milliliter(s) IM     No current facility-administered medications for this visit.     Allergies:  Allergies  Allergen Reactions  . Penicillins     Rash age 55    Physical exam Blood pressure 130/87, pulse (!) 109, temperature 98.7 F (37.1 C), temperature source Tympanic, resp. rate 18, height 6\' 1"  (1.854 m), weight 200 lb 14.4 oz (91.1 kg), SpO2 98 %.   ECOG 0  General appearance: Comfortable appearing without any discomfort Head: Normocephalic without any trauma Oropharynx: Mucous membranes are moist and pink without any thrush or ulcers. Eyes: Pupils are equal and round reactive to light. Lymph nodes: No cervical, supraclavicular, inguinal or axillary lymphadenopathy.   Heart:regular rate and rhythm.  S1 and S2 without leg edema. Lung: Clear without any rhonchi or wheezes.  No dullness to percussion. Abdomin: Soft, nontender, nondistended with good bowel sounds.  No hepatosplenomegaly. Musculoskeletal: No joint deformity or effusion.  Full range of motion noted. Neurological: No deficits noted on motor, sensory and deep tendon reflex exam. Skin: No petechial rash or dryness.  Appeared moist.     Lab Results: Lab Results  Component Value Date   WBC 3.3 (L)  04/23/2020   HGB 15.5 04/23/2020   HCT 45.0 04/23/2020   MCV 87.9 04/23/2020   PLT 132 (L) 04/23/2020     Chemistry      Component Value Date/Time   NA 139 04/23/2020 0750   NA 140 10/09/2016 1428   K 3.9 04/23/2020 0750   K 4.3 10/09/2016 1428   CL 106 04/23/2020 0750   CL 104 03/02/2012 1043   CO2 21 (L) 04/23/2020 0750   CO2 27 10/09/2016 1428   BUN 12 04/23/2020 0750   BUN 10.3 10/09/2016 1428    CREATININE 0.84 04/23/2020 0750   CREATININE 0.8 10/09/2016 1428      Component Value Date/Time   CALCIUM 9.2 04/23/2020 0750   CALCIUM 10.2 10/09/2016 1428   ALKPHOS 49 04/23/2020 0750   ALKPHOS 55 10/09/2016 1428   AST 20 04/23/2020 0750   AST 19 10/09/2016 1428   ALT 17 04/23/2020 0750   ALT 17 10/09/2016 1428   BILITOT 0.5 04/23/2020 0750   BILITOT 0.42 10/09/2016 1428         Impression and Plan:   50 year old man with:  1.   Stage IIB left testicular seminoma after presenting with left retroperitoneal lymph node enlargement measuring 2.8 x 2.8 cm in March 2022.  Initially presented with T2N0 disease in May 2021.  He is currently receiving BEP chemotherapy and completed the first 5 days without any complications.  Risks and benefits of proceeding with bleomycin were discussed at this time.  Complications including fever, pulmonary toxicity among others.  Plan is to complete 3 cycles of therapy.  He is agreeable to proceed at this time.  Laboratory data reviewed and showed adequate hematological parameters.  2.  Right testicular seminoma: He is status post surgical resection including orchiectomy and radiation.   3.    Pulmonary toxicity screening: He is scheduled for repeat pulmonary function test after bleomycin.  4.    IV access: Port-A-Cath inserted without any complications at this time.  5.  Antiemetics: Prescription for Compazine is available to him.  6.  Follow-up: We will be in 1 week To complete cycle 1 of chemotherapy and will start on cycle 2 on May 14, 2020.    30  minutes were dedicated to this visit. The time was spent on reviewing laboratory data, discussing complications related to his cancer and cancer therapy and answering questions regarding future plan.    Zola Button, MD 05/01/2020 8:10 AM

## 2020-05-02 ENCOUNTER — Encounter: Payer: Self-pay | Admitting: Oncology

## 2020-05-02 LAB — AFP TUMOR MARKER: AFP, Serum, Tumor Marker: 5.8 ng/mL (ref 0.0–6.9)

## 2020-05-03 ENCOUNTER — Other Ambulatory Visit: Payer: Self-pay

## 2020-05-03 ENCOUNTER — Ambulatory Visit (HOSPITAL_COMMUNITY)
Admission: RE | Admit: 2020-05-03 | Discharge: 2020-05-03 | Disposition: A | Discharge status: Home / Self Care | Payer: BC Managed Care – PPO | Source: Ambulatory Visit | Attending: Oncology | Admitting: Oncology

## 2020-05-03 DIAGNOSIS — C6292 Malignant neoplasm of left testis, unspecified whether descended or undescended: Secondary | ICD-10-CM | POA: Insufficient documentation

## 2020-05-03 LAB — PULMONARY FUNCTION TEST
DL/VA % pred: 132 %
DL/VA: 5.84 ml/min/mmHg/L
DLCO unc % pred: 140 %
DLCO unc: 43.82 ml/min/mmHg
FEF 25-75 Post: 4.28 L/sec
FEF 25-75 Pre: 4.86 L/sec
FEF2575-%Change-Post: -11 %
FEF2575-%Pred-Post: 116 %
FEF2575-%Pred-Pre: 132 %
FEV1-%Change-Post: -2 %
FEV1-%Pred-Post: 105 %
FEV1-%Pred-Pre: 108 %
FEV1-Post: 4.41 L
FEV1-Pre: 4.54 L
FEV1FVC-%Change-Post: 1 %
FEV1FVC-%Pred-Pre: 105 %
FEV6-%Change-Post: -4 %
FEV6-%Pred-Post: 100 %
FEV6-%Pred-Pre: 105 %
FEV6-Post: 5.27 L
FEV6-Pre: 5.49 L
FEV6FVC-%Change-Post: 0 %
FEV6FVC-%Pred-Post: 103 %
FEV6FVC-%Pred-Pre: 102 %
FVC-%Change-Post: -4 %
FVC-%Pred-Post: 97 %
FVC-%Pred-Pre: 102 %
FVC-Post: 5.27 L
FVC-Pre: 5.53 L
Post FEV1/FVC ratio: 84 %
Post FEV6/FVC ratio: 100 %
Pre FEV1/FVC ratio: 82 %
Pre FEV6/FVC Ratio: 99 %
RV % pred: 71 %
RV: 1.53 L
TLC % pred: 94 %
TLC: 6.97 L

## 2020-05-03 MED ORDER — ALBUTEROL SULFATE (2.5 MG/3ML) 0.083% IN NEBU
2.5000 mg | INHALATION_SOLUTION | Freq: Once | RESPIRATORY_TRACT | Status: AC
  Administered 2020-05-03: 2.5 mg via RESPIRATORY_TRACT

## 2020-05-08 ENCOUNTER — Inpatient Hospital Stay (HOSPITAL_COMMUNITY)
Admission: EM | Admit: 2020-05-08 | Discharge: 2020-05-11 | DRG: 810 | Disposition: A | Discharge status: Home / Self Care | Payer: BC Managed Care – PPO | Attending: Internal Medicine | Admitting: Internal Medicine

## 2020-05-08 ENCOUNTER — Telehealth: Payer: Self-pay | Admitting: *Deleted

## 2020-05-08 ENCOUNTER — Inpatient Hospital Stay: Payer: BC Managed Care – PPO

## 2020-05-08 ENCOUNTER — Encounter: Payer: Self-pay | Admitting: Oncology

## 2020-05-08 ENCOUNTER — Emergency Department (HOSPITAL_COMMUNITY): Payer: BC Managed Care – PPO

## 2020-05-08 ENCOUNTER — Encounter (HOSPITAL_COMMUNITY): Payer: Self-pay

## 2020-05-08 ENCOUNTER — Other Ambulatory Visit: Payer: Self-pay

## 2020-05-08 VITALS — BP 132/91 | HR 88 | Temp 99.5°F | Resp 16

## 2020-05-08 DIAGNOSIS — C6291 Malignant neoplasm of right testis, unspecified whether descended or undescended: Secondary | ICD-10-CM | POA: Diagnosis not present

## 2020-05-08 DIAGNOSIS — E785 Hyperlipidemia, unspecified: Secondary | ICD-10-CM | POA: Diagnosis not present

## 2020-05-08 DIAGNOSIS — R5081 Fever presenting with conditions classified elsewhere: Secondary | ICD-10-CM | POA: Diagnosis present

## 2020-05-08 DIAGNOSIS — T451X5A Adverse effect of antineoplastic and immunosuppressive drugs, initial encounter: Secondary | ICD-10-CM | POA: Diagnosis not present

## 2020-05-08 DIAGNOSIS — Z95828 Presence of other vascular implants and grafts: Secondary | ICD-10-CM

## 2020-05-08 DIAGNOSIS — K5900 Constipation, unspecified: Secondary | ICD-10-CM | POA: Diagnosis present

## 2020-05-08 DIAGNOSIS — D6959 Other secondary thrombocytopenia: Secondary | ICD-10-CM | POA: Diagnosis present

## 2020-05-08 DIAGNOSIS — Z79899 Other long term (current) drug therapy: Secondary | ICD-10-CM

## 2020-05-08 DIAGNOSIS — D709 Neutropenia, unspecified: Secondary | ICD-10-CM | POA: Diagnosis not present

## 2020-05-08 DIAGNOSIS — Z20822 Contact with and (suspected) exposure to covid-19: Secondary | ICD-10-CM | POA: Diagnosis not present

## 2020-05-08 DIAGNOSIS — Z9221 Personal history of antineoplastic chemotherapy: Secondary | ICD-10-CM | POA: Diagnosis not present

## 2020-05-08 DIAGNOSIS — Z880 Allergy status to penicillin: Secondary | ICD-10-CM | POA: Diagnosis not present

## 2020-05-08 DIAGNOSIS — D701 Agranulocytosis secondary to cancer chemotherapy: Principal | ICD-10-CM | POA: Diagnosis present

## 2020-05-08 DIAGNOSIS — Z981 Arthrodesis status: Secondary | ICD-10-CM

## 2020-05-08 DIAGNOSIS — C6290 Malignant neoplasm of unspecified testis, unspecified whether descended or undescended: Secondary | ICD-10-CM | POA: Diagnosis present

## 2020-05-08 DIAGNOSIS — A419 Sepsis, unspecified organism: Secondary | ICD-10-CM | POA: Diagnosis not present

## 2020-05-08 DIAGNOSIS — R519 Headache, unspecified: Secondary | ICD-10-CM | POA: Diagnosis not present

## 2020-05-08 DIAGNOSIS — Z923 Personal history of irradiation: Secondary | ICD-10-CM

## 2020-05-08 DIAGNOSIS — C6292 Malignant neoplasm of left testis, unspecified whether descended or undescended: Secondary | ICD-10-CM

## 2020-05-08 LAB — CBC WITH DIFFERENTIAL/PLATELET
Abs Immature Granulocytes: 0 10*3/uL (ref 0.00–0.07)
Basophils Absolute: 0 10*3/uL (ref 0.0–0.1)
Basophils Relative: 0 %
Eosinophils Absolute: 0 10*3/uL (ref 0.0–0.5)
Eosinophils Relative: 4 %
HCT: 40.2 % (ref 39.0–52.0)
Hemoglobin: 13.8 g/dL (ref 13.0–17.0)
Immature Granulocytes: 0 %
Lymphocytes Relative: 64 %
Lymphs Abs: 0.2 10*3/uL — ABNORMAL LOW (ref 0.7–4.0)
MCH: 30.3 pg (ref 26.0–34.0)
MCHC: 34.3 g/dL (ref 30.0–36.0)
MCV: 88.2 fL (ref 80.0–100.0)
Monocytes Absolute: 0.1 10*3/uL (ref 0.1–1.0)
Monocytes Relative: 21 %
Neutro Abs: 0 10*3/uL — CL (ref 1.7–7.7)
Neutrophils Relative %: 11 %
Platelets: 75 10*3/uL — ABNORMAL LOW (ref 150–400)
RBC: 4.56 MIL/uL (ref 4.22–5.81)
RDW: 12.2 % (ref 11.5–15.5)
WBC: 0.3 10*3/uL — CL (ref 4.0–10.5)
nRBC: 0 % (ref 0.0–0.2)

## 2020-05-08 LAB — COMPREHENSIVE METABOLIC PANEL
ALT: 21 U/L (ref 0–44)
AST: 17 U/L (ref 15–41)
Albumin: 4.2 g/dL (ref 3.5–5.0)
Alkaline Phosphatase: 48 U/L (ref 38–126)
Anion gap: 10 (ref 5–15)
BUN: 16 mg/dL (ref 6–20)
CO2: 24 mmol/L (ref 22–32)
Calcium: 9.1 mg/dL (ref 8.9–10.3)
Chloride: 104 mmol/L (ref 98–111)
Creatinine, Ser: 0.81 mg/dL (ref 0.61–1.24)
GFR, Estimated: >60 mL/min (ref >60)
Glucose, Bld: 111 mg/dL — ABNORMAL HIGH (ref 70–99)
Potassium: 4 mmol/L (ref 3.5–5.1)
Sodium: 138 mmol/L (ref 135–145)
Total Bilirubin: 0.3 mg/dL (ref 0.3–1.2)
Total Protein: 7.5 g/dL (ref 6.5–8.1)

## 2020-05-08 LAB — RESP PANEL BY RT-PCR (FLU A&B, COVID) ARPGX2
Influenza A by PCR: NEGATIVE
Influenza B by PCR: NEGATIVE
SARS Coronavirus 2 by RT PCR: NEGATIVE

## 2020-05-08 LAB — CBC WITH DIFFERENTIAL (CANCER CENTER ONLY)
Abs Immature Granulocytes: 0 10*3/uL (ref 0.00–0.07)
Basophils Absolute: 0 10*3/uL (ref 0.0–0.1)
Basophils Relative: 2 %
Eosinophils Absolute: 0 10*3/uL (ref 0.0–0.5)
Eosinophils Relative: 4 %
HCT: 40.3 % (ref 39.0–52.0)
Hemoglobin: 13.7 g/dL (ref 13.0–17.0)
Immature Granulocytes: 0 %
Lymphocytes Relative: 75 %
Lymphs Abs: 0.4 10*3/uL — ABNORMAL LOW (ref 0.7–4.0)
MCH: 30 pg (ref 26.0–34.0)
MCHC: 34 g/dL (ref 30.0–36.0)
MCV: 88.4 fL (ref 80.0–100.0)
Monocytes Absolute: 0.1 10*3/uL (ref 0.1–1.0)
Monocytes Relative: 14 %
Neutro Abs: 0 10*3/uL — CL (ref 1.7–7.7)
Neutrophils Relative %: 5 %
Platelet Count: 72 10*3/uL — ABNORMAL LOW (ref 150–400)
RBC: 4.56 MIL/uL (ref 4.22–5.81)
RDW: 12.1 % (ref 11.5–15.5)
WBC Count: 0.6 10*3/uL — CL (ref 4.0–10.5)
nRBC: 0 % (ref 0.0–0.2)

## 2020-05-08 LAB — CMP (CANCER CENTER ONLY)
ALT: 17 U/L (ref 0–44)
AST: 12 U/L — ABNORMAL LOW (ref 15–41)
Albumin: 3.7 g/dL (ref 3.5–5.0)
Alkaline Phosphatase: 58 U/L (ref 38–126)
Anion gap: 9 (ref 5–15)
BUN: 11 mg/dL (ref 6–20)
CO2: 25 mmol/L (ref 22–32)
Calcium: 9.2 mg/dL (ref 8.9–10.3)
Chloride: 103 mmol/L (ref 98–111)
Creatinine: 0.79 mg/dL (ref 0.61–1.24)
GFR, Estimated: >60 mL/min (ref >60)
Glucose, Bld: 123 mg/dL — ABNORMAL HIGH (ref 70–99)
Potassium: 4.1 mmol/L (ref 3.5–5.1)
Sodium: 137 mmol/L (ref 135–145)
Total Bilirubin: 0.3 mg/dL (ref 0.3–1.2)
Total Protein: 7.1 g/dL (ref 6.5–8.1)

## 2020-05-08 LAB — URINALYSIS, ROUTINE W REFLEX MICROSCOPIC
Bacteria, UA: NONE SEEN
Bilirubin Urine: NEGATIVE
Glucose, UA: NEGATIVE mg/dL
Ketones, ur: NEGATIVE mg/dL
Leukocytes,Ua: NEGATIVE
Nitrite: NEGATIVE
Protein, ur: NEGATIVE mg/dL
Specific Gravity, Urine: 1.019 (ref 1.005–1.030)
pH: 5 (ref 5.0–8.0)

## 2020-05-08 LAB — HIV ANTIBODY (ROUTINE TESTING W REFLEX): HIV Screen 4th Generation wRfx: NONREACTIVE

## 2020-05-08 LAB — PROTIME-INR
INR: 0.9 (ref 0.8–1.2)
Prothrombin Time: 12.3 seconds (ref 11.4–15.2)

## 2020-05-08 LAB — LACTIC ACID, PLASMA
Lactic Acid, Venous: 1.4 mmol/L (ref 0.5–1.9)
Lactic Acid, Venous: 1.6 mmol/L (ref 0.5–1.9)

## 2020-05-08 LAB — APTT: aPTT: 29 seconds (ref 24–36)

## 2020-05-08 LAB — CK: Total CK: 60 U/L (ref 49–397)

## 2020-05-08 MED ORDER — SODIUM CHLORIDE 0.9 % IV SOLN
2.0000 g | Freq: Three times a day (TID) | INTRAVENOUS | Status: DC
  Filled 2020-05-08: qty 2

## 2020-05-08 MED ORDER — VANCOMYCIN HCL 1500 MG/300ML IV SOLN
1500.0000 mg | Freq: Two times a day (BID) | INTRAVENOUS | Status: DC
  Administered 2020-05-09 – 2020-05-11 (×5): 1500 mg via INTRAVENOUS
  Filled 2020-05-08 (×5): qty 300

## 2020-05-08 MED ORDER — PROCHLORPERAZINE MALEATE 10 MG PO TABS
5.0000 mg | ORAL_TABLET | Freq: Four times a day (QID) | ORAL | Status: DC | PRN
  Administered 2020-05-08: 5 mg via ORAL
  Filled 2020-05-08: qty 1

## 2020-05-08 MED ORDER — PROCHLORPERAZINE MALEATE 10 MG PO TABS
10.0000 mg | ORAL_TABLET | Freq: Once | ORAL | Status: AC
Start: 2020-05-08 — End: 2020-05-08
  Administered 2020-05-08: 10 mg via ORAL

## 2020-05-08 MED ORDER — SODIUM CHLORIDE 0.9% FLUSH
10.0000 mL | INTRAVENOUS | Status: DC | PRN
  Administered 2020-05-08: 10 mL
  Filled 2020-05-08: qty 10

## 2020-05-08 MED ORDER — SODIUM CHLORIDE 0.9% FLUSH
10.0000 mL | Freq: Once | INTRAVENOUS | Status: AC
  Administered 2020-05-08: 10 mL
  Filled 2020-05-08: qty 10

## 2020-05-08 MED ORDER — PANTOPRAZOLE SODIUM 40 MG PO TBEC
40.0000 mg | DELAYED_RELEASE_TABLET | Freq: Every day | ORAL | Status: DC
  Administered 2020-05-08 – 2020-05-09 (×2): 40 mg via ORAL
  Filled 2020-05-08 (×2): qty 1

## 2020-05-08 MED ORDER — CYCLOBENZAPRINE HCL 10 MG PO TABS: 10.0000 mg | ORAL_TABLET | Freq: Three times a day (TID) | ORAL | Status: DC | PRN

## 2020-05-08 MED ORDER — TBO-FILGRASTIM 300 MCG/0.5ML ~~LOC~~ SOSY: 300.0000 ug | PREFILLED_SYRINGE | Freq: Once | SUBCUTANEOUS | Status: DC

## 2020-05-08 MED ORDER — TRAMADOL HCL 50 MG PO TABS
100.0000 mg | ORAL_TABLET | Freq: Two times a day (BID) | ORAL | Status: DC | PRN
  Administered 2020-05-08: 100 mg via ORAL
  Filled 2020-05-08: qty 2

## 2020-05-08 MED ORDER — PROCHLORPERAZINE MALEATE 10 MG PO TABS
ORAL_TABLET | ORAL | Status: AC
  Filled 2020-05-08: qty 1

## 2020-05-08 MED ORDER — LACTATED RINGERS IV BOLUS (SEPSIS)
1000.0000 mL | Freq: Once | INTRAVENOUS | Status: AC
  Administered 2020-05-08: 1000 mL via INTRAVENOUS

## 2020-05-08 MED ORDER — SODIUM CHLORIDE 0.9 % IV SOLN: 2.0000 g | Freq: Three times a day (TID) | INTRAVENOUS | Status: DC

## 2020-05-08 MED ORDER — VANCOMYCIN HCL IN DEXTROSE 1-5 GM/200ML-% IV SOLN
1000.0000 mg | Freq: Once | INTRAVENOUS | Status: AC
  Administered 2020-05-08: 1000 mg via INTRAVENOUS
  Filled 2020-05-08: qty 200

## 2020-05-08 MED ORDER — METRONIDAZOLE IN NACL 5-0.79 MG/ML-% IV SOLN
500.0000 mg | Freq: Once | INTRAVENOUS | Status: AC
  Administered 2020-05-08: 500 mg via INTRAVENOUS
  Filled 2020-05-08: qty 100

## 2020-05-08 MED ORDER — ONDANSETRON HCL 4 MG PO TABS
4.0000 mg | ORAL_TABLET | Freq: Four times a day (QID) | ORAL | Status: DC | PRN
Start: 2020-05-08 — End: 2020-05-11
  Administered 2020-05-08 – 2020-05-09 (×2): 4 mg via ORAL
  Filled 2020-05-08 (×2): qty 1

## 2020-05-08 MED ORDER — POLYETHYLENE GLYCOL 3350 17 G PO PACK
17.0000 g | PACK | Freq: Two times a day (BID) | ORAL | Status: AC
  Administered 2020-05-08 – 2020-05-09 (×2): 17 g via ORAL
  Filled 2020-05-08 (×2): qty 1

## 2020-05-08 MED ORDER — ONDANSETRON HCL 4 MG/2ML IJ SOLN
4.0000 mg | Freq: Four times a day (QID) | INTRAMUSCULAR | Status: DC | PRN
  Administered 2020-05-10 (×3): 4 mg via INTRAVENOUS
  Filled 2020-05-08 (×3): qty 2

## 2020-05-08 MED ORDER — ACETAMINOPHEN 325 MG PO TABS
650.0000 mg | ORAL_TABLET | Freq: Four times a day (QID) | ORAL | Status: DC | PRN
  Administered 2020-05-08 – 2020-05-11 (×8): 650 mg via ORAL
  Filled 2020-05-08 (×8): qty 2

## 2020-05-08 MED ORDER — BLEOMYCIN SULFATE CHEMO INJECTION 30 UNIT
30.0000 [IU] | Freq: Once | INTRAMUSCULAR | Status: AC
Start: 2020-05-08 — End: 2020-05-08
  Administered 2020-05-08: 30 [IU] via INTRAVENOUS
  Filled 2020-05-08: qty 10

## 2020-05-08 MED ORDER — HEPARIN SOD (PORK) LOCK FLUSH 100 UNIT/ML IV SOLN
500.0000 [IU] | Freq: Once | INTRAVENOUS | Status: AC | PRN
Start: 2020-05-08 — End: 2020-05-08
  Administered 2020-05-08: 500 [IU]
  Filled 2020-05-08: qty 5

## 2020-05-08 MED ORDER — LACTATED RINGERS IV SOLN: INTRAVENOUS | Status: AC

## 2020-05-08 MED ORDER — TRAMADOL HCL 50 MG PO TABS
100.0000 mg | ORAL_TABLET | Freq: Four times a day (QID) | ORAL | Status: DC | PRN
  Administered 2020-05-08 – 2020-05-10 (×4): 100 mg via ORAL
  Filled 2020-05-08 (×5): qty 2

## 2020-05-08 MED ORDER — SODIUM CHLORIDE 0.9 % IV SOLN
2.0000 g | Freq: Three times a day (TID) | INTRAVENOUS | Status: DC
  Administered 2020-05-08 – 2020-05-11 (×8): 2 g via INTRAVENOUS
  Filled 2020-05-08 (×9): qty 2

## 2020-05-08 MED ORDER — SODIUM CHLORIDE 0.9 % IV SOLN
2.0000 g | Freq: Once | INTRAVENOUS | Status: AC
  Administered 2020-05-08: 2 g via INTRAVENOUS
  Filled 2020-05-08: qty 2

## 2020-05-08 MED ORDER — ROSUVASTATIN CALCIUM 10 MG PO TABS
10.0000 mg | ORAL_TABLET | Freq: Every day | ORAL | Status: DC
  Administered 2020-05-09 – 2020-05-10 (×2): 10 mg via ORAL
  Filled 2020-05-08 (×2): qty 1

## 2020-05-08 MED ORDER — SODIUM CHLORIDE 0.9 % IV SOLN
Freq: Once | INTRAVENOUS | Status: AC
  Filled 2020-05-08: qty 250

## 2020-05-08 NOTE — Patient Instructions (Signed)

## 2020-05-08 NOTE — Sepsis Progress Note (Signed)
elink is following this code sepsis 

## 2020-05-08 NOTE — Progress Notes (Signed)
Per Dr. Alen Blew okay to proceed with D16C1 with WBC, ANC, and Platelets counts today/

## 2020-05-08 NOTE — Telephone Encounter (Signed)
Patient called & left message stating he had infusion this morning, is now having chills, teeth are chattering.  Dr. Alen Blew informed, recommends that patient go to ED.  PC to patient, advised him of Dr. Hazeline Junker recommendation, patient verbalizes understanding, is going to ED, wife is coming home to drive him.

## 2020-05-08 NOTE — Progress Notes (Signed)
CRITICAL VALUE STICKER  CRITICAL VALUE: WBC 0.6, ANC 0.0  RECEIVER (on-site recipient of call): Velna Ochs RN  DATE & TIME NOTIFIED: 05/08/20 @ [phone]  MESSENGER (representative from lab): Ulice Dash  MD NOTIFIED: Dr. Alen Blew,   TIME OF NOTIFICATION:0910  RESPONSE: No new orders

## 2020-05-08 NOTE — Progress Notes (Addendum)
Pharmacy Antibiotic Note  Kevin Hess is a 50 y.o. male receiving chemotherapy (BEP) for testicular cancer, admitted on 05/08/2020 with sepsis and profound neutropenia. EDP initiated empiric aztreonam and vancomycin and requested pharmacy dosing assistance.  Distant history of PCN allergy (rash); no prior documentation of cephalosporin administrations in [facility] record.  Plan: Aztreonam 2 grams IV stat in ED and q8h Vancomycin 1 gram IV x1 in ED, repeat 1 gram at 6 pm for 2 gram total loading dose, then 1500 mg IV q12h  Goal AUC 400-550.  Expected AUC: 446  SCr used: 0.8 Consider de-escalation to empiric cefepime monotherapy when stable Follow culture results, renal function, clinical course.   Height: 6' (182.9 cm) Weight: 88.9 kg (196 lb) IBW/kg (Calculated) : 77.6  Temp (24hrs), Avg:101.9 F (38.8 C), Min:99.5 F (37.5 C), Max:103.1 F (39.5 C)  Recent Labs  Lab 05/08/20 0844  WBC 0.6*  CREATININE 0.79   ANC=0    Estimated Creatinine Clearance: 121.3 mL/min (by C-G formula based on SCr of 0.79 mg/dL).    Allergies  Allergen Reactions  . Penicillins     Rash age 69    Antimicrobials this admission: 4/26 aztreonam >> 4/26 vancomycin >> 4/26 metronidazole x 1  Dose adjustments this admission:   Microbiology results: 4/26 BCx: ordered 4/26 UCx: ordered  Thank you for allowing pharmacy to be a part of this patient's care.  Clayburn Pert, PharmD, BCPS 05/08/2020  2:11 PM

## 2020-05-08 NOTE — Patient Instructions (Signed)
[facility] Discharge Instructions for Patients Receiving Chemotherapy  Today you received the following chemotherapy agents; bleomycin.  To help prevent nausea and vomiting after your treatment, we encourage you to take your nausea medication as directed.   If you develop nausea and vomiting that is not controlled by your nausea medication, call the clinic.   BELOW ARE SYMPTOMS THAT SHOULD BE REPORTED IMMEDIATELY:  *FEVER GREATER THAN 100.5 F  *CHILLS WITH OR WITHOUT FEVER  NAUSEA AND VOMITING THAT IS NOT CONTROLLED WITH YOUR NAUSEA MEDICATION  *UNUSUAL SHORTNESS OF BREATH  *UNUSUAL BRUISING OR BLEEDING  TENDERNESS IN MOUTH AND THROAT WITH OR WITHOUT PRESENCE OF ULCERS  *URINARY PROBLEMS  *BOWEL PROBLEMS  UNUSUAL RASH Items with * indicate a potential emergency and should be followed up as soon as possible.  Feel free to call the clinic should you have any questions or concerns. The clinic phone number is [phone].  Please show the CHEMO ALERT CARD at check-in to the Emergency Department and triage nurse.   

## 2020-05-08 NOTE — Plan of Care (Signed)

## 2020-05-08 NOTE — ED Triage Notes (Signed)
Pt came from home via POV. Pt had an appt for labs and infusion at cancer center this AM. Felt fine until 1150; pt's teeth began chattering, full body chills, headache all AM, LLQ abdominal pain. Last BM at 0630, normal BM.  Neutropenic labs this AM at cancer center. Pt denies fever with past chemo tx.

## 2020-05-08 NOTE — ED Provider Notes (Signed)
Kevin Hess Provider Note   CSN: [id number] Arrival date & time: 05/08/20  1306     History Chief Complaint  Patient presents with  . Fever    Kevin Hess is a 50 y.o. male.  HPI Patient presents same day as receiving bleomycin infusion for seminoma now with concern for fever, chills, unsettled sensation. Patient has history of seminoma.  His first episode with this was 12 years ago.  He was recently diagnosed with a recurrence.  Patient received bleomycin infusion today in the course of anticipated additional 4 sessions of this medication.  He notes that after his first 3 infusions he had no similar reaction.  However, today, after returning home, the patient felt unwell.  By the time of his ED arrival, after he spoke with his oncologist, he was feeling somewhat better.  Patient denies dyspnea, rash, vomiting, abdominal or any other focal pain.    Past Medical History:  Diagnosis Date  . Allergy   . Chronic kidney disease 2020   kidney stones   . Complication of anesthesia   . History of blood transfusion 1986  . History of kidney stones   . History of radiation therapy 2009   right testicular cancer  . Hyperlipidemia   . Malignant neoplasm of other and unspecified testis 2009   testicular right 2009, left 2021   . Mononucleosis   . PONV (postoperative nausea and vomiting)   . Scoliosis     Patient Active Problem List   Diagnosis Date Noted  . Port-A-Cath in place 05/01/2020  . Testis cancer ([place]) 04/05/2020  . Malignant neoplasm of left testis ([place]) 04/05/2020  . Ascending aortic aneurysm ([place]) 06/21/2019  . Chronic pelvic pain in male 09/22/2012  . History of testicular cancer 09/22/2012  . Kidney stone 09/22/2012  . CHEST PAIN, ACUTE 09/02/2008    Past Surgical History:  Procedure Laterality Date  . [place]   due to scoliosis 1986 Thoracic fusion 1997 thoracici hardware removal  . EXTRACORPOREAL SHOCK  WAVE LITHOTRIPSY  04/2018  . IR IMAGING GUIDED PORT INSERTION  04/13/2020  . left vastectomy  2011   with local  . ORCHIECTOMY Left 05/31/2019   Procedure: ORCHIECTOMY, RADICAL;  Surgeon: Ceasar Mons, MD;  Location: [facility];  Service: Urology;  Laterality: Left;  . right radical ocrhiectomy  2009  . UPPER GASTROINTESTINAL ENDOSCOPY    . UPPER GI ENDOSCOPY  2010  . WISDOM TOOTH EXTRACTION     x 1 with Versed        Family History  Problem Relation Age of Onset  . Colitis Mother   . Diverticulitis Mother   . Colitis Brother   . Diverticulitis Brother   . Colon polyps Brother   . Esophageal cancer Neg Hx   . Stomach cancer Neg Hx   . Rectal cancer Neg Hx   . Colon cancer Neg Hx     Social History   Tobacco Use  . Smoking status: Never Smoker  . Smokeless tobacco: Never Used  Vaping Use  . Vaping Use: Never used  Substance Use Topics  . Alcohol use: Never  . Drug use: Never    Home Medications Prior to Admission medications   Medication Sig Start Date End Date Taking? Authorizing Provider  acetaminophen (TYLENOL) 500 MG tablet Take 1,000 mg by mouth every 6 (six) hours as needed for moderate pain.   Yes [provider]  Bacillus Coagulans-Inulin (ALIGN  PREBIOTIC-PROBIOTIC PO) Take 1 tablet by mouth daily.   Yes [provider]  Cholecalciferol (VITAMIN D3) 5000 UNITS CAPS Take 5,000 Units by mouth daily. Takes Monday thru friday   Yes [provider]  cyclobenzaprine (FLEXERIL) 10 MG tablet Take 10 mg by mouth 3 (three) times daily as needed for muscle spasms. As needed   Yes [provider]  docusate sodium (COLACE) 100 MG capsule Take 100 mg by mouth 2 (two) times daily.   Yes [provider]  esomeprazole (NEXIUM) 20 MG capsule Take 20 mg by mouth at bedtime.   Yes [provider]  fluticasone (FLONASE) 50 MCG/ACT nasal spray Place 2 sprays into both nostrils daily.   Yes [provider]  lidocaine-prilocaine (EMLA) cream Apply 1 application topically as needed. Patient taking differently: Apply 1 application topically once as needed (port access). 04/25/20  Yes Wyatt Portela, MD  prochlorperazine (COMPAZINE) 10 MG tablet Take 1 tablet (10 mg total) by mouth every 6 (six) hours as needed for nausea or vomiting. 04/16/20  Yes Wyatt Portela, MD  rosuvastatin (CRESTOR) 10 MG tablet Take 10 mg by mouth daily.   Yes [provider]  testosterone cypionate (DEPOTESTOSTERONE CYPIONATE) 200 MG/ML injection Inject 80 mg into the muscle See admin instructions. Every 9 days 09/30/19  Yes [provider]    Allergies    Penicillins  Review of Systems   Review of Systems  Constitutional:       Per HPI, otherwise negative  HENT:       Per HPI, otherwise negative  Respiratory:       Per HPI, otherwise negative  Cardiovascular:       Per HPI, otherwise negative  Gastrointestinal: Negative for vomiting.  Endocrine:       Negative aside from HPI  Genitourinary:       Neg aside from HPI   Musculoskeletal:       Per HPI, otherwise negative  Skin: Negative.   Allergic/Immunologic: Positive for immunocompromised state.  Neurological: Positive for weakness. Negative for syncope.    Physical Exam Updated Vital Signs BP 119/78   Pulse (!) 124   Temp (!) 103 F (39.4 C) (Oral)   Resp 16   Ht 6' (1.829 m)   Wt 88.9 kg   SpO2 96%   BMI 26.58 kg/m   Physical Exam Vitals and nursing note reviewed.  Constitutional:      General: He is not in acute distress.    Appearance: He is well-developed.  HENT:     Head: Normocephalic and atraumatic.  Eyes:     Conjunctiva/sclera: Conjunctivae normal.  Cardiovascular:     Rate and Rhythm: Regular rhythm. Tachycardia present.  Pulmonary:     Effort: Pulmonary effort is normal. Tachypnea present. No respiratory distress.     Breath sounds: No stridor.  Abdominal:     General: There is no distension.   Skin:    General: Skin is warm and dry.     Findings: No rash.  Neurological:     Mental Status: He is alert and oriented to person, place, and time.     ED Results / Procedures / Treatments   Labs (all labs ordered are listed, but only abnormal results are displayed) Labs Reviewed  URINALYSIS, ROUTINE W REFLEX MICROSCOPIC - Abnormal; Notable for the following components:      Result Value   Hgb urine dipstick MODERATE (*)    All other components within normal limits  RESP PANEL BY RT-PCR (FLU A&B, COVID) ARPGX2  CULTURE, BLOOD (ROUTINE X 2)  CULTURE, BLOOD (ROUTINE X 2)  URINE CULTURE  LACTIC ACID, PLASMA  PROTIME-INR  APTT  LACTIC ACID, PLASMA  COMPREHENSIVE METABOLIC PANEL  CBC WITH DIFFERENTIAL/PLATELET    EKG None  Radiology DG Chest Port 1 View  Result Date: 05/08/2020 CLINICAL DATA:  Questionable sepsis, evaluate for abnormality. EXAM: PORTABLE CHEST 1 VIEW COMPARISON:  September 20, 2013. FINDINGS: Cardiac silhouette is accentuated by AP technique and low lung volumes. Both lungs are clear. No visible pleural effusions or pneumothorax. No acute osseous abnormality. Partially imaged thoracic spinal fusion hardware. Right IJ approach Port-A-Cath with the tip poorly visualized due to spinal fusion hardware, but likely at the SVC. IMPRESSION: No evidence of acute cardiopulmonary disease. Electronically Signed   By: Margaretha Sheffield MD   On: 05/08/2020 14:44    Procedures Procedures   Medications Ordered in ED Medications  lactated ringers infusion ( Intravenous New Bag/Given 05/08/20 1451)  aztreonam (AZACTAM) 2 g in sodium chloride 0.9 % 100 mL IVPB (has no administration in time range)  metroNIDAZOLE (FLAGYL) IVPB 500 mg (500 mg Intravenous New Bag/Given 05/08/20 1454)  vancomycin (VANCOCIN) IVPB 1000 mg/200 mL premix (has no administration in time range)  lactated ringers bolus 1,000 mL (1,000 mLs Intravenous New Bag/Given 05/08/20 1450)  aztreonam (AZACTAM) 2 g  in sodium chloride 0.9 % 100 mL IVPB (has no administration in time range)  vancomycin (VANCOCIN) IVPB 1000 mg/200 mL premix (has no administration in time range)  vancomycin (VANCOREADY) IVPB 1500 mg/300 mL (has no administration in time range)    ED Course  I have reviewed the triage vital signs and the nursing notes.  Pertinent labs & imaging results that were available during my care of the patient were reviewed by me and considered in my medical decision making (see chart for details).   On arrival, with consideration of neutropenic fever, and the patient meeting SIRS criteria he was placed on continuous cardiac monitoring, pulse oximetry. Cardiac 130, sinus tach, abnormal Pulse oximetry 99% room air normal I discussed the patient's case with his oncologist soon after initial assessment, with consideration of neutropenic fever versus bleomycin reaction the patient received broad-spectrum antibiotics.  4:13 PM Patient accompanied by his wife.  We again discussed findings thus far, notable for neutropenia as demonstrated on labs drawn earlier today in clinic, available via EMR. Patient's heart rate has diminished, now in the low 100 range.  He continues to have no hypotension. X-ray reassuring MDM Rules/Calculators/A&P MDM Number of Diagnoses or Management Options Neutropenic fever ([place]): new, needed workup   Amount and/or Complexity of Data Reviewed Clinical lab tests: ordered and reviewed Tests in the radiology section of CPT: ordered and reviewed Tests in the medicine section of CPT: reviewed and ordered Decide to obtain previous medical records or to obtain history from someone other than the patient: yes Obtain history from someone other than the patient: yes Review and summarize past medical records: yes Discuss the patient with other providers: yes Independent visualization of images, tracings, or specimens: yes  Risk of Complications, Morbidity, and/or  Mortality Presenting problems: high Diagnostic procedures: high Management options: high  Critical Care Total time providing critical care: 30-74 minutes (45)  Patient Progress Patient progress: stable   Final Clinical Impression(s) / ED Diagnoses Final diagnoses:  Neutropenic fever ([place])     Carmin Muskrat, MD 05/08/20 1615

## 2020-05-08 NOTE — H&P (Signed)
History and Physical    Kevin Hess UXL:[id number] DOB: [birth year] DOA: 05/08/2020  PCP: Sueanne Margarita, DO  Patient coming from: Home    Chief Complaint: fever and chills  HPI: Kevin Hess is a 50 y.o. male with medical history significant of testicular cancer in 2009 status post orchiectomy then again in May 2021 status post orchiectomy status post adjuvant radiation therapy now on BEP was completed his first cycle his last treatment was on 05/08/2020 went home and started having fever and chills check his temperature at home and it was 99 took some Tylenol but it did not improve he felt so bad he had to come back to the ED.  He denies any fever, cough, shortness of breath, nausea vomiting.  He ate late some fullness in the left lower quadrant.  ED Course:  Found to be febrile with a temperature of 103, mildly tachycardic, white blood cell count of 0.3, with an ANC of 0.  And influenza PCR were negative.  Chest x-ray showed no evidence of cardiopulmonary disease.  Review of Systems: As per HPI otherwise all other systems reviewed and are negative.   Past Medical History:  Diagnosis Date  . Allergy   . Chronic kidney disease 2020   kidney stones   . Complication of anesthesia   . History of blood transfusion 1986  . History of kidney stones   . History of radiation therapy 2009   right testicular cancer  . Hyperlipidemia   . Malignant neoplasm of other and unspecified testis 2009   testicular right 2009, left 2021   . Mononucleosis   . PONV (postoperative nausea and vomiting)   . Scoliosis     Past Surgical History:  Procedure Laterality Date  . [place]   due to scoliosis 1986 Thoracic fusion 1997 thoracici hardware removal  . EXTRACORPOREAL SHOCK WAVE LITHOTRIPSY  04/2018  . IR IMAGING GUIDED PORT INSERTION  04/13/2020  . left vastectomy  2011   with local  . ORCHIECTOMY Left 05/31/2019   Procedure: ORCHIECTOMY, RADICAL;  Surgeon: Ceasar Mons, MD;  [facility]: [facility];  Service: Urology;  Laterality: Left;  . right radical ocrhiectomy  2009  . UPPER GASTROINTESTINAL ENDOSCOPY    . UPPER GI ENDOSCOPY  2010  . WISDOM TOOTH EXTRACTION     x 1 with Versed     Social History  reports that he has never smoked. He has never used smokeless tobacco. He reports that he does not drink alcohol and does not use drugs.  Allergies  Allergen Reactions  . Penicillins     Rash age 19    Family History  Problem Relation Age of Onset  . Colitis Mother   . Diverticulitis Mother   . Hypertension Mother   . Colitis Brother   . Diverticulitis Brother   . Colon polyps Brother   . CAD Father   . Hypertension Father   . Esophageal cancer Neg Hx   . Stomach cancer Neg Hx   . Rectal cancer Neg Hx   . Colon cancer Neg Hx     Prior to Admission medications   Medication Sig Start Date End Date Taking? Authorizing Provider  acetaminophen (TYLENOL) 500 MG tablet Take 1,000 mg by mouth every 6 (six) hours as needed for moderate pain.   Yes [provider]  Bacillus Coagulans-Inulin (ALIGN PREBIOTIC-PROBIOTIC PO) Take 1 tablet by mouth daily.   Yes [provider]  Cholecalciferol (VITAMIN D3)  5000 UNITS CAPS Take 5,000 Units by mouth daily. Takes Monday thru friday   Yes [provider]  cyclobenzaprine (FLEXERIL) 10 MG tablet Take 10 mg by mouth 3 (three) times daily as needed for muscle spasms. As needed   Yes [provider]  docusate sodium (COLACE) 100 MG capsule Take 100 mg by mouth 2 (two) times daily.   Yes [provider]  esomeprazole (NEXIUM) 20 MG capsule Take 20 mg by mouth at bedtime.   Yes [provider]  fluticasone (FLONASE) 50 MCG/ACT nasal spray Place 2 sprays into both nostrils daily.   Yes [provider]  lidocaine-prilocaine (EMLA) cream Apply 1 application topically as needed. Patient taking differently: Apply 1 application topically  once as needed (port access). 04/25/20  Yes Wyatt Portela, MD  prochlorperazine (COMPAZINE) 10 MG tablet Take 1 tablet (10 mg total) by mouth every 6 (six) hours as needed for nausea or vomiting. 04/16/20  Yes Wyatt Portela, MD  rosuvastatin (CRESTOR) 10 MG tablet Take 10 mg by mouth daily.   Yes [provider]  testosterone cypionate (DEPOTESTOSTERONE CYPIONATE) 200 MG/ML injection Inject 80 mg into the muscle See admin instructions. Every 9 days 09/30/19  Yes [provider]    Physical Exam: Vitals:   05/08/20 1430 05/08/20 1500 05/08/20 1530 05/08/20 1600  BP: 119/78 122/73 125/83 132/76  Pulse: (!) 124 (!) 117 (!) 116 (!) 118  Resp: 16 17 (!) 21 19  Temp:      TempSrc:      SpO2: 96% 96% 97% 96%  Weight:      Height:        Constitutional: NAD, calm, comfortable Vitals:   05/08/20 1430 05/08/20 1500 05/08/20 1530 05/08/20 1600  BP: 119/78 122/73 125/83 132/76  Pulse: (!) 124 (!) 117 (!) 116 (!) 118  Resp: 16 17 (!) 21 19  Temp:      TempSrc:      SpO2: 96% 96% 97% 96%  Weight:      Height:       Eyes: PERRL, lids and conjunctivae normal ENMT: Mucous membranes are moist. Posterior pharynx clear of any exudate or lesions.Normal dentition.  Neck: normal, supple, no masses, no thyromegaly Respiratory: Good air movement and clear to auscultation. Cardiovascular: Regular rate and rhythm, no murmurs / rubs / gallops. No extremity edema. 2+ pedal pulses. No carotid bruits.  Abdomen: no tenderness, no masses palpated. No hepatosplenomegaly. Bowel sounds positive.  Musculoskeletal: no clubbing / cyanosis. No joint deformity upper and lower extremities. Good ROM, no contractures. Normal muscle tone.  Skin: Some allergic area rashes in his left lower extremity none on the back  Psychiatric: Normal judgment and insight. Alert and oriented x 3. Normal mood.    Labs on Admission: I have personally reviewed following labs and imaging studies  CBC: Recent Labs   Lab 05/08/20 0844 05/08/20 1414  WBC 0.6* 0.3*  NEUTROABS 0.0* 0.0*  HGB 13.7 13.8  HCT 40.3 40.2  MCV 88.4 88.2  PLT 72* 75*    Basic Metabolic Panel: Recent Labs  Lab 05/08/20 0844  NA 137  K 4.1  CL 103  CO2 25  GLUCOSE 123*  BUN 11  CREATININE 0.79  CALCIUM 9.2    GFR: Estimated Creatinine Clearance: 121.3 mL/min (by C-G formula based on SCr of 0.79 mg/dL).  Liver Function Tests: Recent Labs  Lab 05/08/20 0844  AST 12*  ALT 17  ALKPHOS 58  BILITOT 0.3  PROT 7.1  ALBUMIN 3.7    Urine analysis:    Component Value Date/Time   COLORURINE YELLOW 05/08/2020 1414   APPEARANCEUR CLEAR 05/08/2020 1414   LABSPEC 1.019 05/08/2020 1414   PHURINE 5.0 05/08/2020 1414   GLUCOSEU NEGATIVE 05/08/2020 1414   HGBUR MODERATE (A) 05/08/2020 1414   BILIRUBINUR NEGATIVE 05/08/2020 1414   KETONESUR NEGATIVE 05/08/2020 1414   PROTEINUR NEGATIVE 05/08/2020 1414   NITRITE NEGATIVE 05/08/2020 1414   LEUKOCYTESUR NEGATIVE 05/08/2020 1414    Radiological Exams on Admission: DG Chest Port 1 View  Result Date: 05/08/2020 CLINICAL DATA:  Questionable sepsis, evaluate for abnormality. EXAM: PORTABLE CHEST 1 VIEW COMPARISON:  September 20, 2013. FINDINGS: Cardiac silhouette is accentuated by AP technique and low lung volumes. Both lungs are clear. No visible pleural effusions or pneumothorax. No acute osseous abnormality. Partially imaged thoracic spinal fusion hardware. Right IJ approach Port-A-Cath with the tip poorly visualized due to spinal fusion hardware, but likely at the SVC. IMPRESSION: No evidence of acute cardiopulmonary disease. Electronically Signed   By: Margaretha Sheffield MD   On: 05/08/2020 14:44    EKG: Independently reviewed.  None  Assessment/Plan Active Problems:   Neutropenic fever ([place]) Admit him to MedSurg start empirically on IV antibiotics, vancomycin and cefepime.  He developed a rash to penicillin when he was 50 years old, there is less than 5%  cross-reactivity between penicillins and cephalosporins.  Is okay to go ahead and start cefepime. Check blood cultures x2. Continue IV fluid hydration for the next 24 hours. His urine showing moderate hemoglobin but 0-5 red blood cells check a CK.  Constipation: Will start on MiraLAX p.o. twice daily   DVT prophylaxis: lovenox Code Status:   Full Family Communication:  Spouse  Consults called:  none  Admission status:  Inpatient  Severity of Illness: The appropriate patient status for this patient is INPATIENT. Inpatient status is judged to be reasonable and necessary in order to provide the required intensity of service to ensure the patient's safety. The patient's presenting symptoms, physical exam findings, and initial radiographic and laboratory data in the context of their chronic comorbidities is felt to place them at high risk for further clinical deterioration. Furthermore, it is not anticipated that the patient will be medically stable for discharge from the hospital within 2 midnights of admission. The following factors support the patient status of inpatient.   50 year old with neutropenia who comes in for fever, ulcer empirically on IV Vanco and cefepime and IV fluids.  Will need inpatient admission for evaluation and work-up was fever check blood culture chest x-ray.   * I certify that at the point of admission it is my clinical judgment that the patient will require inpatient hospital care spanning beyond 2 midnights from the point of admission due to high intensity of service, high risk for further deterioration and high frequency of surveillance required.Charlynne Cousins MD [facility]  How to contact the [facility] Attending or Consulting provider [place] or covering provider during after hours [place], for this patient?   1. Check the care team in [facility] and look for a) attending/consulting [facility] provider listed and b) the [facility] team listed 2. Log into [email or web address]  and use [place]'s universal password to access. If you do not have the password, please contact the hospital operator. 3. Locate the [facility] provider you are looking for under [facility] and page to a number that you can be directly reached. 4. If you  still have difficulty reaching the provider, please page the [facility] (Director on Call) for the Hospitalists listed on amion for assistance.  05/08/2020, 4:25 PM

## 2020-05-09 DIAGNOSIS — D701 Agranulocytosis secondary to cancer chemotherapy: Secondary | ICD-10-CM | POA: Diagnosis not present

## 2020-05-09 DIAGNOSIS — T451X5A Adverse effect of antineoplastic and immunosuppressive drugs, initial encounter: Secondary | ICD-10-CM | POA: Diagnosis not present

## 2020-05-09 DIAGNOSIS — C6291 Malignant neoplasm of right testis, unspecified whether descended or undescended: Secondary | ICD-10-CM | POA: Diagnosis not present

## 2020-05-09 DIAGNOSIS — Z9221 Personal history of antineoplastic chemotherapy: Secondary | ICD-10-CM

## 2020-05-09 DIAGNOSIS — D6959 Other secondary thrombocytopenia: Secondary | ICD-10-CM

## 2020-05-09 LAB — CBC
HCT: 34.8 % — ABNORMAL LOW (ref 39.0–52.0)
Hemoglobin: 11.9 g/dL — ABNORMAL LOW (ref 13.0–17.0)
MCH: 30.6 pg (ref 26.0–34.0)
MCHC: 34.2 g/dL (ref 30.0–36.0)
MCV: 89.5 fL (ref 80.0–100.0)
Platelets: 77 10*3/uL — ABNORMAL LOW (ref 150–400)
RBC: 3.89 MIL/uL — ABNORMAL LOW (ref 4.22–5.81)
RDW: 12.3 % (ref 11.5–15.5)
WBC: 0.7 10*3/uL — CL (ref 4.0–10.5)
nRBC: 0 % (ref 0.0–0.2)

## 2020-05-09 MED ORDER — CHLORHEXIDINE GLUCONATE CLOTH 2 % EX PADS
6.0000 | MEDICATED_PAD | Freq: Every day | CUTANEOUS | Status: DC
  Administered 2020-05-09 – 2020-05-11 (×3): 6 via TOPICAL

## 2020-05-09 MED ORDER — POLYETHYLENE GLYCOL 3350 17 G PO PACK
17.0000 g | PACK | Freq: Two times a day (BID) | ORAL | Status: AC
  Administered 2020-05-09 (×2): 17 g via ORAL
  Filled 2020-05-09 (×2): qty 1

## 2020-05-09 MED ORDER — TBO-FILGRASTIM 300 MCG/0.5ML ~~LOC~~ SOSY
300.0000 ug | PREFILLED_SYRINGE | Freq: Every day | SUBCUTANEOUS | Status: DC
  Administered 2020-05-09 – 2020-05-10 (×2): 300 ug via SUBCUTANEOUS
  Filled 2020-05-09 (×2): qty 0.5

## 2020-05-09 NOTE — Progress Notes (Signed)
[facility] PROGRESS NOTE    Progress Note  Dujuan Stankowski  HCW:[id number] DOB: 1970-03-17 DOA: 05/08/2020 PCP: Sueanne Margarita, DO     Brief Narrative:   Maude Gloor is an 50 y.o. male with medical history significant of testicular cancer in 2009 status post orchiectomy then again in May 2021 status post orchiectomy status post adjuvant radiation therapy now on BEP was completed his first cycle his last treatment was on 05/08/2020 went home and started having fever and chills check his temperature at home and it was 99 took some Tylenol but it did not improve he felt so bad he had to come back to the ED.      Assessment/Plan:   Active Problems:   Neutropenic fever ([place]) Started empirically on IV vancomycin and cefepime has tolerated it well. Has remained afebrile since admission. Start Granix, cultures have remained negative till date.  Constipation: Continue MiraLAX.   DVT prophylaxis: lovenox Family Communication:none Status is: Inpatient  Remains inpatient appropriate because:Hemodynamically unstable   Dispo: The patient is from: Home              Anticipated d/c is to: Home              Patient currently is not medically stable to d/c.   Difficult to place patient No        Code Status:     Code Status Orders  (From admission, onward)         Start     Ordered   05/08/20 1706  Full code  Continuous        05/08/20 1705        Code Status History    This patient has a current code status but no historical code status.   Advance Care Planning Activity    Advance Directive Documentation   Flowsheet Row Most Recent Value  Type of Advance Directive Healthcare Power of Attorney, Living will  Pre-existing out of facility DNR order (yellow form or pink MOST form) --  "MOST" Form in Place? --        IV Access:    Peripheral IV   Procedures and diagnostic studies:   DG Chest Port 1 View  Result Date: 05/08/2020 CLINICAL DATA:   Questionable sepsis, evaluate for abnormality. EXAM: PORTABLE CHEST 1 VIEW COMPARISON:  September 20, 2013. FINDINGS: Cardiac silhouette is accentuated by AP technique and low lung volumes. Both lungs are clear. No visible pleural effusions or pneumothorax. No acute osseous abnormality. Partially imaged thoracic spinal fusion hardware. Right IJ approach Port-A-Cath with the tip poorly visualized due to spinal fusion hardware, but likely at the SVC. IMPRESSION: No evidence of acute cardiopulmonary disease. Electronically Signed   By: Margaretha Sheffield MD   On: 05/08/2020 14:44     Medical Consultants:    None.   Subjective:    Teodora Medici tolerating his diet he feels great has not had a bowel movement.  Objective:    Vitals:   05/08/20 1714 05/08/20 2137 05/09/20 0112 05/09/20 0522  BP: (!) 134/100 123/78 115/78 119/81  Pulse: (!) 112 75 81 61  Resp: 18 16 16 14   Temp: (!) 101.4 F (38.6 C) 98.6 F (37 C) 98.5 F (36.9 C) 97.8 F (36.6 C)  TempSrc: Oral Oral Oral Oral  SpO2: 99% 100% 97% 98%  Weight:      Height:       SpO2: 98 %   Intake/Output Summary (Last 24 hours) at 05/09/2020 1123 Last  data filed at 05/09/2020 1044 Gross per 24 hour  Intake 2985.71 ml  Output 700 ml  Net 2285.71 ml   Filed Weights   05/08/20 1321 05/08/20 1339  Weight: 88.9 kg 88.9 kg    Exam: General exam: In no acute distress. Respiratory system: Good air movement and clear to auscultation. Cardiovascular system: S1 & S2 heard, RRR. No JVD, murmurs, rubs, gallops or clicks.  Gastrointestinal system: Abdomen is nondistended, soft and nontender.  Extremities: No pedal edema. Skin: No rashes, lesions or ulcers Psychiatry: Judgement and insight appear normal. Mood & affect appropriate.    Data Reviewed:    Labs: Basic Metabolic Panel: Recent Labs  Lab 05/08/20 0844 05/08/20 1414  NA 137 138  K 4.1 4.0  CL 103 104  CO2 25 24  GLUCOSE 123* 111*  BUN 11 16  CREATININE 0.79 0.81   CALCIUM 9.2 9.1   GFR Estimated Creatinine Clearance: 119.8 mL/min (by C-G formula based on SCr of 0.81 mg/dL). Liver Function Tests: Recent Labs  Lab 05/08/20 0844 05/08/20 1414  AST 12* 17  ALT 17 21  ALKPHOS 58 48  BILITOT 0.3 0.3  PROT 7.1 7.5  ALBUMIN 3.7 4.2   No results for input(s): LIPASE, AMYLASE in the last 168 hours. No results for input(s): AMMONIA in the last 168 hours. Coagulation profile Recent Labs  Lab 05/08/20 1414  INR 0.9   COVID-19 Labs  No results for input(s): DDIMER, FERRITIN, LDH, CRP in the last 72 hours.  Lab Results  Component Value Date   SARSCOV2NAA NEGATIVE 05/08/2020   [place] NEGATIVE 04/30/2020   [place] NEGATIVE 05/27/2019    CBC: Recent Labs  Lab 05/08/20 0844 05/08/20 1414 05/09/20 0529  WBC 0.6* 0.3* 0.7*  NEUTROABS 0.0* 0.0*  --   HGB 13.7 13.8 11.9*  HCT 40.3 40.2 34.8*  MCV 88.4 88.2 89.5  PLT 72* 75* 77*   Cardiac Enzymes: Recent Labs  Lab 05/08/20 1414  CKTOTAL 60   BNP (last 3 results) No results for input(s): PROBNP in the last 8760 hours. CBG: No results for input(s): GLUCAP in the last 168 hours. D-Dimer: No results for input(s): DDIMER in the last 72 hours. Hgb A1c: No results for input(s): HGBA1C in the last 72 hours. Lipid Profile: No results for input(s): CHOL, HDL, LDLCALC, TRIG, CHOLHDL, LDLDIRECT in the last 72 hours. Thyroid function studies: No results for input(s): TSH, T4TOTAL, T3FREE, THYROIDAB in the last 72 hours.  Invalid input(s): FREET3 Anemia work up: No results for input(s): VITAMINB12, FOLATE, FERRITIN, TIBC, IRON, RETICCTPCT in the last 72 hours. Sepsis Labs: Recent Labs  Lab 05/08/20 0844 05/08/20 1414 05/08/20 1730 05/09/20 0529  WBC 0.6* 0.3*  --  0.7*  LATICACIDVEN  --  1.4 1.6  --    Microbiology Recent Results (from the past 240 hour(s))  SARS CORONAVIRUS 2 (TAT 6-24 HRS) Nasopharyngeal Nasopharyngeal Swab     Status: None   Collection Time: 04/30/20   3:05 PM   Specimen: Nasopharyngeal Swab  Result Value Ref Range Status   SARS Coronavirus 2 NEGATIVE NEGATIVE Final    Comment: (NOTE) SARS-CoV-2 target nucleic acids are NOT DETECTED.  The SARS-CoV-2 RNA is generally detectable in upper and lower respiratory specimens during the acute phase of infection. Negative results do not preclude SARS-CoV-2 infection, do not rule out co-infections with other pathogens, and should not be used as the sole basis for treatment or other patient management decisions. Negative results must be combined with clinical observations, patient  history, and epidemiological information. The expected result is Negative.  Fact Sheet for Patients: SugarRoll.be  Fact Sheet for Healthcare Providers: https://www.woods-mathews.com/  This test is not yet approved or cleared by the [place] FDA and  has been authorized for detection and/or diagnosis of SARS-CoV-2 by FDA under an Emergency Use Authorization (EUA). This EUA will remain  in effect (meaning this test can be used) for the duration of the COVID-19 declaration under Se ction 564(b)(1) of the Act, 21 U.S.C. section 360bbb-3(b)(1), unless the authorization is terminated or revoked sooner.  Performed at [facility], [place] [address] [place], [place] [address]   Resp Panel by RT-PCR (Flu A&B, Covid) Nasopharyngeal Swab     Status: None   Collection Time: 05/08/20  2:14 PM   Specimen: Nasopharyngeal Swab; Nasopharyngeal(NP) swabs in vial transport medium  Result Value Ref Range Status   SARS Coronavirus 2 by RT PCR NEGATIVE NEGATIVE Final    Comment: (NOTE) SARS-CoV-2 target nucleic acids are NOT DETECTED.  The SARS-CoV-2 RNA is generally detectable in upper respiratory specimens during the acute phase of infection. The lowest concentration of SARS-CoV-2 viral copies this assay can detect is 138 copies/mL. A negative result does not preclude  SARS-Cov-2 infection and should not be used as the sole basis for treatment or other patient management decisions. A negative result may occur with  improper specimen collection/handling, submission of specimen other than nasopharyngeal swab, presence of viral mutation(s) within the areas targeted by this assay, and inadequate number of viral copies(<138 copies/mL). A negative result must be combined with clinical observations, patient history, and epidemiological information. The expected result is Negative.  Fact Sheet for Patients:  EntrepreneurPulse.com.au  Fact Sheet for Healthcare Providers:  IncredibleEmployment.be  This test is no t yet approved or cleared by the [place] FDA and  has been authorized for detection and/or diagnosis of SARS-CoV-2 by FDA under an Emergency Use Authorization (EUA). This EUA will remain  in effect (meaning this test can be used) for the duration of the COVID-19 declaration under Section 564(b)(1) of the Act, 21 U.S.C.section 360bbb-3(b)(1), unless the authorization is terminated  or revoked sooner.       Influenza A by PCR NEGATIVE NEGATIVE Final   Influenza B by PCR NEGATIVE NEGATIVE Final    Comment: (NOTE) The Xpert Xpress SARS-CoV-2/FLU/RSV plus assay is intended as an aid in the diagnosis of influenza from Nasopharyngeal swab specimens and should not be used as a sole basis for treatment. Nasal washings and aspirates are unacceptable for Xpert Xpress SARS-CoV-2/FLU/RSV testing.  Fact Sheet for Patients: EntrepreneurPulse.com.au  Fact Sheet for Healthcare Providers: IncredibleEmployment.be  This test is not yet approved or cleared by the [place] FDA and has been authorized for detection and/or diagnosis of SARS-CoV-2 by FDA under an Emergency Use Authorization (EUA). This EUA will remain in effect (meaning this test can be used) for the duration of  the COVID-19 declaration under Section 564(b)(1) of the Act, 21 U.S.C. section 360bbb-3(b)(1), unless the authorization is terminated or revoked.  Performed at [facility], [place] [address] [place], [place] [address]   Blood Culture (routine x 2)     Status: None (Preliminary result)   Collection Time: 05/08/20  2:15 PM   Specimen: BLOOD RIGHT FOREARM  Result Value Ref Range Status   Specimen Description   Final    BLOOD RIGHT FOREARM Performed at [place] [address] [place], [place] [address]    Special  Requests   Final    BOTTLES DRAWN AEROBIC AND ANAEROBIC Blood Culture adequate volume Performed at [place] [address] [place], [place] [address]    Culture   Final    NO GROWTH < 12 HOURS Performed at [place] [address] [place], [place] [address]    Report Status PENDING  Incomplete  Blood Culture (routine x 2)     Status: None (Preliminary result)   Collection Time: 05/08/20  2:15 PM   Specimen: BLOOD LEFT FOREARM  Result Value Ref Range Status   Specimen Description   Final    BLOOD LEFT FOREARM Performed at [place] [address] [place], [place] [address]    Special Requests   Final    BOTTLES DRAWN AEROBIC AND ANAEROBIC Blood Culture adequate volume Performed at [place] [address], [place], [place] [address]    Culture   Final    NO GROWTH < 12 HOURS Performed at [place] [address], [place], [place] [address]    Report Status PENDING  Incomplete     Medications:   . Chlorhexidine Gluconate Cloth  6 each Topical Daily  . pantoprazole  40 mg Oral Daily  . rosuvastatin  10 mg Oral q1800  . Tbo-Filgrastim  300 mcg Subcutaneous ONCE-1800   Continuous Infusions: . ceFEPime (MAXIPIME) IV 2 g (05/09/20 0529)  . vancomycin 1,500 mg (05/09/20 0738)      LOS: 1 day   Charlynne Cousins  [facility]  05/09/2020, 11:23 AM

## 2020-05-09 NOTE — Progress Notes (Signed)
IP PROGRESS NOTE  Subjective:   Kevin Hess reports of feeling well this morning.  He is no longer reporting any fevers, chills or worsening headaches.  He had denies any chest pain or shortness of breath.  He denies any difficulty breathing.  He is a status post chemotherapy for testicular cancer received bleomycin on May 08, 2020.  Objective:  Vital signs in last 24 hours: Temp:  [97.8 F (36.6 C)-103.1 F (39.5 C)] 97.8 F (36.6 C) (04/27 0522) Pulse Rate:  [61-140] 61 (04/27 0522) Resp:  [11-25] 14 (04/27 0522) BP: (115-146)/(73-100) 119/81 (04/27 0522) SpO2:  [96 %-100 %] 98 % (04/27 0522) Weight:  [196 lb (88.9 kg)] 196 lb (88.9 kg) (04/26 1339) Weight change:     Intake/Output from previous day: 04/26 0701 - 04/27 0700 In: 1762 [P.O.:120; I.V.:1542; IV Piggyback:100] Out: 700 [Urine:700]   General: Alert, awake without distress. Head: Normocephalic atraumatic. Mouth: mucous membranes moist, pharynx normal without lesions Eyes: No scleral icterus.  Pupils are equal and round reactive to light. Resp: clear to auscultation bilaterally without rhonchi or wheezes or dullness to percussion. Cardio: regular rate and rhythm, S1, S2 normal, no murmur, click, rub or gallop GI: soft, non-tender; bowel sounds normal; no masses,  no organomegaly Musculoskeletal: No joint deformity or effusion. Neurological: No motor, sensory deficits.  Intact deep tendon reflexes. Skin: No rashes or lesions.  Portacath without erythema  Lab Results: Recent Labs    05/08/20 1414 05/09/20 0529  WBC 0.3* 0.7*  HGB 13.8 11.9*  HCT 40.2 34.8*  PLT 75* 77*    BMET Recent Labs    05/08/20 0844 05/08/20 1414  NA 137 138  K 4.1 4.0  CL 103 104  CO2 25 24  GLUCOSE 123* 111*  BUN 11 16  CREATININE 0.79 0.81  CALCIUM 9.2 9.1    Studies/Results: DG Chest Port 1 View  Result Date: 05/08/2020 CLINICAL DATA:  Questionable sepsis, evaluate for abnormality. EXAM: PORTABLE CHEST 1 VIEW  COMPARISON:  September 20, 2013. FINDINGS: Cardiac silhouette is accentuated by AP technique and low lung volumes. Both lungs are clear. No visible pleural effusions or pneumothorax. No acute osseous abnormality. Partially imaged thoracic spinal fusion hardware. Right IJ approach Port-A-Cath with the tip poorly visualized due to spinal fusion hardware, but likely at the SVC. IMPRESSION: No evidence of acute cardiopulmonary disease. Electronically Signed   By: Margaretha Sheffield MD   On: 05/08/2020 14:44    Medications: I have reviewed the patient's current medications.  Assessment/Plan:  50 year old with:  1.  Neutropenic fever: He is currently on broad-spectrum antibiotics with cultures are currently pending.  He is clinically stable and I agree with the current management.  His fever could also be related to bleomycin and count recovery.  I agree with daily Granix injections while he is hospitalized.  His white cell count is showing recovery so far.  2.  Testicular cancer: He is currently on BEP chemotherapy.  This will be resumed next week if he is recovered from this episode.  3.  Neutropenia: Related to chemotherapy.  He will receive growth factor support after cycle 2 of chemotherapy.  In the meantime I have no objections to Granix to expedite his recovery.  4.  Thrombocytopenia related to chemotherapy: No active bleeding or transfusion noted.   5.  Disposition: I have no objections to discharge the next 24 hours of cultures are negative and his counts continue to recover.   25  minutes were dedicated to this  visit.  50% of the time was face-to-face.  The time was spent on reviewing laboratory data,  discussing treatment options, discussing differential diagnosis and answering questions regarding future plan.       LOS: 1 day   Kevin Hess 05/09/2020, 7:41 AM

## 2020-05-10 DIAGNOSIS — D6959 Other secondary thrombocytopenia: Secondary | ICD-10-CM | POA: Diagnosis not present

## 2020-05-10 DIAGNOSIS — D701 Agranulocytosis secondary to cancer chemotherapy: Secondary | ICD-10-CM | POA: Diagnosis not present

## 2020-05-10 DIAGNOSIS — T451X5A Adverse effect of antineoplastic and immunosuppressive drugs, initial encounter: Secondary | ICD-10-CM | POA: Diagnosis not present

## 2020-05-10 DIAGNOSIS — C6291 Malignant neoplasm of right testis, unspecified whether descended or undescended: Secondary | ICD-10-CM | POA: Diagnosis not present

## 2020-05-10 LAB — URINE CULTURE: Culture: NO GROWTH

## 2020-05-10 LAB — CBC WITH DIFFERENTIAL/PLATELET
Abs Immature Granulocytes: 0.2 10*3/uL — ABNORMAL HIGH (ref 0.00–0.07)
Band Neutrophils: 16 %
Basophils Absolute: 0 10*3/uL (ref 0.0–0.1)
Basophils Relative: 0 %
Eosinophils Absolute: 0.1 10*3/uL (ref 0.0–0.5)
Eosinophils Relative: 3 %
HCT: 39.7 % (ref 39.0–52.0)
Hemoglobin: 13.6 g/dL (ref 13.0–17.0)
Lymphocytes Relative: 34 %
Lymphs Abs: 0.6 10*3/uL — ABNORMAL LOW (ref 0.7–4.0)
MCH: 30.4 pg (ref 26.0–34.0)
MCHC: 34.3 g/dL (ref 30.0–36.0)
MCV: 88.6 fL (ref 80.0–100.0)
Metamyelocytes Relative: 8 %
Monocytes Absolute: 0.1 10*3/uL (ref 0.1–1.0)
Monocytes Relative: 5 %
Myelocytes: 5 %
Neutro Abs: 0.8 10*3/uL — ABNORMAL LOW (ref 1.7–7.7)
Neutrophils Relative %: 28 %
Other: 1 %
Platelets: 93 10*3/uL — ABNORMAL LOW (ref 150–400)
RBC: 4.48 MIL/uL (ref 4.22–5.81)
RDW: 12.3 % (ref 11.5–15.5)
WBC Morphology: ABNORMAL
WBC: 1.9 10*3/uL — ABNORMAL LOW (ref 4.0–10.5)
nRBC: 0 % (ref 0.0–0.2)

## 2020-05-10 MED ORDER — PANTOPRAZOLE SODIUM 40 MG PO TBEC
40.0000 mg | DELAYED_RELEASE_TABLET | Freq: Two times a day (BID) | ORAL | Status: DC
  Administered 2020-05-10 – 2020-05-11 (×3): 40 mg via ORAL
  Filled 2020-05-10 (×3): qty 1

## 2020-05-10 MED ORDER — POLYETHYLENE GLYCOL 3350 17 G PO PACK
17.0000 g | PACK | Freq: Two times a day (BID) | ORAL | Status: AC
  Administered 2020-05-10 (×2): 17 g via ORAL
  Filled 2020-05-10 (×2): qty 1

## 2020-05-10 MED ORDER — OXYCODONE HCL 5 MG PO TABS
5.0000 mg | ORAL_TABLET | ORAL | Status: DC | PRN
  Administered 2020-05-10: 5 mg via ORAL
  Filled 2020-05-10: qty 1

## 2020-05-10 NOTE — Progress Notes (Signed)
IP PROGRESS NOTE  Subjective:   No issues reported overnight.  He denies any fevers, chills or sweats.  He denies any worsening headaches at this time.  He did report  substernal chest pain relieved by nausea medication.  He is able to ambulate without any difficulties.  He denies shortness of breath or difficulty breathing.  Objective:  Vital signs in last 24 hours: Temp:  [98.5 F (36.9 C)-99.2 F (37.3 C)] 98.6 F (37 C) (04/28 0428) Pulse Rate:  [60-75] 60 (04/28 0428) Resp:  [16-20] 20 (04/28 0428) BP: (112-129)/(75-88) 112/75 (04/28 0428) SpO2:  [97 %-99 %] 97 % (04/28 0428) Weight change:  Last BM Date: 05/09/20  Intake/Output from previous day: 04/27 0701 - 04/28 0700 In: 2539.4 [P.O.:600; I.V.:823.8; IV Piggyback:1115.6] Out: 300 [Urine:300]    General appearance: Comfortable appearing without any discomfort Head: Normocephalic without any trauma Oropharynx: Mucous membranes are moist and pink without any thrush or ulcers. Eyes: Pupils are equal and round reactive to light. Lymph nodes: No cervical, supraclavicular, inguinal or axillary lymphadenopathy.   Heart:regular rate and rhythm.  S1 and S2 without leg edema. Lung: Clear without any rhonchi or wheezes.  No dullness to percussion. Abdomin: Soft, nontender, nondistended with good bowel sounds.  No hepatosplenomegaly. Musculoskeletal: No joint deformity or effusion.  Full range of motion noted. Neurological: No deficits noted on motor, sensory and deep tendon reflex exam. Skin: No petechial rash or dryness.  Appeared moist.     Lab Results: Recent Labs    05/08/20 1414 05/09/20 0529  WBC 0.3* 0.7*  HGB 13.8 11.9*  HCT 40.2 34.8*  PLT 75* 77*    BMET Recent Labs    05/08/20 0844 05/08/20 1414  NA 137 138  K 4.1 4.0  CL 103 104  CO2 25 24  GLUCOSE 123* 111*  BUN 11 16  CREATININE 0.79 0.81  CALCIUM 9.2 9.1    Studies/Results: DG Chest Port 1 View  Result Date: 05/08/2020 CLINICAL DATA:   Questionable sepsis, evaluate for abnormality. EXAM: PORTABLE CHEST 1 VIEW COMPARISON:  September 20, 2013. FINDINGS: Cardiac silhouette is accentuated by AP technique and low lung volumes. Both lungs are clear. No visible pleural effusions or pneumothorax. No acute osseous abnormality. Partially imaged thoracic spinal fusion hardware. Right IJ approach Port-A-Cath with the tip poorly visualized due to spinal fusion hardware, but likely at the SVC. IMPRESSION: No evidence of acute cardiopulmonary disease. Electronically Signed   By: Margaretha Sheffield MD   On: 05/08/2020 14:44    Medications: I have reviewed the patient's current medications.  Assessment/Plan:  50 year old with:  1.  Neutropenic fever: No evidence of sepsis at this time.  Blood culture remains negative and he is afebrile.  No clear source of infection at this time.  His fever could be related to bleomycin.  His CBC is currently pending from today and I have no objections to this continuing intravenous antibiotics it is no longer neutropenic.   2.  Testicular cancer: He will receive the second cycle of chemotherapy next week pending his discharge recovery.  3.  Neutropenia: He received Granix yesterday and would repeat today only if he is neutropenic.  4.  Thrombocytopenia: Mild and asymptomatic.  No need for transfusion.  5.  Substernal chest pain: Appears to be more gastric in nature but could warrant more observation.   5.  Disposition: He appears to be clinically stable and I do not have any objections to discharge if his absolute neutrophil count above 500  today.   25  minutes were dedicated to this visit.  50% of the time was face-to-face.  The time was spent on reviewing his disease status, reviewing treatment options and complications related to his cancer and cancer therapy.       LOS: 2 days   Zola Button 05/10/2020, 8:24 AM

## 2020-05-10 NOTE — Progress Notes (Signed)
[facility] PROGRESS NOTE    Progress Note  Kevin Hess  PXT:[id number] DOB: [birth year] DOA: 05/08/2020 PCP: Sueanne Margarita, DO     Brief Narrative:   Kevin Hess is an 50 y.o. male with medical history significant of testicular cancer in 2009 status post orchiectomy then again in May 2021 status post orchiectomy status post adjuvant radiation therapy now on BEP was completed his first cycle his last treatment was on 05/08/2020 went home and started having fever and chills check his temperature at home and it was 99 took some Tylenol but it did not improve he felt so bad he had to come back to the ED.    Assessment/Plan:   Active Problems:   Neutropenic fever (HCC) Continue IV empiric vancomycin and cefepime he has remained afebrile CBC with differential is pending. He will be discharged once his [place] is greater than 500 and blood cultures remain negative. Question if due to bleomycin. Has remained afebrile since admission. Was given Granix on 05/09/2020. Continue tramadol for headaches Tylenol for fevers.  Mild thrombocytopenia: No signs of bleeding continue to monitor intermittently.  Constipation: Continue MiraLAX.   DVT prophylaxis: lovenox Family Communication:none Status is: Inpatient  Remains inpatient appropriate because:Hemodynamically unstable   Dispo: The patient is from: Home              Anticipated d/c is to: Home              Patient currently is not medically stable to d/c.   Difficult to place patient No        Code Status:     Code Status Orders  (From admission, onward)         Start     Ordered   05/08/20 1706  Full code  Continuous        05/08/20 1705        Code Status History    This patient has a current code status but no historical code status.   Advance Care Planning Activity    Advance Directive Documentation   Flowsheet Row Most Recent Value  Type of Advance Directive Healthcare Power of Attorney, Living will   Pre-existing out of facility DNR order (yellow form or pink MOST form) --  "MOST" Form in Place? --        IV Access:    Peripheral IV   Procedures and diagnostic studies:   DG Chest Port 1 View  Result Date: 05/08/2020 CLINICAL DATA:  Questionable sepsis, evaluate for abnormality. EXAM: PORTABLE CHEST 1 VIEW COMPARISON:  September 20, 2013. FINDINGS: Cardiac silhouette is accentuated by AP technique and low lung volumes. Both lungs are clear. No visible pleural effusions or pneumothorax. No acute osseous abnormality. Partially imaged thoracic spinal fusion hardware. Right IJ approach Port-A-Cath with the tip poorly visualized due to spinal fusion hardware, but likely at the SVC. IMPRESSION: No evidence of acute cardiopulmonary disease. Electronically Signed   By: Margaretha Sheffield MD   On: 05/08/2020 14:44     Medical Consultants:    None.   Subjective:    Kevin Hess  having regular bowel movements.  Objective:    Vitals:   05/09/20 0522 05/09/20 1423 05/09/20 1952 05/10/20 0428  BP: 119/81 129/80 125/88 112/75  Pulse: 61 66 75 60  Resp: 14 16 16 20   Temp: 97.8 F (36.6 C) 98.5 F (36.9 C) 99.2 F (37.3 C) 98.6 F (37 C)  TempSrc: Oral Oral Oral Oral  SpO2: 98% 99% 99% 97%  Weight:      Height:       SpO2: 97 %   Intake/Output Summary (Last 24 hours) at 05/10/2020 0927 Last data filed at 05/10/2020 [id number] Gross per 24 hour  Intake 2419.39 ml  Output 300 ml  Net 2119.39 ml   Filed Weights   05/08/20 1321 05/08/20 1339  Weight: 88.9 kg 88.9 kg    Exam: General exam: In no acute distress. Respiratory system: Good air movement and clear to auscultation. Cardiovascular system: S1 & S2 heard, RRR. No JVD. Gastrointestinal system: Abdomen is nondistended, soft and nontender.  Extremities: No pedal edema. Skin: No rashes, lesions or ulcers Psychiatry: Judgement and insight appear normal. Mood & affect appropriate.  Data Reviewed:    Labs: Basic  Metabolic Panel: Recent Labs  Lab 05/08/20 0844 05/08/20 1414  NA 137 138  K 4.1 4.0  CL 103 104  CO2 25 24  GLUCOSE 123* 111*  BUN 11 16  CREATININE 0.79 0.81  CALCIUM 9.2 9.1   GFR Estimated Creatinine Clearance: 119.8 mL/min (by C-G formula based on SCr of 0.81 mg/dL). Liver Function Tests: Recent Labs  Lab 05/08/20 0844 05/08/20 1414  AST 12* 17  ALT 17 21  ALKPHOS 58 48  BILITOT 0.3 0.3  PROT 7.1 7.5  ALBUMIN 3.7 4.2   No results for input(s): LIPASE, AMYLASE in the last 168 hours. No results for input(s): AMMONIA in the last 168 hours. Coagulation profile Recent Labs  Lab 05/08/20 1414  INR 0.9   COVID-19 Labs  No results for input(s): DDIMER, FERRITIN, LDH, CRP in the last 72 hours.  Lab Results  Component Value Date   SARSCOV2NAA NEGATIVE 05/08/2020   [place] NEGATIVE 04/30/2020   [place] NEGATIVE 05/27/2019    CBC: Recent Labs  Lab 05/08/20 0844 05/08/20 1414 05/09/20 0529  WBC 0.6* 0.3* 0.7*  NEUTROABS 0.0* 0.0*  --   HGB 13.7 13.8 11.9*  HCT 40.3 40.2 34.8*  MCV 88.4 88.2 89.5  PLT 72* 75* 77*   Cardiac Enzymes: Recent Labs  Lab 05/08/20 1414  CKTOTAL 60   BNP (last 3 results) No results for input(s): PROBNP in the last 8760 hours. CBG: No results for input(s): GLUCAP in the last 168 hours. D-Dimer: No results for input(s): DDIMER in the last 72 hours. Hgb A1c: No results for input(s): HGBA1C in the last 72 hours. Lipid Profile: No results for input(s): CHOL, HDL, LDLCALC, TRIG, CHOLHDL, LDLDIRECT in the last 72 hours. Thyroid function studies: No results for input(s): TSH, T4TOTAL, T3FREE, THYROIDAB in the last 72 hours.  Invalid input(s): FREET3 Anemia work up: No results for input(s): VITAMINB12, FOLATE, FERRITIN, TIBC, IRON, RETICCTPCT in the last 72 hours. Sepsis Labs: Recent Labs  Lab 05/08/20 0844 05/08/20 1414 05/08/20 1730 05/09/20 0529  WBC 0.6* 0.3*  --  0.7*  LATICACIDVEN  --  1.4 1.6  --     Microbiology Recent Results (from the past 240 hour(s))  SARS CORONAVIRUS 2 (TAT 6-24 HRS) Nasopharyngeal Nasopharyngeal Swab     Status: None   Collection Time: 04/30/20  3:05 PM   Specimen: Nasopharyngeal Swab  Result Value Ref Range Status   SARS Coronavirus 2 NEGATIVE NEGATIVE Final    Comment: (NOTE) SARS-CoV-2 target nucleic acids are NOT DETECTED.  The SARS-CoV-2 RNA is generally detectable in upper and lower respiratory specimens during the acute phase of infection. Negative results do not preclude SARS-CoV-2 infection, do not rule out co-infections with other pathogens, and should not be used as  the sole basis for treatment or other patient management decisions. Negative results must be combined with clinical observations, patient history, and epidemiological information. The expected result is Negative.  Fact Sheet for Patients: SugarRoll.be  Fact Sheet for Healthcare Providers: https://www.woods-mathews.com/  This test is not yet approved or cleared by the [place] FDA and  has been authorized for detection and/or diagnosis of SARS-CoV-2 by FDA under an Emergency Use Authorization (EUA). This EUA will remain  in effect (meaning this test can be used) for the duration of the COVID-19 declaration under Se ction 564(b)(1) of the Act, 21 U.S.C. section 360bbb-3(b)(1), unless the authorization is terminated or revoked sooner.  Performed at [facility], [place] [address] [place], [place] [address]   Resp Panel by RT-PCR (Flu A&B, Covid) Nasopharyngeal Swab     Status: None   Collection Time: 05/08/20  2:14 PM   Specimen: Nasopharyngeal Swab; Nasopharyngeal(NP) swabs in vial transport medium  Result Value Ref Range Status   SARS Coronavirus 2 by RT PCR NEGATIVE NEGATIVE Final    Comment: (NOTE) SARS-CoV-2 target nucleic acids are NOT DETECTED.  The SARS-CoV-2 RNA is generally detectable in upper  respiratory specimens during the acute phase of infection. The lowest concentration of SARS-CoV-2 viral copies this assay can detect is 138 copies/mL. A negative result does not preclude SARS-Cov-2 infection and should not be used as the sole basis for treatment or other patient management decisions. A negative result may occur with  improper specimen collection/handling, submission of specimen other than nasopharyngeal swab, presence of viral mutation(s) within the areas targeted by this assay, and inadequate number of viral copies(<138 copies/mL). A negative result must be combined with clinical observations, patient history, and epidemiological information. The expected result is Negative.  Fact Sheet for Patients:  EntrepreneurPulse.com.au  Fact Sheet for Healthcare Providers:  IncredibleEmployment.be  This test is no t yet approved or cleared by the [place] FDA and  has been authorized for detection and/or diagnosis of SARS-CoV-2 by FDA under an Emergency Use Authorization (EUA). This EUA will remain  in effect (meaning this test can be used) for the duration of the COVID-19 declaration under Section 564(b)(1) of the Act, 21 U.S.C.section 360bbb-3(b)(1), unless the authorization is terminated  or revoked sooner.       Influenza A by PCR NEGATIVE NEGATIVE Final   Influenza B by PCR NEGATIVE NEGATIVE Final    Comment: (NOTE) The Xpert Xpress SARS-CoV-2/FLU/RSV plus assay is intended as an aid in the diagnosis of influenza from Nasopharyngeal swab specimens and should not be used as a sole basis for treatment. Nasal washings and aspirates are unacceptable for Xpert Xpress SARS-CoV-2/FLU/RSV testing.  Fact Sheet for Patients: EntrepreneurPulse.com.au  Fact Sheet for Healthcare Providers: IncredibleEmployment.be  This test is not yet approved or cleared by the [place] FDA and has been  authorized for detection and/or diagnosis of SARS-CoV-2 by FDA under an Emergency Use Authorization (EUA). This EUA will remain in effect (meaning this test can be used) for the duration of the COVID-19 declaration under Section 564(b)(1) of the Act, 21 U.S.C. section 360bbb-3(b)(1), unless the authorization is terminated or revoked.  Performed at [facility], [place] [address], [place], [place] [address]   Urine culture     Status: None   Collection Time: 05/08/20  2:14 PM   Specimen: In/Out Cath Urine  Result Value Ref Range Status   Specimen Description   Final    IN/OUT CATH URINE Performed at [facility]  [place] [address], [place], [place] [address]    Special Requests   Final    NONE Performed at [facility], [place] [address] [place], [place] [address]    Culture   Final    NO GROWTH Performed at [facility], [place] [address], [place], [place] [address]    Report Status 05/10/2020 FINAL  Final  Blood Culture (routine x 2)     Status: None (Preliminary result)   Collection Time: 05/08/20  2:15 PM   Specimen: BLOOD RIGHT FOREARM  Result Value Ref Range Status   Specimen Description   Final    BLOOD RIGHT FOREARM Performed at [place] [address], [place], [place] [address]    Special Requests   Final    BOTTLES DRAWN AEROBIC AND ANAEROBIC Blood Culture adequate volume Performed at [place] [address], [place], [place] [address]    Culture   Final    NO GROWTH 2 DAYS Performed at [place] [address] [place], [place] [address]    Report Status PENDING  Incomplete  Blood Culture (routine x 2)     Status: None (Preliminary result)   Collection Time: 05/08/20  2:15 PM   Specimen: BLOOD LEFT FOREARM  Result Value Ref Range Status   Specimen Description   Final    BLOOD LEFT FOREARM Performed at [facility]  [address], [place], [place] [address]    Special Requests   Final    BOTTLES DRAWN AEROBIC AND ANAEROBIC Blood Culture adequate volume Performed at [place] [address], [place], [place] [address]    Culture   Final    NO GROWTH 2 DAYS Performed at [place] [address] [place],  [address]    Report Status PENDING  Incomplete     Medications:   . Chlorhexidine Gluconate Cloth  6 each Topical Daily  . pantoprazole  40 mg Oral Daily  . rosuvastatin  10 mg Oral q1800  . Tbo-Filgrastim  300 mcg Subcutaneous q1800   Continuous Infusions: . ceFEPime (MAXIPIME) IV Stopped (05/10/20 0536)  . vancomycin 150 mL/hr at 05/10/20 0624      LOS: 2 days   Charlynne Cousins  [facility]  05/10/2020, 9:27 AM

## 2020-05-11 LAB — CBC WITH DIFFERENTIAL/PLATELET
Abs Immature Granulocytes: 0.36 10*3/uL — ABNORMAL HIGH (ref 0.00–0.07)
Basophils Absolute: 0 10*3/uL (ref 0.0–0.1)
Basophils Relative: 1 %
Eosinophils Absolute: 0 10*3/uL (ref 0.0–0.5)
Eosinophils Relative: 2 %
HCT: 35.5 % — ABNORMAL LOW (ref 39.0–52.0)
Hemoglobin: 12.1 g/dL — ABNORMAL LOW (ref 13.0–17.0)
Immature Granulocytes: 19 %
Lymphocytes Relative: 31 %
Lymphs Abs: 0.6 10*3/uL — ABNORMAL LOW (ref 0.7–4.0)
MCH: 30.3 pg (ref 26.0–34.0)
MCHC: 34.1 g/dL (ref 30.0–36.0)
MCV: 89 fL (ref 80.0–100.0)
Monocytes Absolute: 0.3 10*3/uL (ref 0.1–1.0)
Monocytes Relative: 16 %
Neutro Abs: 0.6 10*3/uL — ABNORMAL LOW (ref 1.7–7.7)
Neutrophils Relative %: 31 %
Platelets: 79 10*3/uL — ABNORMAL LOW (ref 150–400)
RBC: 3.99 MIL/uL — ABNORMAL LOW (ref 4.22–5.81)
RDW: 12.4 % (ref 11.5–15.5)
WBC: 2 10*3/uL — ABNORMAL LOW (ref 4.0–10.5)
nRBC: 1 % — ABNORMAL HIGH (ref 0.0–0.2)

## 2020-05-11 MED ORDER — TRAMADOL HCL 50 MG PO TABS: 100.0000 mg | ORAL_TABLET | Freq: Four times a day (QID) | ORAL | 0 refills | Status: DC | PRN

## 2020-05-11 MED ORDER — ONDANSETRON 4 MG PO TBDP: 4.0000 mg | ORAL_TABLET | Freq: Three times a day (TID) | ORAL | 0 refills | Status: DC | PRN

## 2020-05-11 MED ORDER — IBUPROFEN 100 MG/5ML PO SUSP
600.0000 mg | Freq: Once | ORAL | Status: AC | PRN
  Administered 2020-05-11: 600 mg via ORAL
  Filled 2020-05-11: qty 30

## 2020-05-11 MED ORDER — AMOXICILLIN-POT CLAVULANATE 875-125 MG PO TABS: 1.0000 | ORAL_TABLET | Freq: Two times a day (BID) | ORAL | 0 refills | Status: AC

## 2020-05-11 MED ORDER — HEPARIN SOD (PORK) LOCK FLUSH 100 UNIT/ML IV SOLN
500.0000 [IU] | INTRAVENOUS | Status: AC | PRN
  Administered 2020-05-11: 500 [IU]
  Filled 2020-05-11: qty 5

## 2020-05-11 NOTE — Discharge Summary (Addendum)
Physician Discharge Summary  Royce Stegman OZH:[id number] DOB: 1970-09-14 DOA: 05/08/2020  PCP: Sueanne Margarita, DO  Admit date: 05/08/2020 Discharge date: 05/11/2020  Admitted From: Home Disposition:  Home  Recommendations for Outpatient Follow-up:  1. Follow up with Oncology in 1 weeks 2. Please obtain BMP/CBC in one week  Home Health:no Equipment/Devices:None  Discharge Condition:Stable CODE STATUS:Full Diet recommendation: Heart Healthy  Brief/Interim Summary: 50 y.o. male with medical history significant oftesticular cancer in 2009 status post orchiectomy then again in May 2021 status post orchiectomy status post adjuvant radiation therapy now onBEPwas completed his first cycle his last treatment was on 05/08/2020 went home and started having fever and chills check his temperature at home and it was 99 took some Tylenol but it did not improve he felt so bad he had to come back to the ED  Discharge Diagnoses:  Active Problems:   Neutropenic fever ([place])  Neutropenic fever: He was started empirically on IV vancomycin and cefepime he was given Granix as his ANC was less than 500. Culture remained negative till date he was transitioned to oral Augmentin which should continue as an outpatient has remained febrile since admission. He was given tramadol for headaches and bone pain as Tylenol was not helping, cannot use ibuprofen as his platelets were low. He will follow-up with Dr. Alen Blew as an outpatient.  There is a question if his reaction was likely due to bleomycin.  Mild thrombocytopenia: Likely due to chemotherapy no signs of overt bleeding.  Constipation: Continue MiraLAX.  Discharge Instructions  Discharge Instructions    Diet - low sodium heart healthy   Complete by: As directed    Increase activity slowly   Complete by: As directed      Allergies as of 05/11/2020      Reactions   Penicillins    Rash age 50      Medication List    TAKE these medications    acetaminophen 500 MG tablet Commonly known as: TYLENOL Take 1,000 mg by mouth every 6 (six) hours as needed for moderate pain.   ALIGN PREBIOTIC-PROBIOTIC PO Take 1 tablet by mouth daily.   amoxicillin-clavulanate 875-125 MG tablet Commonly known as: Augmentin Take 1 tablet by mouth 2 (two) times daily for 4 days.   cyclobenzaprine 10 MG tablet Commonly known as: FLEXERIL Take 10 mg by mouth 3 (three) times daily as needed for muscle spasms. As needed   docusate sodium 100 MG capsule Commonly known as: COLACE Take 100 mg by mouth 2 (two) times daily.   esomeprazole 20 MG capsule Commonly known as: NEXIUM Take 20 mg by mouth at bedtime.   fluticasone 50 MCG/ACT nasal spray Commonly known as: FLONASE Place 2 sprays into both nostrils daily.   lidocaine-prilocaine cream Commonly known as: EMLA Apply 1 application topically as needed. What changed:   when to take this  reasons to take this   ondansetron 4 MG disintegrating tablet Commonly known as: Zofran ODT Take 1 tablet (4 mg total) by mouth every 8 (eight) hours as needed for nausea or vomiting.   prochlorperazine 10 MG tablet Commonly known as: COMPAZINE Take 1 tablet (10 mg total) by mouth every 6 (six) hours as needed for nausea or vomiting.   rosuvastatin 10 MG tablet Commonly known as: CRESTOR Take 10 mg by mouth daily.   testosterone cypionate 200 MG/ML injection Commonly known as: DEPOTESTOSTERONE CYPIONATE Inject 80 mg into the muscle See admin instructions. Every 9 days   traMADol 50 MG tablet Commonly  known as: ULTRAM Take 2 tablets (100 mg total) by mouth every 6 (six) hours as needed for severe pain.   Vitamin D3 125 MCG (5000 UT) Caps Take 5,000 Units by mouth daily. Takes Monday thru friday       Allergies  Allergen Reactions  . Penicillins     Rash age 50    Consultations:  Oncology   Procedures/Studies: DG Chest Port 1 View  Result Date: 05/08/2020 CLINICAL DATA:   Questionable sepsis, evaluate for abnormality. EXAM: PORTABLE CHEST 1 VIEW COMPARISON:  September 20, 2013. FINDINGS: Cardiac silhouette is accentuated by AP technique and low lung volumes. Both lungs are clear. No visible pleural effusions or pneumothorax. No acute osseous abnormality. Partially imaged thoracic spinal fusion hardware. Right IJ approach Port-A-Cath with the tip poorly visualized due to spinal fusion hardware, but likely at the SVC. IMPRESSION: No evidence of acute cardiopulmonary disease. 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 EXAM: IMPLANTED PORT A CATH PLACEMENT WITH ULTRASOUND AND FLUOROSCOPIC GUIDANCE MEDICATIONS: None ANESTHESIA/SEDATION: Moderate (conscious) sedation was employed during this procedure. A total of Versed 4 mg and Fentanyl 100 mcg was administered intravenously. Moderate Sedation Time: 23 minutes. The patient's level of consciousness and vital signs were monitored continuously by radiology nursing throughout the procedure under my direct supervision. FLUOROSCOPY TIME:  0 minutes, 36 seconds (8 mGy) COMPLICATIONS: None immediate. PROCEDURE: The procedure, risks, benefits, and alternatives were explained to the patient. Questions regarding the procedure were encouraged and answered. The patient understands and consents to the procedure. A timeout was performed prior to the initiation of the procedure. Patient positioned supine on the angiography table. Right neck and anterior upper chest prepped and draped in the usual sterile fashion. All elements of maximal sterile barrier were utilized including, cap, mask, sterile gown, sterile gloves, large sterile drape, hand scrubbing and 2% Chlorhexidine for skin cleaning. The right internal jugular vein was evaluated with  ultrasound and shown to be patent. A permanent ultrasound image was obtained and placed in the patient's medical record. Local anesthesia was provided with 1% lidocaine with epinephrine. Using sterile gel and a sterile probe cover, the right internal jugular vein was entered with a 21 ga needle during real time ultrasound guidance. 0.018 inch guidewire placed and 21 ga needle exchanged for transitional dilator set. Utilizing fluoroscopy, 0.035 inch guidewire advanced through the needle without difficulty. Attention then turned to the right anterior upper chest. Following local lidocaine administration, a port pocket was created. The catheter was connected to the port and brought from the pocket to the venotomy site through a subcutaneous tunnel. The catheter was cut to size and inserted through the peel-away sheath. The catheter tip was positioned at the cavoatrial junction using fluoroscopic guidance. The port aspirated and flushed well. The port pocket was closed with deep and superficial absorbable suture. The port pocket incision and venotomy sites were also sealed with Dermabond. IMPRESSION: Successful placement of a right internal jugular approach power injectable Port-A-Cath. The catheter is ready for immediate use. Electronically Signed   By: Miachel Roux M.D.   On: 04/13/2020 14:53     Subjective: No new complaints.  Discharge Exam: Vitals:   05/11/20 0251 05/11/20 0609  BP:  109/78  Pulse:  71  Resp:  18  Temp: 98.4 F (36.9 C) 98.3  F (36.8 C)  SpO2:  99%   Vitals:   05/11/20 0029 05/11/20 0101 05/11/20 0251 05/11/20 0609  BP:    109/78  Pulse:    71  Resp:    18  Temp: (!) 101.3 F (38.5 C) (!) 100.6 F (38.1 C) 98.4 F (36.9 C) 98.3 F (36.8 C)  TempSrc: Oral Oral Oral Oral  SpO2:    99%  Weight:      Height:        General: Pt is alert, awake, not in acute distress Cardiovascular: RRR, S1/S2 +, no rubs, no gallops Respiratory: CTA bilaterally, no wheezing, no  rhonchi Abdominal: Soft, NT, ND, bowel sounds + Extremities: no edema, no cyanosis    The results of significant diagnostics from this hospitalization (including imaging, microbiology, ancillary and laboratory) are listed below for reference.     Microbiology: Recent Results (from the past 240 hour(s))  Resp Panel by RT-PCR (Flu A&B, Covid) Nasopharyngeal Swab     Status: None   Collection Time: 05/08/20  2:14 PM   Specimen: Nasopharyngeal Swab; Nasopharyngeal(NP) swabs in vial transport medium  Result Value Ref Range Status   SARS Coronavirus 2 by RT PCR NEGATIVE NEGATIVE Final    Comment: (NOTE) SARS-CoV-2 target nucleic acids are NOT DETECTED.  The SARS-CoV-2 RNA is generally detectable in upper respiratory specimens during the acute phase of infection. The lowest concentration of SARS-CoV-2 viral copies this assay can detect is 138 copies/mL. A negative result does not preclude SARS-Cov-2 infection and should not be used as the sole basis for treatment or other patient management decisions. A negative result may occur with  improper specimen collection/handling, submission of specimen other than nasopharyngeal swab, presence of viral mutation(s) within the areas targeted by this assay, and inadequate number of viral copies(<138 copies/mL). A negative result must be combined with clinical observations, patient history, and epidemiological information. The expected result is Negative.  Fact Sheet for Patients:  EntrepreneurPulse.com.au  Fact Sheet for Healthcare Providers:  IncredibleEmployment.be  This test is no t yet approved or cleared by the [place] FDA and  has been authorized for detection and/or diagnosis of SARS-CoV-2 by FDA under an Emergency Use Authorization (EUA). This EUA will remain  in effect (meaning this test can be used) for the duration of the COVID-19 declaration under Section 564(b)(1) of the Act,  21 U.S.C.section 360bbb-3(b)(1), unless the authorization is terminated  or revoked sooner.       Influenza A by PCR NEGATIVE NEGATIVE Final   Influenza B by PCR NEGATIVE NEGATIVE Final    Comment: (NOTE) The Xpert Xpress SARS-CoV-2/FLU/RSV plus assay is intended as an aid in the diagnosis of influenza from Nasopharyngeal swab specimens and should not be used as a sole basis for treatment. Nasal washings and aspirates are unacceptable for Xpert Xpress SARS-CoV-2/FLU/RSV testing.  Fact Sheet for Patients: EntrepreneurPulse.com.au  Fact Sheet for Healthcare Providers: IncredibleEmployment.be  This test is not yet approved or cleared by the [place] FDA and has been authorized for detection and/or diagnosis of SARS-CoV-2 by FDA under an Emergency Use Authorization (EUA). This EUA will remain in effect (meaning this test can be used) for the duration of the COVID-19 declaration under Section 564(b)(1) of the Act, 21 U.S.C. section 360bbb-3(b)(1), unless the authorization is terminated or revoked.  Performed at [facility], [place] [address] [place], [place] [address]   Urine culture     Status: None   Collection Time: 05/08/20  2:14 PM  Specimen: In/Out Cath Urine  Result Value Ref Range Status   Specimen Description   Final    IN/OUT CATH URINE Performed at [place] [address], [place], [place] [address]    Special Requests   Final    NONE Performed at [facility], [place] [address] [place], [place] [address]    Culture   Final    NO GROWTH Performed at [facility], [place] [address], [place], [place] [address]    Report Status 05/10/2020 FINAL  Final  Blood Culture (routine x 2)     Status: None (Preliminary result)   Collection Time: 05/08/20  2:15 PM   Specimen: BLOOD RIGHT FOREARM  Result Value Ref Range Status   Specimen Description   Final    BLOOD  RIGHT FOREARM Performed at [facility] [address], [place], [place] [address]    Special Requests   Final    BOTTLES DRAWN AEROBIC AND ANAEROBIC Blood Culture adequate volume Performed at [facility] [address] [place], [place] [address]    Culture   Final    NO GROWTH 3 DAYS Performed at [facility], [place] [address], [place], [place] [address]    Report Status PENDING  Incomplete  Blood Culture (routine x 2)     Status: None (Preliminary result)   Collection Time: 05/08/20  2:15 PM   Specimen: BLOOD LEFT FOREARM  Result Value Ref Range Status   Specimen Description   Final    BLOOD LEFT FOREARM Performed at [facility] [address], [place], [place] [address]    Special Requests   Final    BOTTLES DRAWN AEROBIC AND ANAEROBIC Blood Culture adequate volume Performed at [facility] [address], [place], [place] [address]    Culture   Final    NO GROWTH 3 DAYS Performed at [facility], [place] [address] [place], [place] [address]    Report Status PENDING  Incomplete     Labs: BNP (last 3 results) No results for input(s): BNP in the last 8760 hours. Basic Metabolic Panel: Recent Labs  Lab 05/08/20 0844 05/08/20 1414  NA 137 138  K 4.1 4.0  CL 103 104  CO2 25 24  GLUCOSE 123* 111*  BUN 11 16  CREATININE 0.79 0.81  CALCIUM 9.2 9.1   Liver Function Tests: Recent Labs  Lab 05/08/20 0844 05/08/20 1414  AST 12* 17  ALT 17 21  ALKPHOS 58 48  BILITOT 0.3 0.3  PROT 7.1 7.5  ALBUMIN 3.7 4.2   No results for input(s): LIPASE, AMYLASE in the last 168 hours. No results for input(s): AMMONIA in the last 168 hours. CBC: Recent Labs  Lab 05/08/20 0844 05/08/20 1414 05/09/20 0529 05/10/20 1055 05/11/20 0504  WBC 0.6* 0.3* 0.7* 1.9* 2.0*  NEUTROABS 0.0* 0.0*  --  0.8* 0.6*  HGB 13.7 13.8 11.9* 13.6 12.1*  HCT 40.3 40.2 34.8* 39.7 35.5*  MCV 88.4 88.2 89.5  88.6 89.0  PLT 72* 75* 77* 93* 79*   Cardiac Enzymes: Recent Labs  Lab 05/08/20 1414  CKTOTAL 60   BNP: Invalid input(s): POCBNP CBG: No results for input(s): GLUCAP in the last 168 hours. D-Dimer No results for input(s): DDIMER in the last 72 hours. Hgb A1c No results for input(s): HGBA1C in the last 72 hours. Lipid Profile No results for input(s): CHOL, HDL, LDLCALC, TRIG, CHOLHDL, LDLDIRECT in  the last 72 hours. Thyroid function studies No results for input(s): TSH, T4TOTAL, T3FREE, THYROIDAB in the last 72 hours.  Invalid input(s): FREET3 Anemia work up No results for input(s): VITAMINB12, FOLATE, FERRITIN, TIBC, IRON, RETICCTPCT in the last 72 hours. Urinalysis    Component Value Date/Time   COLORURINE YELLOW 05/08/2020 1414   APPEARANCEUR CLEAR 05/08/2020 1414   LABSPEC 1.019 05/08/2020 1414   PHURINE 5.0 05/08/2020 1414   GLUCOSEU NEGATIVE 05/08/2020 1414   HGBUR MODERATE (A) 05/08/2020 1414   BILIRUBINUR NEGATIVE 05/08/2020 1414   KETONESUR NEGATIVE 05/08/2020 1414   PROTEINUR NEGATIVE 05/08/2020 1414   NITRITE NEGATIVE 05/08/2020 1414   LEUKOCYTESUR NEGATIVE 05/08/2020 1414   Sepsis Labs Invalid input(s): PROCALCITONIN,  WBC,  LACTICIDVEN Microbiology Recent Results (from the past 240 hour(s))  Resp Panel by RT-PCR (Flu A&B, Covid) Nasopharyngeal Swab     Status: None   Collection Time: 05/08/20  2:14 PM   Specimen: Nasopharyngeal Swab; Nasopharyngeal(NP) swabs in vial transport medium  Result Value Ref Range Status   SARS Coronavirus 2 by RT PCR NEGATIVE NEGATIVE Final    Comment: (NOTE) SARS-CoV-2 target nucleic acids are NOT DETECTED.  The SARS-CoV-2 RNA is generally detectable in upper respiratory specimens during the acute phase of infection. The lowest concentration of SARS-CoV-2 viral copies this assay can detect is 138 copies/mL. A negative result does not preclude SARS-Cov-2 infection and should not be used as the sole basis for treatment  or other patient management decisions. A negative result may occur with  improper specimen collection/handling, submission of specimen other than nasopharyngeal swab, presence of viral mutation(s) within the areas targeted by this assay, and inadequate number of viral copies(<138 copies/mL). A negative result must be combined with clinical observations, patient history, and epidemiological information. The expected result is Negative.  Fact Sheet for Patients:  EntrepreneurPulse.com.au  Fact Sheet for Healthcare Providers:  IncredibleEmployment.be  This test is no t yet approved or cleared by the [place] FDA and  has been authorized for detection and/or diagnosis of SARS-CoV-2 by FDA under an Emergency Use Authorization (EUA). This EUA will remain  in effect (meaning this test can be used) for the duration of the COVID-19 declaration under Section 564(b)(1) of the Act, 21 U.S.C.section 360bbb-3(b)(1), unless the authorization is terminated  or revoked sooner.       Influenza A by PCR NEGATIVE NEGATIVE Final   Influenza B by PCR NEGATIVE NEGATIVE Final    Comment: (NOTE) The Xpert Xpress SARS-CoV-2/FLU/RSV plus assay is intended as an aid in the diagnosis of influenza from Nasopharyngeal swab specimens and should not be used as a sole basis for treatment. Nasal washings and aspirates are unacceptable for Xpert Xpress SARS-CoV-2/FLU/RSV testing.  Fact Sheet for Patients: EntrepreneurPulse.com.au  Fact Sheet for Healthcare Providers: IncredibleEmployment.be  This test is not yet approved or cleared by the [place] FDA and has been authorized for detection and/or diagnosis of SARS-CoV-2 by FDA under an Emergency Use Authorization (EUA). This EUA will remain in effect (meaning this test can be used) for the duration of the COVID-19 declaration under Section 564(b)(1) of the Act, 21 U.S.C. section  360bbb-3(b)(1), unless the authorization is terminated or revoked.  Performed at [facility], [place] [address], [place], [place] [address]   Urine culture     Status: None   Collection Time: 05/08/20  2:14 PM   Specimen: In/Out Cath Urine  Result Value Ref Range Status   Specimen Description   Final  IN/OUT CATH URINE Performed at [facility], [place] [address], [place], [place] [address]    Special Requests   Final    NONE Performed at [facility], [place] [address] [place], [place] [address]    Culture   Final    NO GROWTH Performed at [facility], [place] [address], [place], [place] [address]    Report Status 05/10/2020 FINAL  Final  Blood Culture (routine x 2)     Status: None (Preliminary result)   Collection Time: 05/08/20  2:15 PM   Specimen: BLOOD RIGHT FOREARM  Result Value Ref Range Status   Specimen Description   Final    BLOOD RIGHT FOREARM Performed at [place] [address], [place], [place] [address]    Special Requests   Final    BOTTLES DRAWN AEROBIC AND ANAEROBIC Blood Culture adequate volume Performed at [facility] [address], [place], [place] [address]    Culture   Final    NO GROWTH 3 DAYS Performed at [facility], [place] [address], [place], [place] [address]    Report Status PENDING  Incomplete  Blood Culture (routine x 2)     Status: None (Preliminary result)   Collection Time: 05/08/20  2:15 PM   Specimen: BLOOD LEFT FOREARM  Result Value Ref Range Status   Specimen Description   Final    BLOOD LEFT FOREARM Performed at [facility] [address] [place], [place] [address]    Special Requests   Final    BOTTLES DRAWN AEROBIC AND ANAEROBIC Blood Culture adequate volume Performed at [place] [facility] [address], [place], [place] [address]    Culture   Final    NO GROWTH 3 DAYS Performed  at [facility], [place] [address] [place], [place] [address]    Report Status PENDING  Incomplete     Time coordinating discharge: Over 30 minutes  SIGNED:   Charlynne Cousins, MD  [facility] 05/11/2020, 9:51 AM Pager   If 7PM-7AM, please contact night-coverage [email or web address] Password [email or web address]

## 2020-05-11 NOTE — Progress Notes (Signed)
Events noted overnight and laboratory data reviewed.  His absolute neutrophil count is persistently above 500 without any evidence of sepsis.  I have no objection to discharge today.  He has follow-up on Monday, May 2 for the start of the next cycle of chemotherapy.

## 2020-05-13 LAB — CULTURE, BLOOD (ROUTINE X 2)
Culture: NO GROWTH
Culture: NO GROWTH
Special Requests: ADEQUATE
Special Requests: ADEQUATE

## 2020-05-14 ENCOUNTER — Inpatient Hospital Stay: Payer: BC Managed Care – PPO | Attending: Oncology

## 2020-05-14 ENCOUNTER — Other Ambulatory Visit: Payer: Self-pay

## 2020-05-14 ENCOUNTER — Inpatient Hospital Stay (HOSPITAL_BASED_OUTPATIENT_CLINIC_OR_DEPARTMENT_OTHER): Payer: BC Managed Care – PPO | Admitting: Oncology

## 2020-05-14 ENCOUNTER — Inpatient Hospital Stay: Payer: BC Managed Care – PPO

## 2020-05-14 ENCOUNTER — Other Ambulatory Visit: Payer: BC Managed Care – PPO

## 2020-05-14 VITALS — BP 120/87 | HR 82 | Temp 97.8°F | Resp 17 | Ht 72.0 in | Wt 204.0 lb

## 2020-05-14 DIAGNOSIS — Z5189 Encounter for other specified aftercare: Secondary | ICD-10-CM | POA: Diagnosis not present

## 2020-05-14 DIAGNOSIS — C6292 Malignant neoplasm of left testis, unspecified whether descended or undescended: Secondary | ICD-10-CM

## 2020-05-14 DIAGNOSIS — Z5111 Encounter for antineoplastic chemotherapy: Secondary | ICD-10-CM | POA: Diagnosis not present

## 2020-05-14 DIAGNOSIS — D701 Agranulocytosis secondary to cancer chemotherapy: Secondary | ICD-10-CM | POA: Diagnosis not present

## 2020-05-14 DIAGNOSIS — T451X5A Adverse effect of antineoplastic and immunosuppressive drugs, initial encounter: Secondary | ICD-10-CM | POA: Insufficient documentation

## 2020-05-14 DIAGNOSIS — Z79899 Other long term (current) drug therapy: Secondary | ICD-10-CM | POA: Diagnosis not present

## 2020-05-14 DIAGNOSIS — Z9079 Acquired absence of other genital organ(s): Secondary | ICD-10-CM | POA: Diagnosis not present

## 2020-05-14 DIAGNOSIS — Z95828 Presence of other vascular implants and grafts: Secondary | ICD-10-CM

## 2020-05-14 DIAGNOSIS — G43909 Migraine, unspecified, not intractable, without status migrainosus: Secondary | ICD-10-CM | POA: Diagnosis not present

## 2020-05-14 LAB — CMP (CANCER CENTER ONLY)
ALT: 16 U/L (ref 0–44)
AST: 18 U/L (ref 15–41)
Albumin: 3.6 g/dL (ref 3.5–5.0)
Alkaline Phosphatase: 66 U/L (ref 38–126)
Anion gap: 10 (ref 5–15)
BUN: 13 mg/dL (ref 6–20)
CO2: 26 mmol/L (ref 22–32)
Calcium: 9.2 mg/dL (ref 8.9–10.3)
Chloride: 101 mmol/L (ref 98–111)
Creatinine: 0.72 mg/dL (ref 0.61–1.24)
GFR, Estimated: >60 mL/min (ref >60)
Glucose, Bld: 88 mg/dL (ref 70–99)
Potassium: 4.3 mmol/L (ref 3.5–5.1)
Sodium: 137 mmol/L (ref 135–145)
Total Bilirubin: <0.2 mg/dL — ABNORMAL LOW (ref 0.3–1.2)
Total Protein: 7.2 g/dL (ref 6.5–8.1)

## 2020-05-14 LAB — CBC WITH DIFFERENTIAL (CANCER CENTER ONLY)
Abs Immature Granulocytes: 2.33 10*3/uL — ABNORMAL HIGH (ref 0.00–0.07)
Basophils Absolute: 0 10*3/uL (ref 0.0–0.1)
Basophils Relative: 0 %
Eosinophils Absolute: 0 10*3/uL (ref 0.0–0.5)
Eosinophils Relative: 0 %
HCT: 37.3 % — ABNORMAL LOW (ref 39.0–52.0)
Hemoglobin: 12.9 g/dL — ABNORMAL LOW (ref 13.0–17.0)
Immature Granulocytes: 30 %
Lymphocytes Relative: 17 %
Lymphs Abs: 1.4 10*3/uL (ref 0.7–4.0)
MCH: 30.4 pg (ref 26.0–34.0)
MCHC: 34.6 g/dL (ref 30.0–36.0)
MCV: 88 fL (ref 80.0–100.0)
Monocytes Absolute: 1.6 10*3/uL — ABNORMAL HIGH (ref 0.1–1.0)
Monocytes Relative: 20 %
Neutro Abs: 2.6 10*3/uL (ref 1.7–7.7)
Neutrophils Relative %: 33 %
Platelet Count: 145 10*3/uL — ABNORMAL LOW (ref 150–400)
RBC: 4.24 MIL/uL (ref 4.22–5.81)
RDW: 12.7 % (ref 11.5–15.5)
WBC Count: 7.9 10*3/uL (ref 4.0–10.5)
nRBC: 0 % (ref 0.0–0.2)

## 2020-05-14 MED ORDER — PALONOSETRON HCL INJECTION 0.25 MG/5ML
INTRAVENOUS | Status: AC
  Filled 2020-05-14: qty 5

## 2020-05-14 MED ORDER — MAGNESIUM SULFATE 2 GM/50ML IV SOLN
2.0000 g | Freq: Once | INTRAVENOUS | Status: AC
  Administered 2020-05-14: 2 g via INTRAVENOUS

## 2020-05-14 MED ORDER — POTASSIUM CHLORIDE IN NACL 20-0.9 MEQ/L-% IV SOLN
Freq: Once | INTRAVENOUS | Status: AC
Start: 2020-05-14 — End: 2020-05-14
  Filled 2020-05-14: qty 1000

## 2020-05-14 MED ORDER — SODIUM CHLORIDE 0.9% FLUSH
10.0000 mL | Freq: Once | INTRAVENOUS | Status: AC
  Administered 2020-05-14: 10 mL
  Filled 2020-05-14: qty 10

## 2020-05-14 MED ORDER — SODIUM CHLORIDE 0.9 % IV SOLN
10.0000 mg | Freq: Once | INTRAVENOUS | Status: AC
  Administered 2020-05-14: 10 mg via INTRAVENOUS
  Filled 2020-05-14: qty 10

## 2020-05-14 MED ORDER — TRAMADOL HCL 50 MG PO TABS: 100.0000 mg | ORAL_TABLET | Freq: Four times a day (QID) | ORAL | 0 refills | Status: DC | PRN

## 2020-05-14 MED ORDER — MAGNESIUM SULFATE 2 GM/50ML IV SOLN
INTRAVENOUS | Status: AC
  Filled 2020-05-14: qty 50

## 2020-05-14 MED ORDER — PALONOSETRON HCL INJECTION 0.25 MG/5ML
0.2500 mg | Freq: Once | INTRAVENOUS | Status: AC
  Administered 2020-05-14: 0.25 mg via INTRAVENOUS

## 2020-05-14 MED ORDER — SODIUM CHLORIDE 0.9 % IV SOLN
150.0000 mg | Freq: Once | INTRAVENOUS | Status: AC
  Administered 2020-05-14: 150 mg via INTRAVENOUS
  Filled 2020-05-14: qty 150

## 2020-05-14 MED ORDER — SODIUM CHLORIDE 0.9 % IV SOLN
Freq: Once | INTRAVENOUS | Status: AC
Start: 2020-05-14 — End: 2020-05-14
  Filled 2020-05-14: qty 250

## 2020-05-14 MED ORDER — HEPARIN SOD (PORK) LOCK FLUSH 100 UNIT/ML IV SOLN
500.0000 [IU] | Freq: Once | INTRAVENOUS | Status: AC | PRN
  Administered 2020-05-14: 500 [IU]
  Filled 2020-05-14: qty 5

## 2020-05-14 MED ORDER — ZOLPIDEM TARTRATE 5 MG PO TABS: 5.0000 mg | ORAL_TABLET | Freq: Every evening | ORAL | 0 refills | Status: DC | PRN

## 2020-05-14 MED ORDER — SODIUM CHLORIDE 0.9 % IV SOLN
100.0000 mg/m2 | Freq: Once | INTRAVENOUS | Status: AC
  Administered 2020-05-14: 230 mg via INTRAVENOUS
  Filled 2020-05-14: qty 11.5

## 2020-05-14 MED ORDER — SODIUM CHLORIDE 0.9% FLUSH
10.0000 mL | INTRAVENOUS | Status: DC | PRN
  Administered 2020-05-14: 10 mL
  Filled 2020-05-14: qty 10

## 2020-05-14 MED ORDER — SODIUM CHLORIDE 0.9 % IV SOLN
20.0000 mg/m2 | Freq: Once | INTRAVENOUS | Status: AC
  Administered 2020-05-14: 46 mg via INTRAVENOUS
  Filled 2020-05-14: qty 46

## 2020-05-14 NOTE — Progress Notes (Signed)
Per Dr. Grant Ruts OK to run post hydration fluids with the cisplatin.

## 2020-05-14 NOTE — Progress Notes (Signed)
Hematology and Oncology   Kevin Hess [id number] 1970/04/07 50 y.o. 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  He presented with T2N0 and subsequently developed stage IIb with pelvic adenopathy.    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      Prior Therapy:  1. Status post right radical orchiectomy with the pathology showed a 2.2 cm seminoma in August 2009.   2. He is status post adjuvant radiation therapy to the pelvis with a total of 26 Gy in 16 fractions concluded in October 2009. 3. He is status post radical orchiectomy on the left completed on Jun 10, 2019.  The final pathology showed a 2.5 cm pure seminoma that is organ confined.  Current therapy:  BEP chemotherapy started on April 23, 2020.  He is here for day 1 cycle 2 of therapy.  Interim History: [name]  presents today for a follow-up evaluation.  Since the last visit, he completed cycle 1 of chemotherapy but did require brief hospitalization after developing fever after his last bleomycin.  He did require broad-spectrum antibiotics although his cultures were negative and had a rapid clinical recovery.  Since his discharge, he feels well without any residual complaints at this time.  He denies any nausea, vomiting or abdominal pain.  He denies any residual neuropathy.    Medications: Reviewed without changes. Current Outpatient Medications  Medication Sig Dispense Refill  . acetaminophen (TYLENOL) 500 MG tablet Take 1,000 mg by mouth every 6 (six) hours as needed for moderate pain.    [name] amoxicillin-clavulanate (AUGMENTIN) 875-125 MG tablet Take 1 tablet by mouth 2 (two) times daily for 4 days. 8 tablet 0  . Bacillus Coagulans-Inulin (ALIGN PREBIOTIC-PROBIOTIC PO) Take 1 tablet by mouth daily.    . Cholecalciferol (VITAMIN D3) 5000 UNITS  CAPS Take 5,000 Units by mouth daily. Takes Monday thru friday    . cyclobenzaprine (FLEXERIL) 10 MG tablet Take 10 mg by mouth 3 (three) times daily as needed for muscle spasms. As needed    . docusate sodium (COLACE) 100 MG capsule Take 100 mg by mouth 2 (two) times daily.    [name] esomeprazole (NEXIUM) 20 MG capsule Take 20 mg by mouth at bedtime.    . fluticasone (FLONASE) 50 MCG/ACT nasal spray Place 2 sprays into both nostrils daily.    [name] lidocaine-prilocaine (EMLA) cream Apply 1 application topically as needed. (Patient taking differently: Apply 1 application topically once as needed (port access).) 30 g 3  . ondansetron (ZOFRAN ODT) 4 MG disintegrating tablet Take 1 tablet (4 mg total) by mouth every 8 (eight) hours as needed for nausea or vomiting. 20 tablet 0  . prochlorperazine (COMPAZINE) 10 MG tablet Take 1 tablet (10 mg total) by mouth every 6 (six) hours as needed for nausea or vomiting. 30 tablet 0  . rosuvastatin (CRESTOR) 10 MG tablet Take 10 mg by mouth daily.    [name] testosterone cypionate (DEPOTESTOSTERONE CYPIONATE) 200 MG/ML injection Inject 80 mg into the muscle See admin instructions. Every 9 days    . traMADol (ULTRAM) 50 MG tablet Take 2 tablets (100 mg total) by mouth every 6 (six) hours as needed for severe pain. 12 tablet 0   No current facility-administered medications for this visit.     Allergies:  Allergies  Allergen Reactions  . Penicillins     Rash  age 69    Physical exam Blood pressure 120/87, pulse 82, temperature 97.8 F (36.6 C), temperature source Tympanic, resp. rate 17, height 6' (1.829 m), weight 204 lb (92.5 kg), SpO2 98 %.    ECOG 0    General appearance: Alert, awake without any distress. Head: Atraumatic without abnormalities Oropharynx: Without any thrush or ulcers. Eyes: No scleral icterus. Lymph nodes: No lymphadenopathy noted in the cervical, supraclavicular, or axillary nodes Heart:regular rate and rhythm, without any murmurs or  gallops.   Lung: Clear to auscultation without any rhonchi, wheezes or dullness to percussion. Abdomin: Soft, nontender without any shifting dullness or ascites. Musculoskeletal: No clubbing or cyanosis. Neurological: No motor or sensory deficits. Skin: No rashes or lesions.    Lab Results: Lab Results  Component Value Date   WBC 2.0 (L) 05/11/2020   HGB 12.1 (L) 05/11/2020   HCT 35.5 (L) 05/11/2020   MCV 89.0 05/11/2020   PLT 79 (L) 05/11/2020     Chemistry      Component Value Date/Time   NA 138 05/08/2020 1414   NA 140 10/09/2016 1428   K 4.0 05/08/2020 1414   K 4.3 10/09/2016 1428   CL 104 05/08/2020 1414   CL 104 03/02/2012 1043   CO2 24 05/08/2020 1414   CO2 27 10/09/2016 1428   BUN 16 05/08/2020 1414   BUN 10.3 10/09/2016 1428   CREATININE 0.81 05/08/2020 1414   CREATININE 0.79 05/08/2020 0844   CREATININE 0.8 10/09/2016 1428      Component Value Date/Time   CALCIUM 9.1 05/08/2020 1414   CALCIUM 10.2 10/09/2016 1428   ALKPHOS 48 05/08/2020 1414   ALKPHOS 55 10/09/2016 1428   AST 17 05/08/2020 1414   AST 12 (L) 05/08/2020 0844   AST 19 10/09/2016 1428   ALT 21 05/08/2020 1414   ALT 17 05/08/2020 0844   ALT 17 10/09/2016 1428   BILITOT 0.3 05/08/2020 1414   BILITOT 0.3 05/08/2020 0844   BILITOT 0.42 10/09/2016 1428         Impression and Plan:   50 year old man with:  1.    Left testicular seminoma diagnosed in 2021 and subsequently stage IIB disease with left retroperitoneal lymph node enlargement measuring 2.8 x 2.8 cm in March 2022.    He has completed cycle 1 of BEP chemotherapy and ready to proceed with cycle 2.  Risks and benefits of proceeding with this treatment were discussed at this time.  Complication associated with this therapy include nausea, vomiting, myelosuppression as well as potential pulmonary toxicity.  The plan is to proceed without any dose reduction or delay.  He is agreeable to proceed.  2.  Right testicular seminoma: No  evidence of relapse at this time.   3.    Pulmonary toxicity screening: We will update his pulmonary function test upon completing cycle 2 of therapy.  4.    IV access: Port-A-Cath remains in place without any issues.  5.  Antiemetics: No nausea or vomiting reported at this time Compazine is available to him.  6.  Neutropenia: Growth factor support in the form of on pro on the last will be added to prevent and shorten the duration of neutropenia.  His labs from today reviewed and showed adequate recovery.  7.  Migraine headaches: He has been using Tylenol and tramadol with improvement.  8.  Follow-up: He will complete cycle 1 as scheduled and repeat visit in 3 weeks for the start of cycle 3.    30  minutes were dedicated to this visit. The time was spent on reviewing laboratory data, discussing complications related to his cancer and cancer therapy and answering questions regarding future plan.    Zola Button, MD 05/14/2020 8:33 AM

## 2020-05-14 NOTE — Patient Instructions (Signed)
Implanted Port Insertion, Care After This sheet gives you information about how to care for yourself after your procedure. Your health care provider may also give you more specific instructions. If you have problems or questions, contact your health care provider. What can I expect after the procedure? After the procedure, it is common to have:  Discomfort at the port insertion site.  Bruising on the skin over the port. This should improve over 3-4 days. Follow these instructions at home: Port care  After your port is placed, you will get a manufacturer's information card. The card has information about your port. Keep this card with you at all times.  Take care of the port as told by your health care provider. Ask your health care provider if you or a family member can get training for taking care of the port at home. A home health care nurse may also take care of the port.  Make sure to remember what type of port you have. Incision care  Follow instructions from your health care provider about how to take care of your port insertion site. Make sure you: ? Wash your hands with soap and water before and after you change your bandage (dressing). If soap and water are not available, use hand sanitizer. ? Change your dressing as told by your health care provider. ? Leave stitches (sutures), skin glue, or adhesive strips in place. These skin closures may need to stay in place for 2 weeks or longer. If adhesive strip edges start to loosen and curl up, you may trim the loose edges. Do not remove adhesive strips completely unless your health care provider tells you to do that.  Check your port insertion site every day for signs of infection. Check for: ? Redness, swelling, or pain. ? Fluid or blood. ? Warmth. ? Pus or a bad smell.      Activity  Return to your normal activities as told by your health care provider. Ask your health care provider what activities are safe for you.  Do not  lift anything that is heavier than 10 lb (4.5 kg), or the limit that you are told, until your health care provider says that it is safe. General instructions  Take over-the-counter and prescription medicines only as told by your health care provider.  Do not take baths, swim, or use a hot tub until your health care provider approves. Ask your health care provider if you may take showers. You may only be allowed to take sponge baths.  Do not drive for 24 hours if you were given a sedative during your procedure.  Wear a medical alert bracelet in case of an emergency. This will tell any health care providers that you have a port.  Keep all follow-up visits as told by your health care provider. This is important. Contact a health care provider if:  You cannot flush your port with saline as directed, or you cannot draw blood from the port.  You have a fever or chills.  You have redness, swelling, or pain around your port insertion site.  You have fluid or blood coming from your port insertion site.  Your port insertion site feels warm to the touch.  You have pus or a bad smell coming from the port insertion site. Get help right away if:  You have chest pain or shortness of breath.  You have bleeding from your port that you cannot control. Summary  Take care of the port as told by your   health care provider. Keep the manufacturer's information card with you at all times.  Change your dressing as told by your health care provider.  Contact a health care provider if you have a fever or chills or if you have redness, swelling, or pain around your port insertion site.  Keep all follow-up visits as told by your health care provider. This information is not intended to replace advice given to you by your health care provider. Make sure you discuss any questions you have with your health care provider. Document Revised: 07/28/2017 Document Reviewed: 07/28/2017 Elsevier Patient Education   2021 Elsevier Inc.  

## 2020-05-14 NOTE — Patient Instructions (Signed)
[facility] Discharge Instructions for Patients Receiving Chemotherapy  Today you received the following chemotherapy agents etoposide, cisplatin  To help prevent nausea and vomiting after your treatment, we encourage you to take your nausea medication as directed.   If you develop nausea and vomiting that is not controlled by your nausea medication, call the clinic.   BELOW ARE SYMPTOMS THAT SHOULD BE REPORTED IMMEDIATELY:  *FEVER GREATER THAN 100.5 F  *CHILLS WITH OR WITHOUT FEVER  NAUSEA AND VOMITING THAT IS NOT CONTROLLED WITH YOUR NAUSEA MEDICATION  *UNUSUAL SHORTNESS OF BREATH  *UNUSUAL BRUISING OR BLEEDING  TENDERNESS IN MOUTH AND THROAT WITH OR WITHOUT PRESENCE OF ULCERS  *URINARY PROBLEMS  *BOWEL PROBLEMS  UNUSUAL RASH Items with * indicate a potential emergency and should be followed up as soon as possible.  Feel free to call the clinic should you have any questions or concerns. The clinic phone number is [phone].  Please show the CHEMO ALERT CARD at check-in to the Emergency Department and triage nurse.   

## 2020-05-15 ENCOUNTER — Inpatient Hospital Stay: Payer: BC Managed Care – PPO

## 2020-05-15 VITALS — BP 120/79 | HR 90 | Temp 98.6°F | Resp 16

## 2020-05-15 DIAGNOSIS — Z5111 Encounter for antineoplastic chemotherapy: Secondary | ICD-10-CM | POA: Diagnosis not present

## 2020-05-15 DIAGNOSIS — Z9079 Acquired absence of other genital organ(s): Secondary | ICD-10-CM | POA: Diagnosis not present

## 2020-05-15 DIAGNOSIS — D701 Agranulocytosis secondary to cancer chemotherapy: Secondary | ICD-10-CM | POA: Diagnosis not present

## 2020-05-15 DIAGNOSIS — Z5189 Encounter for other specified aftercare: Secondary | ICD-10-CM | POA: Diagnosis not present

## 2020-05-15 DIAGNOSIS — G43909 Migraine, unspecified, not intractable, without status migrainosus: Secondary | ICD-10-CM | POA: Diagnosis not present

## 2020-05-15 DIAGNOSIS — C6292 Malignant neoplasm of left testis, unspecified whether descended or undescended: Secondary | ICD-10-CM | POA: Diagnosis not present

## 2020-05-15 DIAGNOSIS — Z79899 Other long term (current) drug therapy: Secondary | ICD-10-CM | POA: Diagnosis not present

## 2020-05-15 DIAGNOSIS — T451X5A Adverse effect of antineoplastic and immunosuppressive drugs, initial encounter: Secondary | ICD-10-CM | POA: Diagnosis not present

## 2020-05-15 MED ORDER — MAGNESIUM SULFATE 2 GM/50ML IV SOLN
INTRAVENOUS | Status: AC
  Filled 2020-05-15: qty 50

## 2020-05-15 MED ORDER — MAGNESIUM SULFATE 2 GM/50ML IV SOLN
2.0000 g | Freq: Once | INTRAVENOUS | Status: AC
Start: 2020-05-15 — End: 2020-05-15
  Administered 2020-05-15: 2 g via INTRAVENOUS

## 2020-05-15 MED ORDER — SODIUM CHLORIDE 0.9 % IV SOLN
30.0000 [IU] | Freq: Once | INTRAVENOUS | Status: AC
Start: 2020-05-15 — End: 2020-05-15
  Administered 2020-05-15: 30 [IU] via INTRAVENOUS
  Filled 2020-05-15: qty 10

## 2020-05-15 MED ORDER — SODIUM CHLORIDE 0.9 % IV SOLN
Freq: Once | INTRAVENOUS | Status: AC
  Filled 2020-05-15: qty 250

## 2020-05-15 MED ORDER — SODIUM CHLORIDE 0.9% FLUSH
10.0000 mL | INTRAVENOUS | Status: DC | PRN
Start: 2020-05-15 — End: 2020-05-15
  Administered 2020-05-15: 10 mL
  Filled 2020-05-15: qty 10

## 2020-05-15 MED ORDER — SODIUM CHLORIDE 0.9 % IV SOLN
100.0000 mg/m2 | Freq: Once | INTRAVENOUS | Status: AC
  Administered 2020-05-15: 230 mg via INTRAVENOUS
  Filled 2020-05-15: qty 11.5

## 2020-05-15 MED ORDER — DEXAMETHASONE SODIUM PHOSPHATE 100 MG/10ML IJ SOLN
10.0000 mg | Freq: Once | INTRAMUSCULAR | Status: AC
  Administered 2020-05-15: 10 mg via INTRAVENOUS
  Filled 2020-05-15: qty 10

## 2020-05-15 MED ORDER — POTASSIUM CHLORIDE IN NACL 20-0.9 MEQ/L-% IV SOLN
Freq: Once | INTRAVENOUS | Status: AC
Start: 2020-05-15 — End: 2020-05-15
  Filled 2020-05-15: qty 1000

## 2020-05-15 MED ORDER — CISPLATIN CHEMO INJECTION 100MG/100ML
20.0000 mg/m2 | Freq: Once | INTRAVENOUS | Status: AC
  Administered 2020-05-15: 46 mg via INTRAVENOUS
  Filled 2020-05-15: qty 46

## 2020-05-15 MED ORDER — HEPARIN SOD (PORK) LOCK FLUSH 100 UNIT/ML IV SOLN
500.0000 [IU] | Freq: Once | INTRAVENOUS | Status: AC | PRN
Start: 2020-05-15 — End: 2020-05-15
  Administered 2020-05-15: 500 [IU]
  Filled 2020-05-15: qty 5

## 2020-05-15 NOTE — Patient Instructions (Signed)
[facility] MEDICAL ONCOLOGY   Discharge Instructions: Thank you for choosing [facility] to provide your oncology and hematology care.   If you have a lab appointment with the Cancer Center, please go directly to the Cancer Center and check in at the registration area.   Wear comfortable clothing and clothing appropriate for easy access to any Portacath or PICC line.   We strive to give you quality time with your provider. You may need to reschedule your appointment if you arrive late (15 or more minutes).  Arriving late affects you and other patients whose appointments are after yours.  Also, if you miss three or more appointments without notifying the office, you may be dismissed from the clinic at the provider's discretion.      For prescription refill requests, have your pharmacy contact our office and allow 72 hours for refills to be completed.    Today you received the following chemotherapy and/or immunotherapy agents: Bleomycin, Cisplatin, and Etoposide     To help prevent nausea and vomiting after your treatment, we encourage you to take your nausea medication as directed.  BELOW ARE SYMPTOMS THAT SHOULD BE REPORTED IMMEDIATELY: . *FEVER GREATER THAN 100.4 F (38 C) OR HIGHER . *CHILLS OR SWEATING . *NAUSEA AND VOMITING THAT IS NOT CONTROLLED WITH YOUR NAUSEA MEDICATION . *UNUSUAL SHORTNESS OF BREATH . *UNUSUAL BRUISING OR BLEEDING . *URINARY PROBLEMS (pain or burning when urinating, or frequent urination) . *BOWEL PROBLEMS (unusual diarrhea, constipation, pain near the anus) . TENDERNESS IN MOUTH AND THROAT WITH OR WITHOUT PRESENCE OF ULCERS (sore throat, sores in mouth, or a toothache) . UNUSUAL RASH, SWELLING OR PAIN  . UNUSUAL VAGINAL DISCHARGE OR ITCHING   Items with * indicate a potential emergency and should be followed up as soon as possible or go to the Emergency Department if any problems should occur.  Please show the CHEMOTHERAPY ALERT  CARD or IMMUNOTHERAPY ALERT CARD at check-in to the Emergency Department and triage nurse.  Should you have questions after your visit or need to cancel or reschedule your appointment, please contact [facility]  Dept: [phone]  and follow the prompts.  Office hours are 8:00 a.m. to 4:30 p.m. Monday - Friday. Please note that voicemails left after 4:00 p.m. may not be returned until the following business day.  We are closed weekends and major holidays. You have access to a nurse at all times for urgent questions. Please call the main number to the clinic Dept: [phone] and follow the prompts.   For any non-urgent questions, you may also contact your provider using MyChart. We now offer e-Visits for anyone 18 and older to request care online for non-urgent symptoms. For details visit [email or web address].   Also download the MyChart app! Go to the app store, search "MyChart", open the app, select [facility], and log in with your MyChart username and password.  Due to Covid, a mask is required upon entering the hospital/clinic. If you do not have a mask, one will be given to you upon arrival. For doctor visits, patients may have 1 support person aged 18 or older with them. For treatment visits, patients cannot have anyone with them due to current Covid guidelines and our immunocompromised population.   

## 2020-05-16 ENCOUNTER — Inpatient Hospital Stay: Payer: BC Managed Care – PPO

## 2020-05-16 ENCOUNTER — Other Ambulatory Visit: Payer: Self-pay

## 2020-05-16 VITALS — BP 118/78 | HR 66 | Temp 98.5°F | Resp 16 | Wt 204.0 lb

## 2020-05-16 DIAGNOSIS — C6292 Malignant neoplasm of left testis, unspecified whether descended or undescended: Secondary | ICD-10-CM | POA: Diagnosis not present

## 2020-05-16 DIAGNOSIS — T451X5A Adverse effect of antineoplastic and immunosuppressive drugs, initial encounter: Secondary | ICD-10-CM | POA: Diagnosis not present

## 2020-05-16 DIAGNOSIS — D701 Agranulocytosis secondary to cancer chemotherapy: Secondary | ICD-10-CM | POA: Diagnosis not present

## 2020-05-16 DIAGNOSIS — Z79899 Other long term (current) drug therapy: Secondary | ICD-10-CM | POA: Diagnosis not present

## 2020-05-16 DIAGNOSIS — Z5189 Encounter for other specified aftercare: Secondary | ICD-10-CM | POA: Diagnosis not present

## 2020-05-16 DIAGNOSIS — G43909 Migraine, unspecified, not intractable, without status migrainosus: Secondary | ICD-10-CM | POA: Diagnosis not present

## 2020-05-16 DIAGNOSIS — Z5111 Encounter for antineoplastic chemotherapy: Secondary | ICD-10-CM | POA: Diagnosis not present

## 2020-05-16 DIAGNOSIS — Z9079 Acquired absence of other genital organ(s): Secondary | ICD-10-CM | POA: Diagnosis not present

## 2020-05-16 MED ORDER — HEPARIN SOD (PORK) LOCK FLUSH 100 UNIT/ML IV SOLN
500.0000 [IU] | Freq: Once | INTRAVENOUS | Status: AC | PRN
  Administered 2020-05-16: 500 [IU]
  Filled 2020-05-16: qty 5

## 2020-05-16 MED ORDER — FOSAPREPITANT DIMEGLUMINE INJECTION 150 MG
150.0000 mg | Freq: Once | INTRAVENOUS | Status: AC
  Administered 2020-05-16: 150 mg via INTRAVENOUS
  Filled 2020-05-16: qty 150

## 2020-05-16 MED ORDER — PALONOSETRON HCL INJECTION 0.25 MG/5ML
0.2500 mg | Freq: Once | INTRAVENOUS | Status: AC
  Administered 2020-05-16: 0.25 mg via INTRAVENOUS

## 2020-05-16 MED ORDER — SODIUM CHLORIDE 0.9 % IV SOLN
100.0000 mg/m2 | Freq: Once | INTRAVENOUS | Status: AC
  Administered 2020-05-16: 230 mg via INTRAVENOUS
  Filled 2020-05-16: qty 11.5

## 2020-05-16 MED ORDER — SODIUM CHLORIDE 0.9 % IV SOLN
10.0000 mg | Freq: Once | INTRAVENOUS | Status: AC
  Administered 2020-05-16: 10 mg via INTRAVENOUS
  Filled 2020-05-16: qty 10

## 2020-05-16 MED ORDER — SODIUM CHLORIDE 0.9% FLUSH
10.0000 mL | INTRAVENOUS | Status: DC | PRN
  Administered 2020-05-16: 10 mL
  Filled 2020-05-16: qty 10

## 2020-05-16 MED ORDER — PALONOSETRON HCL INJECTION 0.25 MG/5ML
INTRAVENOUS | Status: AC
  Filled 2020-05-16: qty 5

## 2020-05-16 MED ORDER — POTASSIUM CHLORIDE IN NACL 20-0.9 MEQ/L-% IV SOLN
Freq: Once | INTRAVENOUS | Status: AC
Start: 2020-05-16 — End: 2020-05-16
  Filled 2020-05-16: qty 1000

## 2020-05-16 MED ORDER — SODIUM CHLORIDE 0.9 % IV SOLN
20.0000 mg/m2 | Freq: Once | INTRAVENOUS | Status: AC
  Administered 2020-05-16: 46 mg via INTRAVENOUS
  Filled 2020-05-16: qty 46

## 2020-05-16 MED ORDER — SODIUM CHLORIDE 0.9 % IV SOLN
Freq: Once | INTRAVENOUS | Status: AC
  Filled 2020-05-16: qty 250

## 2020-05-16 NOTE — Patient Instructions (Signed)
[facility]COL[facility]  Discharge Instructions: Thank you for choosing [facility] to provide your oncology and hematology care.   If you have a lab appointment with the Cancer Center, please go directly to the Cancer Center and check in at the registration area.   Wear comfortable clothing and clothing appropriate for easy access to any Portacath or PICC line.   We strive to give you quality time with your provider. You may need to reschedule your appointment if you arrive late (15 or more minutes).  Arriving late affects you and other patients whose appointments are after yours.  Also, if you miss three or more appointments without notifying the office, you may be dismissed from the clinic at the provider's discretion.      For prescription refill requests, have your pharmacy contact our office and allow 72 hours for refills to be completed.    Today you received the following chemotherapy and/or immunotherapy agents: Cisplatin & Etoposide    To help prevent nausea and vomiting after your treatment, we encourage you to take your nausea medication as directed.  BELOW ARE SYMPTOMS THAT SHOULD BE REPORTED IMMEDIATELY: *FEVER GREATER THAN 100.4 F (38 C) OR HIGHER *CHILLS OR SWEATING *NAUSEA AND VOMITING THAT IS NOT CONTROLLED WITH YOUR NAUSEA MEDICATION *UNUSUAL SHORTNESS OF BREATH *UNUSUAL BRUISING OR BLEEDING *URINARY PROBLEMS (pain or burning when urinating, or frequent urination) *BOWEL PROBLEMS (unusual diarrhea, constipation, pain near the anus) TENDERNESS IN MOUTH AND THROAT WITH OR WITHOUT PRESENCE OF ULCERS (sore throat, sores in mouth, or a toothache) UNUSUAL RASH, SWELLING OR PAIN  UNUSUAL VAGINAL DISCHARGE OR ITCHING   Items with * indicate a potential emergency and should be followed up as soon as possible or go to the Emergency Department if any problems should occur.  Please show the CHEMOTHERAPY ALERT CARD or IMMUNOTHERAPY ALERT CARD at  check-in to the Emergency Department and triage nurse.  Should you have questions after your visit or need to cancel or reschedule your appointment, please contact [facility]  Dept: [phone]  and follow the prompts.  Office hours are 8:00 a.m. to 4:30 p.m. Monday - Friday. Please note that voicemails left after 4:00 p.m. may not be returned until the following business day.  We are closed weekends and major holidays. You have access to a nurse at all times for urgent questions. Please call the main number to the clinic Dept: [phone] and follow the prompts.   For any non-urgent questions, you may also contact your provider using MyChart. We now offer e-Visits for anyone 18 and older to request care online for non-urgent symptoms. For details visit [email or web address].   Also download the MyChart app! Go to the app store, search "MyChart", open the app, select [facility], and log in with your MyChart username and password.  Due to Covid, a mask is required upon entering the hospital/clinic. If you do not have a mask, one will be given to you upon arrival. For doctor visits, patients may have 1 support person aged 18 or older with them. For treatment visits, patients cannot have anyone with them due to current Covid guidelines and our immunocompromised population.   

## 2020-05-17 ENCOUNTER — Inpatient Hospital Stay: Payer: BC Managed Care – PPO

## 2020-05-17 VITALS — BP 128/86 | HR 93 | Temp 98.4°F | Resp 16 | Wt 207.0 lb

## 2020-05-17 DIAGNOSIS — C6292 Malignant neoplasm of left testis, unspecified whether descended or undescended: Secondary | ICD-10-CM | POA: Diagnosis not present

## 2020-05-17 DIAGNOSIS — D701 Agranulocytosis secondary to cancer chemotherapy: Secondary | ICD-10-CM | POA: Diagnosis not present

## 2020-05-17 DIAGNOSIS — Z79899 Other long term (current) drug therapy: Secondary | ICD-10-CM | POA: Diagnosis not present

## 2020-05-17 DIAGNOSIS — Z9079 Acquired absence of other genital organ(s): Secondary | ICD-10-CM | POA: Diagnosis not present

## 2020-05-17 DIAGNOSIS — T451X5A Adverse effect of antineoplastic and immunosuppressive drugs, initial encounter: Secondary | ICD-10-CM | POA: Diagnosis not present

## 2020-05-17 DIAGNOSIS — Z5189 Encounter for other specified aftercare: Secondary | ICD-10-CM | POA: Diagnosis not present

## 2020-05-17 DIAGNOSIS — Z5111 Encounter for antineoplastic chemotherapy: Secondary | ICD-10-CM | POA: Diagnosis not present

## 2020-05-17 DIAGNOSIS — G43909 Migraine, unspecified, not intractable, without status migrainosus: Secondary | ICD-10-CM | POA: Diagnosis not present

## 2020-05-17 MED ORDER — SODIUM CHLORIDE 0.9 % IV SOLN
10.0000 mg | Freq: Once | INTRAVENOUS | Status: AC
  Administered 2020-05-17: 10 mg via INTRAVENOUS
  Filled 2020-05-17: qty 10

## 2020-05-17 MED ORDER — MAGNESIUM SULFATE 2 GM/50ML IV SOLN
INTRAVENOUS | Status: AC
  Filled 2020-05-17: qty 50

## 2020-05-17 MED ORDER — SODIUM CHLORIDE 0.9 % IV SOLN
Freq: Once | INTRAVENOUS | Status: AC
  Filled 2020-05-17: qty 250

## 2020-05-17 MED ORDER — POTASSIUM CHLORIDE IN NACL 20-0.9 MEQ/L-% IV SOLN
Freq: Once | INTRAVENOUS | Status: AC
  Filled 2020-05-17: qty 1000

## 2020-05-17 MED ORDER — SODIUM CHLORIDE 0.9% FLUSH
10.0000 mL | INTRAVENOUS | Status: DC | PRN
Start: 2020-05-17 — End: 2020-05-17
  Filled 2020-05-17: qty 10

## 2020-05-17 MED ORDER — HEPARIN SOD (PORK) LOCK FLUSH 100 UNIT/ML IV SOLN
500.0000 [IU] | Freq: Once | INTRAVENOUS | Status: DC | PRN
  Filled 2020-05-17: qty 5

## 2020-05-17 MED ORDER — SODIUM CHLORIDE 0.9 % IV SOLN
20.0000 mg/m2 | Freq: Once | INTRAVENOUS | Status: AC
  Administered 2020-05-17: 46 mg via INTRAVENOUS
  Filled 2020-05-17: qty 46

## 2020-05-17 MED ORDER — MAGNESIUM SULFATE 2 GM/50ML IV SOLN
2.0000 g | Freq: Once | INTRAVENOUS | Status: AC
  Administered 2020-05-17: 2 g via INTRAVENOUS

## 2020-05-17 MED ORDER — SODIUM CHLORIDE 0.9 % IV SOLN
100.0000 mg/m2 | Freq: Once | INTRAVENOUS | Status: AC
  Administered 2020-05-17: 230 mg via INTRAVENOUS
  Filled 2020-05-17: qty 11.5

## 2020-05-17 NOTE — Patient Instructions (Signed)
[facility]COL[facility]  Discharge Instructions: Thank you for choosing [facility] to provide your oncology and hematology care.   If you have a lab appointment with the Cancer Center, please go directly to the Cancer Center and check in at the registration area.   Wear comfortable clothing and clothing appropriate for easy access to any Portacath or PICC line.   We strive to give you quality time with your provider. You may need to reschedule your appointment if you arrive late (15 or more minutes).  Arriving late affects you and other patients whose appointments are after yours.  Also, if you miss three or more appointments without notifying the office, you may be dismissed from the clinic at the provider's discretion.      For prescription refill requests, have your pharmacy contact our office and allow 72 hours for refills to be completed.    Today you received the following chemotherapy and/or immunotherapy agents: Cisplatin & Etoposide    To help prevent nausea and vomiting after your treatment, we encourage you to take your nausea medication as directed.  BELOW ARE SYMPTOMS THAT SHOULD BE REPORTED IMMEDIATELY: *FEVER GREATER THAN 100.4 F (38 C) OR HIGHER *CHILLS OR SWEATING *NAUSEA AND VOMITING THAT IS NOT CONTROLLED WITH YOUR NAUSEA MEDICATION *UNUSUAL SHORTNESS OF BREATH *UNUSUAL BRUISING OR BLEEDING *URINARY PROBLEMS (pain or burning when urinating, or frequent urination) *BOWEL PROBLEMS (unusual diarrhea, constipation, pain near the anus) TENDERNESS IN MOUTH AND THROAT WITH OR WITHOUT PRESENCE OF ULCERS (sore throat, sores in mouth, or a toothache) UNUSUAL RASH, SWELLING OR PAIN  UNUSUAL VAGINAL DISCHARGE OR ITCHING   Items with * indicate a potential emergency and should be followed up as soon as possible or go to the Emergency Department if any problems should occur.  Please show the CHEMOTHERAPY ALERT CARD or IMMUNOTHERAPY ALERT CARD at  check-in to the Emergency Department and triage nurse.  Should you have questions after your visit or need to cancel or reschedule your appointment, please contact [facility]  Dept: [phone]  and follow the prompts.  Office hours are 8:00 a.m. to 4:30 p.m. Monday - Friday. Please note that voicemails left after 4:00 p.m. may not be returned until the following business day.  We are closed weekends and major holidays. You have access to a nurse at all times for urgent questions. Please call the main number to the clinic Dept: [phone] and follow the prompts.   For any non-urgent questions, you may also contact your provider using MyChart. We now offer e-Visits for anyone 18 and older to request care online for non-urgent symptoms. For details visit [email or web address].   Also download the MyChart app! Go to the app store, search "MyChart", open the app, select , and log in with your MyChart username and password.  Due to Covid, a mask is required upon entering the hospital/clinic. If you do not have a mask, one will be given to you upon arrival. For doctor visits, patients may have 1 support person aged 18 or older with them. For treatment visits, patients cannot have anyone with them due to current Covid guidelines and our immunocompromised population.   

## 2020-05-18 ENCOUNTER — Inpatient Hospital Stay: Payer: BC Managed Care – PPO

## 2020-05-18 ENCOUNTER — Other Ambulatory Visit: Payer: Self-pay

## 2020-05-18 VITALS — BP 130/85 | HR 75 | Temp 97.9°F | Resp 16

## 2020-05-18 DIAGNOSIS — Z9079 Acquired absence of other genital organ(s): Secondary | ICD-10-CM | POA: Diagnosis not present

## 2020-05-18 DIAGNOSIS — Z79899 Other long term (current) drug therapy: Secondary | ICD-10-CM | POA: Diagnosis not present

## 2020-05-18 DIAGNOSIS — C6292 Malignant neoplasm of left testis, unspecified whether descended or undescended: Secondary | ICD-10-CM

## 2020-05-18 DIAGNOSIS — Z5111 Encounter for antineoplastic chemotherapy: Secondary | ICD-10-CM | POA: Diagnosis not present

## 2020-05-18 DIAGNOSIS — T451X5A Adverse effect of antineoplastic and immunosuppressive drugs, initial encounter: Secondary | ICD-10-CM | POA: Diagnosis not present

## 2020-05-18 DIAGNOSIS — D701 Agranulocytosis secondary to cancer chemotherapy: Secondary | ICD-10-CM | POA: Diagnosis not present

## 2020-05-18 DIAGNOSIS — G43909 Migraine, unspecified, not intractable, without status migrainosus: Secondary | ICD-10-CM | POA: Diagnosis not present

## 2020-05-18 DIAGNOSIS — Z5189 Encounter for other specified aftercare: Secondary | ICD-10-CM | POA: Diagnosis not present

## 2020-05-18 MED ORDER — SODIUM CHLORIDE 0.9 % IV SOLN
Freq: Once | INTRAVENOUS | Status: AC
Start: 2020-05-18 — End: 2020-05-18
  Filled 2020-05-18: qty 250

## 2020-05-18 MED ORDER — MAGNESIUM SULFATE 2 GM/50ML IV SOLN
2.0000 g | Freq: Once | INTRAVENOUS | Status: AC
  Administered 2020-05-18: 2 g via INTRAVENOUS

## 2020-05-18 MED ORDER — PEGFILGRASTIM 6 MG/0.6ML ~~LOC~~ PSKT
PREFILLED_SYRINGE | SUBCUTANEOUS | Status: AC
  Filled 2020-05-18: qty 0.6

## 2020-05-18 MED ORDER — HEPARIN SOD (PORK) LOCK FLUSH 100 UNIT/ML IV SOLN
500.0000 [IU] | Freq: Once | INTRAVENOUS | Status: AC | PRN
  Administered 2020-05-18: 500 [IU]
  Filled 2020-05-18: qty 5

## 2020-05-18 MED ORDER — SODIUM CHLORIDE 0.9 % IV SOLN
100.0000 mg/m2 | Freq: Once | INTRAVENOUS | Status: AC
  Administered 2020-05-18: 230 mg via INTRAVENOUS
  Filled 2020-05-18: qty 11.5

## 2020-05-18 MED ORDER — PEGFILGRASTIM 6 MG/0.6ML ~~LOC~~ PSKT
6.0000 mg | PREFILLED_SYRINGE | Freq: Once | SUBCUTANEOUS | Status: AC
  Administered 2020-05-18: 6 mg via SUBCUTANEOUS

## 2020-05-18 MED ORDER — SODIUM CHLORIDE 0.9% FLUSH
10.0000 mL | INTRAVENOUS | Status: DC | PRN
Start: 2020-05-18 — End: 2020-05-18
  Administered 2020-05-18: 10 mL
  Filled 2020-05-18: qty 10

## 2020-05-18 MED ORDER — SODIUM CHLORIDE 0.9 % IV SOLN
10.0000 mg | Freq: Once | INTRAVENOUS | Status: AC
  Administered 2020-05-18: 10 mg via INTRAVENOUS
  Filled 2020-05-18: qty 10

## 2020-05-18 MED ORDER — MAGNESIUM SULFATE 2 GM/50ML IV SOLN
INTRAVENOUS | Status: AC
  Filled 2020-05-18: qty 50

## 2020-05-18 MED ORDER — PALONOSETRON HCL INJECTION 0.25 MG/5ML
INTRAVENOUS | Status: AC
  Filled 2020-05-18: qty 5

## 2020-05-18 MED ORDER — PALONOSETRON HCL INJECTION 0.25 MG/5ML
0.2500 mg | Freq: Once | INTRAVENOUS | Status: AC
  Administered 2020-05-18: 0.25 mg via INTRAVENOUS

## 2020-05-18 MED ORDER — POTASSIUM CHLORIDE IN NACL 20-0.9 MEQ/L-% IV SOLN
Freq: Once | INTRAVENOUS | Status: AC
Start: 2020-05-18 — End: 2020-05-18
  Filled 2020-05-18: qty 1000

## 2020-05-18 MED ORDER — SODIUM CHLORIDE 0.9 % IV SOLN
150.0000 mg | Freq: Once | INTRAVENOUS | Status: AC
  Administered 2020-05-18: 150 mg via INTRAVENOUS
  Filled 2020-05-18: qty 150

## 2020-05-18 MED ORDER — SODIUM CHLORIDE 0.9 % IV SOLN
20.0000 mg/m2 | Freq: Once | INTRAVENOUS | Status: AC
  Administered 2020-05-18: 46 mg via INTRAVENOUS
  Filled 2020-05-18: qty 46

## 2020-05-18 NOTE — Patient Instructions (Signed)
[facility]  Discharge Instructions: Thank you for choosing [facility] to provide your oncology and hematology care.   If you have a lab appointment with the Cancer Center, please go directly to the Cancer Center and check in at the registration area.   Wear comfortable clothing and clothing appropriate for easy access to any Portacath or PICC line.   We strive to give you quality time with your provider. You may need to reschedule your appointment if you arrive late (15 or more minutes).  Arriving late affects you and other patients whose appointments are after yours.  Also, if you miss three or more appointments without notifying the office, you may be dismissed from the clinic at the provider's discretion.      For prescription refill requests, have your pharmacy contact our office and allow 72 hours for refills to be completed.    Today you received the following chemotherapy and/or immunotherapy agents: Cisplatin & Etoposide    To help prevent nausea and vomiting after your treatment, we encourage you to take your nausea medication as directed.  BELOW ARE SYMPTOMS THAT SHOULD BE REPORTED IMMEDIATELY: *FEVER GREATER THAN 100.4 F (38 C) OR HIGHER *CHILLS OR SWEATING *NAUSEA AND VOMITING THAT IS NOT CONTROLLED WITH YOUR NAUSEA MEDICATION *UNUSUAL SHORTNESS OF BREATH *UNUSUAL BRUISING OR BLEEDING *URINARY PROBLEMS (pain or burning when urinating, or frequent urination) *BOWEL PROBLEMS (unusual diarrhea, constipation, pain near the anus) TENDERNESS IN MOUTH AND THROAT WITH OR WITHOUT PRESENCE OF ULCERS (sore throat, sores in mouth, or a toothache) UNUSUAL RASH, SWELLING OR PAIN  UNUSUAL VAGINAL DISCHARGE OR ITCHING   Items with * indicate a potential emergency and should be followed up as soon as possible or go to the Emergency Department if any problems should occur.  Please show the CHEMOTHERAPY ALERT CARD or IMMUNOTHERAPY ALERT CARD at  check-in to the Emergency Department and triage nurse.  Should you have questions after your visit or need to cancel or reschedule your appointment, please contact [facility]  Dept: [phone]  and follow the prompts.  Office hours are 8:00 a.m. to 4:30 p.m. Monday - Friday. Please note that voicemails left after 4:00 p.m. may not be returned until the following business day.  We are closed weekends and major holidays. You have access to a nurse at all times for urgent questions. Please call the main number to the clinic Dept: [phone] and follow the prompts.   For any non-urgent questions, you may also contact your provider using MyChart. We now offer e-Visits for anyone 18 and older to request care online for non-urgent symptoms. For details visit [email or web address].   Also download the MyChart app! Go to the app store, search "MyChart", open the app, select [facility], and log in with your MyChart username and password.  Due to Covid, a mask is required upon entering the hospital/clinic. If you do not have a mask, one will be given to you upon arrival. For doctor visits, patients may have 1 support person aged 18 or older with them. For treatment visits, patients cannot have anyone with them due to current Covid guidelines and our immunocompromised population.   

## 2020-05-21 ENCOUNTER — Encounter: Payer: Self-pay | Admitting: Oncology

## 2020-05-22 ENCOUNTER — Other Ambulatory Visit: Payer: Self-pay

## 2020-05-22 ENCOUNTER — Other Ambulatory Visit: Payer: Self-pay | Admitting: *Deleted

## 2020-05-22 ENCOUNTER — Other Ambulatory Visit: Payer: BC Managed Care – PPO

## 2020-05-22 ENCOUNTER — Inpatient Hospital Stay: Payer: BC Managed Care – PPO

## 2020-05-22 VITALS — BP 130/75 | HR 96 | Temp 98.2°F | Resp 17

## 2020-05-22 DIAGNOSIS — T451X5A Adverse effect of antineoplastic and immunosuppressive drugs, initial encounter: Secondary | ICD-10-CM | POA: Diagnosis not present

## 2020-05-22 DIAGNOSIS — G43909 Migraine, unspecified, not intractable, without status migrainosus: Secondary | ICD-10-CM | POA: Diagnosis not present

## 2020-05-22 DIAGNOSIS — Z9079 Acquired absence of other genital organ(s): Secondary | ICD-10-CM | POA: Diagnosis not present

## 2020-05-22 DIAGNOSIS — D701 Agranulocytosis secondary to cancer chemotherapy: Secondary | ICD-10-CM | POA: Diagnosis not present

## 2020-05-22 DIAGNOSIS — Z5111 Encounter for antineoplastic chemotherapy: Secondary | ICD-10-CM | POA: Diagnosis not present

## 2020-05-22 DIAGNOSIS — Z95828 Presence of other vascular implants and grafts: Secondary | ICD-10-CM

## 2020-05-22 DIAGNOSIS — Z5189 Encounter for other specified aftercare: Secondary | ICD-10-CM | POA: Diagnosis not present

## 2020-05-22 DIAGNOSIS — C6292 Malignant neoplasm of left testis, unspecified whether descended or undescended: Secondary | ICD-10-CM

## 2020-05-22 DIAGNOSIS — Z79899 Other long term (current) drug therapy: Secondary | ICD-10-CM | POA: Diagnosis not present

## 2020-05-22 LAB — CBC WITH DIFFERENTIAL (CANCER CENTER ONLY)
Abs Immature Granulocytes: 1.13 10*3/uL — ABNORMAL HIGH (ref 0.00–0.07)
Basophils Absolute: 0.1 10*3/uL (ref 0.0–0.1)
Basophils Relative: 1 %
Eosinophils Absolute: 0 10*3/uL (ref 0.0–0.5)
Eosinophils Relative: 0 %
HCT: 35.1 % — ABNORMAL LOW (ref 39.0–52.0)
Hemoglobin: 12.1 g/dL — ABNORMAL LOW (ref 13.0–17.0)
Immature Granulocytes: 11 %
Lymphocytes Relative: 6 %
Lymphs Abs: 0.6 10*3/uL — ABNORMAL LOW (ref 0.7–4.0)
MCH: 30.3 pg (ref 26.0–34.0)
MCHC: 34.5 g/dL (ref 30.0–36.0)
MCV: 88 fL (ref 80.0–100.0)
Monocytes Absolute: 0.3 10*3/uL (ref 0.1–1.0)
Monocytes Relative: 3 %
Neutro Abs: 8 10*3/uL — ABNORMAL HIGH (ref 1.7–7.7)
Neutrophils Relative %: 79 %
Platelet Count: 296 10*3/uL (ref 150–400)
RBC: 3.99 MIL/uL — ABNORMAL LOW (ref 4.22–5.81)
RDW: 12.9 % (ref 11.5–15.5)
WBC Count: 10 10*3/uL (ref 4.0–10.5)
nRBC: 0 % (ref 0.0–0.2)

## 2020-05-22 LAB — CMP (CANCER CENTER ONLY)
ALT: 12 U/L (ref 0–44)
AST: 13 U/L — ABNORMAL LOW (ref 15–41)
Albumin: 3.5 g/dL (ref 3.5–5.0)
Alkaline Phosphatase: 94 U/L (ref 38–126)
Anion gap: 10 (ref 5–15)
BUN: 19 mg/dL (ref 6–20)
CO2: 24 mmol/L (ref 22–32)
Calcium: 9.2 mg/dL (ref 8.9–10.3)
Chloride: 100 mmol/L (ref 98–111)
Creatinine: 0.73 mg/dL (ref 0.61–1.24)
GFR, Estimated: >60 mL/min (ref >60)
Glucose, Bld: 111 mg/dL — ABNORMAL HIGH (ref 70–99)
Potassium: 4.7 mmol/L (ref 3.5–5.1)
Sodium: 134 mmol/L — ABNORMAL LOW (ref 135–145)
Total Bilirubin: 0.4 mg/dL (ref 0.3–1.2)
Total Protein: 6.6 g/dL (ref 6.5–8.1)

## 2020-05-22 LAB — MAGNESIUM: Magnesium: 1.9 mg/dL (ref 1.7–2.4)

## 2020-05-22 MED ORDER — SODIUM CHLORIDE 0.9% FLUSH
10.0000 mL | Freq: Once | INTRAVENOUS | Status: AC
Start: 2020-05-22 — End: 2020-05-22
  Administered 2020-05-22: 10 mL
  Filled 2020-05-22: qty 10

## 2020-05-22 MED ORDER — SODIUM CHLORIDE 0.9 % IV SOLN
30.0000 [IU] | Freq: Once | INTRAVENOUS | Status: AC
  Administered 2020-05-22: 30 [IU] via INTRAVENOUS
  Filled 2020-05-22: qty 10

## 2020-05-22 MED ORDER — PROCHLORPERAZINE MALEATE 10 MG PO TABS
10.0000 mg | ORAL_TABLET | Freq: Once | ORAL | Status: AC
  Administered 2020-05-22: 10 mg via ORAL

## 2020-05-22 MED ORDER — PROMETHAZINE HCL 25 MG PO TABS: 25.0000 mg | ORAL_TABLET | Freq: Four times a day (QID) | ORAL | 0 refills | Status: DC | PRN

## 2020-05-22 MED ORDER — SODIUM CHLORIDE 0.9 % IV SOLN
Freq: Once | INTRAVENOUS | Status: AC
  Filled 2020-05-22: qty 250

## 2020-05-22 NOTE — Telephone Encounter (Signed)
Phoned in phenergan. LM for Will

## 2020-05-22 NOTE — Patient Instructions (Signed)
[facility]  Discharge Instructions: Thank you for choosing [facility] to provide your oncology and hematology care.   If you have a lab appointment with the Cancer Center, please go directly to the Cancer Center and check in at the registration area.   Wear comfortable clothing and clothing appropriate for easy access to any Portacath or PICC line.   We strive to give you quality time with your provider. You may need to reschedule your appointment if you arrive late (15 or more minutes).  Arriving late affects you and other patients whose appointments are after yours.  Also, if you miss three or more appointments without notifying the office, you may be dismissed from the clinic at the provider's discretion.      For prescription refill requests, have your pharmacy contact our office and allow 72 hours for refills to be completed.    Today you received the following chemotherapy and/or immunotherapy agents: Bleomycin.      To help prevent nausea and vomiting after your treatment, we encourage you to take your nausea medication as directed.  BELOW ARE SYMPTOMS THAT SHOULD BE REPORTED IMMEDIATELY: . *FEVER GREATER THAN 100.4 F (38 C) OR HIGHER . *CHILLS OR SWEATING . *NAUSEA AND VOMITING THAT IS NOT CONTROLLED WITH YOUR NAUSEA MEDICATION . *UNUSUAL SHORTNESS OF BREATH . *UNUSUAL BRUISING OR BLEEDING . *URINARY PROBLEMS (pain or burning when urinating, or frequent urination) . *BOWEL PROBLEMS (unusual diarrhea, constipation, pain near the anus) . TENDERNESS IN MOUTH AND THROAT WITH OR WITHOUT PRESENCE OF ULCERS (sore throat, sores in mouth, or a toothache) . UNUSUAL RASH, SWELLING OR PAIN  . UNUSUAL VAGINAL DISCHARGE OR ITCHING   Items with * indicate a potential emergency and should be followed up as soon as possible or go to the Emergency Department if any problems should occur.  Please show the CHEMOTHERAPY ALERT CARD or IMMUNOTHERAPY ALERT  CARD at check-in to the Emergency Department and triage nurse.  Should you have questions after your visit or need to cancel or reschedule your appointment, please contact [facility]  Dept: [phone]  and follow the prompts.  Office hours are 8:00 a.m. to 4:30 p.m. Monday - Friday. Please note that voicemails left after 4:00 p.m. may not be returned until the following business day.  We are closed weekends and major holidays. You have access to a nurse at all times for urgent questions. Please call the main number to the clinic Dept: [phone] and follow the prompts.   For any non-urgent questions, you may also contact your provider using MyChart. We now offer e-Visits for anyone 18 and older to request care online for non-urgent symptoms. For details visit [email or web address].   Also download the MyChart app! Go to the app store, search "MyChart", open the app, select , and log in with your MyChart username and password.  Due to Covid, a mask is required upon entering the hospital/clinic. If you do not have a mask, one will be given to you upon arrival. For doctor visits, patients may have 1 support person aged 18 or older with them. For treatment visits, patients cannot have anyone with them due to current Covid guidelines and our immunocompromised population.   

## 2020-05-29 ENCOUNTER — Other Ambulatory Visit: Payer: Self-pay

## 2020-05-29 ENCOUNTER — Other Ambulatory Visit: Payer: BC Managed Care – PPO

## 2020-05-29 ENCOUNTER — Inpatient Hospital Stay: Payer: BC Managed Care – PPO

## 2020-05-29 VITALS — BP 132/90 | HR 100 | Temp 98.7°F | Resp 16 | Wt 209.2 lb

## 2020-05-29 DIAGNOSIS — Z9079 Acquired absence of other genital organ(s): Secondary | ICD-10-CM | POA: Diagnosis not present

## 2020-05-29 DIAGNOSIS — C6292 Malignant neoplasm of left testis, unspecified whether descended or undescended: Secondary | ICD-10-CM | POA: Diagnosis not present

## 2020-05-29 DIAGNOSIS — T451X5A Adverse effect of antineoplastic and immunosuppressive drugs, initial encounter: Secondary | ICD-10-CM | POA: Diagnosis not present

## 2020-05-29 DIAGNOSIS — Z5111 Encounter for antineoplastic chemotherapy: Secondary | ICD-10-CM | POA: Diagnosis not present

## 2020-05-29 DIAGNOSIS — D701 Agranulocytosis secondary to cancer chemotherapy: Secondary | ICD-10-CM | POA: Diagnosis not present

## 2020-05-29 DIAGNOSIS — Z5189 Encounter for other specified aftercare: Secondary | ICD-10-CM | POA: Diagnosis not present

## 2020-05-29 DIAGNOSIS — G43909 Migraine, unspecified, not intractable, without status migrainosus: Secondary | ICD-10-CM | POA: Diagnosis not present

## 2020-05-29 DIAGNOSIS — Z95828 Presence of other vascular implants and grafts: Secondary | ICD-10-CM

## 2020-05-29 DIAGNOSIS — Z79899 Other long term (current) drug therapy: Secondary | ICD-10-CM | POA: Diagnosis not present

## 2020-05-29 LAB — CMP (CANCER CENTER ONLY)
ALT: 15 U/L (ref 0–44)
AST: 16 U/L (ref 15–41)
Albumin: 3.5 g/dL (ref 3.5–5.0)
Alkaline Phosphatase: 84 U/L (ref 38–126)
Anion gap: 10 (ref 5–15)
BUN: 12 mg/dL (ref 6–20)
CO2: 24 mmol/L (ref 22–32)
Calcium: 9.3 mg/dL (ref 8.9–10.3)
Chloride: 103 mmol/L (ref 98–111)
Creatinine: 0.73 mg/dL (ref 0.61–1.24)
GFR, Estimated: >60 mL/min (ref >60)
Glucose, Bld: 101 mg/dL — ABNORMAL HIGH (ref 70–99)
Potassium: 4.3 mmol/L (ref 3.5–5.1)
Sodium: 137 mmol/L (ref 135–145)
Total Bilirubin: <0.2 mg/dL — ABNORMAL LOW (ref 0.3–1.2)
Total Protein: 7 g/dL (ref 6.5–8.1)

## 2020-05-29 LAB — CBC WITH DIFFERENTIAL (CANCER CENTER ONLY)
Abs Immature Granulocytes: 4.27 10*3/uL — ABNORMAL HIGH (ref 0.00–0.07)
Basophils Absolute: 0 10*3/uL (ref 0.0–0.1)
Basophils Relative: 0 %
Eosinophils Absolute: 0 10*3/uL (ref 0.0–0.5)
Eosinophils Relative: 0 %
HCT: 34.6 % — ABNORMAL LOW (ref 39.0–52.0)
Hemoglobin: 11.7 g/dL — ABNORMAL LOW (ref 13.0–17.0)
Immature Granulocytes: 30 %
Lymphocytes Relative: 11 %
Lymphs Abs: 1.5 10*3/uL (ref 0.7–4.0)
MCH: 29.9 pg (ref 26.0–34.0)
MCHC: 33.8 g/dL (ref 30.0–36.0)
MCV: 88.5 fL (ref 80.0–100.0)
Monocytes Absolute: 1.3 10*3/uL — ABNORMAL HIGH (ref 0.1–1.0)
Monocytes Relative: 9 %
Neutro Abs: 7.2 10*3/uL (ref 1.7–7.7)
Neutrophils Relative %: 50 %
Platelet Count: 92 10*3/uL — ABNORMAL LOW (ref 150–400)
RBC: 3.91 MIL/uL — ABNORMAL LOW (ref 4.22–5.81)
RDW: 13.3 % (ref 11.5–15.5)
WBC Count: 14.3 10*3/uL — ABNORMAL HIGH (ref 4.0–10.5)
nRBC: 0.6 % — ABNORMAL HIGH (ref 0.0–0.2)

## 2020-05-29 MED ORDER — SODIUM CHLORIDE 0.9% FLUSH
10.0000 mL | Freq: Once | INTRAVENOUS | Status: AC
  Administered 2020-05-29: 10 mL
  Filled 2020-05-29: qty 10

## 2020-05-29 MED ORDER — SODIUM CHLORIDE 0.9 % IV SOLN
30.0000 [IU] | Freq: Once | INTRAVENOUS | Status: AC
Start: 2020-05-29 — End: 2020-05-29
  Administered 2020-05-29: 30 [IU] via INTRAVENOUS
  Filled 2020-05-29: qty 10

## 2020-05-29 MED ORDER — SODIUM CHLORIDE 0.9 % IV SOLN
Freq: Once | INTRAVENOUS | Status: AC
  Filled 2020-05-29: qty 250

## 2020-05-29 MED ORDER — HEPARIN SOD (PORK) LOCK FLUSH 100 UNIT/ML IV SOLN
500.0000 [IU] | Freq: Once | INTRAVENOUS | Status: AC | PRN
  Administered 2020-05-29: 500 [IU]
  Filled 2020-05-29: qty 5

## 2020-05-29 MED ORDER — SODIUM CHLORIDE 0.9% FLUSH
10.0000 mL | INTRAVENOUS | Status: DC | PRN
  Administered 2020-05-29: 10 mL
  Filled 2020-05-29: qty 10

## 2020-05-29 MED ORDER — PROCHLORPERAZINE MALEATE 10 MG PO TABS
ORAL_TABLET | ORAL | Status: AC
  Filled 2020-05-29: qty 1

## 2020-05-29 MED ORDER — PROCHLORPERAZINE MALEATE 10 MG PO TABS
10.0000 mg | ORAL_TABLET | Freq: Once | ORAL | Status: AC
  Administered 2020-05-29: 10 mg via ORAL

## 2020-05-29 NOTE — Patient Instructions (Signed)
[facility]    Discharge Instructions: Thank you for choosing [facility] to provide your oncology and hematology care.   If you have a lab appointment with the Cancer Center, please go directly to the Cancer Center and check in at the registration area.   Wear comfortable clothing and clothing appropriate for easy access to any Portacath or PICC line.   We strive to give you quality time with your provider. You may need to reschedule your appointment if you arrive late (15 or more minutes).  Arriving late affects you and other patients whose appointments are after yours.  Also, if you miss three or more appointments without notifying the office, you may be dismissed from the clinic at the provider's discretion.      For prescription refill requests, have your pharmacy contact our office and allow 72 hours for refills to be completed.    Today you received the following chemotherapy and/or immunotherapy agents: bleomycin.    To help prevent nausea and vomiting after your treatment, we encourage you to take your nausea medication as directed.  BELOW ARE SYMPTOMS THAT SHOULD BE REPORTED IMMEDIATELY: . *FEVER GREATER THAN 100.4 F (38 C) OR HIGHER . *CHILLS OR SWEATING . *NAUSEA AND VOMITING THAT IS NOT CONTROLLED WITH YOUR NAUSEA MEDICATION . *UNUSUAL SHORTNESS OF BREATH . *UNUSUAL BRUISING OR BLEEDING . *URINARY PROBLEMS (pain or burning when urinating, or frequent urination) . *BOWEL PROBLEMS (unusual diarrhea, constipation, pain near the anus) . TENDERNESS IN MOUTH AND THROAT WITH OR WITHOUT PRESENCE OF ULCERS (sore throat, sores in mouth, or a toothache) . UNUSUAL RASH, SWELLING OR PAIN  . UNUSUAL VAGINAL DISCHARGE OR ITCHING   Items with * indicate a potential emergency and should be followed up as soon as possible or go to the Emergency Department if any problems should occur.  Please show the CHEMOTHERAPY ALERT CARD or IMMUNOTHERAPY  ALERT CARD at check-in to the Emergency Department and triage nurse.  Should you have questions after your visit or need to cancel or reschedule your appointment, please contact [facility]  Dept: [phone]  and follow the prompts.  Office hours are 8:00 a.m. to 4:30 p.m. Monday - Friday. Please note that voicemails left after 4:00 p.m. may not be returned until the following business day.  We are closed weekends and major holidays. You have access to a nurse at all times for urgent questions. Please call the main number to the clinic Dept: [phone] and follow the prompts.   For any non-urgent questions, you may also contact your provider using MyChart. We now offer e-Visits for anyone 18 and older to request care online for non-urgent symptoms. For details visit [email or web address].   Also download the MyChart app! Go to the app store, search "MyChart", open the app, select [facility], and log in with your MyChart username and password.  Due to Covid, a mask is required upon entering the hospital/clinic. If you do not have a mask, one will be given to you upon arrival. For doctor visits, patients may have 1 support person aged 18 or older with them. For treatment visits, patients cannot have anyone with them due to current Covid guidelines and our immunocompromised population.   

## 2020-05-29 NOTE — Progress Notes (Signed)
Patient c/o persistent bilateral foot discomfort (plantar surface). States he had a blister on the bottom of his right foot that has now resolved. Also c/o transient discomfort and blisters to bilateral hands. No obvious acute changes noted at this time. Also, c/o right lower leg pain ("along the saphenous vein) that began several days ago. States that pain has since resolved. On visual assessment, no abnormalities appreciated. Dr. Alen Blew aware of complaints and advised to proceed with treatment as ordered. No additional orders received. Encouraged patient to call our office if symptoms return and/or worsen. Patient verbalized understanding.

## 2020-05-31 ENCOUNTER — Encounter: Payer: Self-pay | Admitting: Oncology

## 2020-06-04 ENCOUNTER — Inpatient Hospital Stay (HOSPITAL_BASED_OUTPATIENT_CLINIC_OR_DEPARTMENT_OTHER): Payer: BC Managed Care – PPO | Admitting: Oncology

## 2020-06-04 ENCOUNTER — Inpatient Hospital Stay: Payer: BC Managed Care – PPO

## 2020-06-04 ENCOUNTER — Other Ambulatory Visit: Payer: Self-pay

## 2020-06-04 ENCOUNTER — Encounter: Payer: Self-pay | Admitting: Oncology

## 2020-06-04 VITALS — BP 128/85 | HR 110 | Temp 97.8°F | Resp 20 | Ht 72.0 in | Wt 206.6 lb

## 2020-06-04 VITALS — HR 98

## 2020-06-04 DIAGNOSIS — D701 Agranulocytosis secondary to cancer chemotherapy: Secondary | ICD-10-CM | POA: Diagnosis not present

## 2020-06-04 DIAGNOSIS — Z5189 Encounter for other specified aftercare: Secondary | ICD-10-CM | POA: Diagnosis not present

## 2020-06-04 DIAGNOSIS — Z79899 Other long term (current) drug therapy: Secondary | ICD-10-CM | POA: Diagnosis not present

## 2020-06-04 DIAGNOSIS — G43909 Migraine, unspecified, not intractable, without status migrainosus: Secondary | ICD-10-CM | POA: Diagnosis not present

## 2020-06-04 DIAGNOSIS — T451X5A Adverse effect of antineoplastic and immunosuppressive drugs, initial encounter: Secondary | ICD-10-CM | POA: Diagnosis not present

## 2020-06-04 DIAGNOSIS — C6292 Malignant neoplasm of left testis, unspecified whether descended or undescended: Secondary | ICD-10-CM

## 2020-06-04 DIAGNOSIS — Z9079 Acquired absence of other genital organ(s): Secondary | ICD-10-CM | POA: Diagnosis not present

## 2020-06-04 DIAGNOSIS — Z95828 Presence of other vascular implants and grafts: Secondary | ICD-10-CM

## 2020-06-04 DIAGNOSIS — Z5111 Encounter for antineoplastic chemotherapy: Secondary | ICD-10-CM | POA: Diagnosis not present

## 2020-06-04 LAB — CBC WITH DIFFERENTIAL (CANCER CENTER ONLY)
Abs Immature Granulocytes: 0.36 10*3/uL — ABNORMAL HIGH (ref 0.00–0.07)
Basophils Absolute: 0.1 10*3/uL (ref 0.0–0.1)
Basophils Relative: 1 %
Eosinophils Absolute: 0 10*3/uL (ref 0.0–0.5)
Eosinophils Relative: 0 %
HCT: 34.7 % — ABNORMAL LOW (ref 39.0–52.0)
Hemoglobin: 12.1 g/dL — ABNORMAL LOW (ref 13.0–17.0)
Immature Granulocytes: 4 %
Lymphocytes Relative: 13 %
Lymphs Abs: 1.2 10*3/uL (ref 0.7–4.0)
MCH: 30.3 pg (ref 26.0–34.0)
MCHC: 34.9 g/dL (ref 30.0–36.0)
MCV: 86.8 fL (ref 80.0–100.0)
Monocytes Absolute: 1.2 10*3/uL — ABNORMAL HIGH (ref 0.1–1.0)
Monocytes Relative: 13 %
Neutro Abs: 6.6 10*3/uL (ref 1.7–7.7)
Neutrophils Relative %: 69 %
Platelet Count: 264 10*3/uL (ref 150–400)
RBC: 4 MIL/uL — ABNORMAL LOW (ref 4.22–5.81)
RDW: 13.6 % (ref 11.5–15.5)
WBC Count: 9.4 10*3/uL (ref 4.0–10.5)
nRBC: 0.3 % — ABNORMAL HIGH (ref 0.0–0.2)

## 2020-06-04 LAB — CMP (CANCER CENTER ONLY)
ALT: 11 U/L (ref 0–44)
AST: 11 U/L — ABNORMAL LOW (ref 15–41)
Albumin: 3.6 g/dL (ref 3.5–5.0)
Alkaline Phosphatase: 77 U/L (ref 38–126)
Anion gap: 10 (ref 5–15)
BUN: 15 mg/dL (ref 6–20)
CO2: 24 mmol/L (ref 22–32)
Calcium: 9.9 mg/dL (ref 8.9–10.3)
Chloride: 103 mmol/L (ref 98–111)
Creatinine: 0.81 mg/dL (ref 0.61–1.24)
GFR, Estimated: >60 mL/min (ref >60)
Glucose, Bld: 122 mg/dL — ABNORMAL HIGH (ref 70–99)
Potassium: 4.6 mmol/L (ref 3.5–5.1)
Sodium: 137 mmol/L (ref 135–145)
Total Bilirubin: 0.2 mg/dL — ABNORMAL LOW (ref 0.3–1.2)
Total Protein: 7.6 g/dL (ref 6.5–8.1)

## 2020-06-04 MED ORDER — PALONOSETRON HCL INJECTION 0.25 MG/5ML
0.2500 mg | Freq: Once | INTRAVENOUS | Status: AC
Start: 2020-06-04 — End: 2020-06-04
  Administered 2020-06-04: 0.25 mg via INTRAVENOUS

## 2020-06-04 MED ORDER — SODIUM CHLORIDE 0.9 % IV SOLN
100.0000 mg/m2 | Freq: Once | INTRAVENOUS | Status: AC
  Administered 2020-06-04: 230 mg via INTRAVENOUS
  Filled 2020-06-04: qty 11.5

## 2020-06-04 MED ORDER — SODIUM CHLORIDE 0.9% FLUSH
10.0000 mL | Freq: Once | INTRAVENOUS | Status: AC
  Administered 2020-06-04: 10 mL
  Filled 2020-06-04: qty 10

## 2020-06-04 MED ORDER — FOSAPREPITANT DIMEGLUMINE INJECTION 150 MG
150.0000 mg | Freq: Once | INTRAVENOUS | Status: AC
  Administered 2020-06-04: 150 mg via INTRAVENOUS
  Filled 2020-06-04: qty 150

## 2020-06-04 MED ORDER — HEPARIN SOD (PORK) LOCK FLUSH 100 UNIT/ML IV SOLN
500.0000 [IU] | Freq: Once | INTRAVENOUS | Status: AC | PRN
  Administered 2020-06-04: 500 [IU]
  Filled 2020-06-04: qty 5

## 2020-06-04 MED ORDER — SODIUM CHLORIDE 0.9 % IV SOLN
Freq: Once | INTRAVENOUS | Status: AC
  Filled 2020-06-04: qty 250

## 2020-06-04 MED ORDER — SODIUM CHLORIDE 0.9% FLUSH
10.0000 mL | INTRAVENOUS | Status: DC | PRN
Start: 2020-06-04 — End: 2020-06-04
  Administered 2020-06-04: 10 mL
  Filled 2020-06-04: qty 10

## 2020-06-04 MED ORDER — MAGNESIUM SULFATE 2 GM/50ML IV SOLN
INTRAVENOUS | Status: AC
  Filled 2020-06-04: qty 50

## 2020-06-04 MED ORDER — TRAMADOL HCL 50 MG PO TABS: 100.0000 mg | ORAL_TABLET | Freq: Four times a day (QID) | ORAL | 0 refills | Status: DC | PRN

## 2020-06-04 MED ORDER — MAGNESIUM SULFATE 2 GM/50ML IV SOLN
2.0000 g | Freq: Once | INTRAVENOUS | Status: AC
  Administered 2020-06-04: 2 g via INTRAVENOUS

## 2020-06-04 MED ORDER — CISPLATIN CHEMO INJECTION 100MG/100ML
20.0000 mg/m2 | Freq: Once | INTRAVENOUS | Status: AC
  Administered 2020-06-04: 46 mg via INTRAVENOUS
  Filled 2020-06-04: qty 46

## 2020-06-04 MED ORDER — POTASSIUM CHLORIDE IN NACL 20-0.9 MEQ/L-% IV SOLN
Freq: Once | INTRAVENOUS | Status: AC
Start: 2020-06-04 — End: 2020-06-04
  Filled 2020-06-04: qty 1000

## 2020-06-04 MED ORDER — PALONOSETRON HCL INJECTION 0.25 MG/5ML
INTRAVENOUS | Status: AC
  Filled 2020-06-04: qty 5

## 2020-06-04 MED ORDER — SODIUM CHLORIDE 0.9 % IV SOLN
10.0000 mg | Freq: Once | INTRAVENOUS | Status: AC
  Administered 2020-06-04: 10 mg via INTRAVENOUS
  Filled 2020-06-04: qty 10

## 2020-06-04 NOTE — Progress Notes (Signed)
Hematology and Oncology   Pilar Corrales [id number] [birth year] 50 y.o. 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  He presented with T2N0 and subsequently developed pelvic adenopathy.  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      Prior Therapy:  1. Status post right radical orchiectomy with the pathology showed a 2.2 cm seminoma in August 2009.   2. He is status post adjuvant radiation therapy to the pelvis with a total of 26 Gy in 16 fractions concluded in October 2009. 3. He is status post radical orchiectomy on the left completed on Jun 10, 2019.  The final pathology showed a 2.5 cm pure seminoma that is organ confined.  Current therapy:  BEP chemotherapy started on April 23, 2020.  He is here for day 1 cycle 3 of therapy.  Interim History: Mr. Ruppert  returns today for a follow-up visit.  Since the last visit, he completed cycle 2 of therapy with very few complaints.  He denies any nausea, vomiting or abdominal pain.  He did report some arthralgias and myalgias and is resolving at this time.  He reported some back pain and Achilles tendon pain but is improving.  Denies any fevers chills or night sweats.  Denies any hospitalization or illnesses after cycle 2.   Medications: Updated on review. Current Outpatient Medications  Medication Sig Dispense Refill  . acetaminophen (TYLENOL) 500 MG tablet Take 1,000 mg by mouth every 6 (six) hours as needed for moderate pain.    . Bacillus Coagulans-Inulin (ALIGN PREBIOTIC-PROBIOTIC PO) Take 1 tablet by mouth daily.    . Cholecalciferol (VITAMIN D3) 5000 UNITS CAPS Take 5,000 Units by mouth daily. Takes Monday thru friday    . cyclobenzaprine (FLEXERIL) 10 MG tablet Take 10 mg by mouth 3 (three) times daily as needed for muscle spasms. As needed    .  docusate sodium (COLACE) 100 MG capsule Take 100 mg by mouth 2 (two) times daily.    [name] esomeprazole (NEXIUM) 20 MG capsule Take 20 mg by mouth at bedtime.    . fluticasone (FLONASE) 50 MCG/ACT nasal spray Place 2 sprays into both nostrils daily.    [name] lidocaine-prilocaine (EMLA) cream Apply 1 application topically as needed. (Patient taking differently: Apply 1 application topically once as needed (port access).) 30 g 3  . ondansetron (ZOFRAN ODT) 4 MG disintegrating tablet Take 1 tablet (4 mg total) by mouth every 8 (eight) hours as needed for nausea or vomiting. 20 tablet 0  . prochlorperazine (COMPAZINE) 10 MG tablet Take 1 tablet (10 mg total) by mouth every 6 (six) hours as needed for nausea or vomiting. 30 tablet 0  . promethazine (PHENERGAN) 25 MG tablet Take 1 tablet (25 mg total) by mouth every 6 (six) hours as needed for nausea or vomiting. 30 tablet 0  . rosuvastatin (CRESTOR) 10 MG tablet Take 10 mg by mouth daily.    [name] testosterone cypionate (DEPOTESTOSTERONE CYPIONATE) 200 MG/ML injection Inject 80 mg into the muscle See admin instructions. Every 9 days    . traMADol (ULTRAM) 50 MG tablet Take 2 tablets (100 mg total) by mouth every 6 (six) hours as needed for severe pain. 30 tablet 0  . zolpidem (AMBIEN) 5 MG tablet Take 1 tablet (5 mg total) by mouth at bedtime as needed for sleep. 30 tablet 0  No current facility-administered medications for this visit.     Allergies:  Allergies  Allergen Reactions  . Penicillins     Rash age 50    Physical exam  Blood pressure 128/85, pulse (!) 110, temperature 97.8 F (36.6 C), temperature source Oral, resp. rate 20, height 6' (1.829 m), weight 206 lb 9.6 oz (93.7 kg), SpO2 98 %.    ECOG 0   General appearance: Comfortable appearing without any discomfort Head: Normocephalic without any trauma Oropharynx: Mucous membranes are moist and pink without any thrush or ulcers. Eyes: Pupils are equal and round reactive to light. Lymph  nodes: No cervical, supraclavicular, inguinal or axillary lymphadenopathy.   Heart:regular rate and rhythm.  S1 and S2 without leg edema. Lung: Clear without any rhonchi or wheezes.  No dullness to percussion. Abdomin: Soft, nontender, nondistended with good bowel sounds.  No hepatosplenomegaly. Musculoskeletal: No joint deformity or effusion.  Full range of motion noted. Neurological: No deficits noted on motor, sensory and deep tendon reflex exam. Skin: No petechial rash or dryness.  Appeared moist.     Lab Results: Lab Results  Component Value Date   WBC 14.3 (H) 05/29/2020   HGB 11.7 (L) 05/29/2020   HCT 34.6 (L) 05/29/2020   MCV 88.5 05/29/2020   PLT 92 (L) 05/29/2020     Chemistry      Component Value Date/Time   NA 137 05/29/2020 0937   NA 140 10/09/2016 1428   K 4.3 05/29/2020 0937   K 4.3 10/09/2016 1428   CL 103 05/29/2020 0937   CL 104 03/02/2012 1043   CO2 24 05/29/2020 0937   CO2 27 10/09/2016 1428   BUN 12 05/29/2020 0937   BUN 10.3 10/09/2016 1428   CREATININE 0.73 05/29/2020 0937   CREATININE 0.8 10/09/2016 1428      Component Value Date/Time   CALCIUM 9.3 05/29/2020 0937   CALCIUM 10.2 10/09/2016 1428   ALKPHOS 84 05/29/2020 0937   ALKPHOS 55 10/09/2016 1428   AST 16 05/29/2020 0937   AST 19 10/09/2016 1428   ALT 15 05/29/2020 0937   ALT 17 10/09/2016 1428   BILITOT <0.2 (L) 05/29/2020 0937   BILITOT 0.42 10/09/2016 1428         Impression and Plan:   50 year old man with:  1.    Stage IIB left testicular seminoma presented with left retroperitoneal lymph node in March 2022.     He is status post 2 cycles of BEP chemotherapy and here to proceed with cycle 3.  Risks and benefits of completing cycle 3 of therapy were discussed.  Potential complications and include nausea, vomiting, myelosuppression, neutropenia and pulmonary toxicity were reiterated.  Upon completing cycle 3 will update his staging scans and determine best course of action.   Surgical resection for any residual tumor will be considered pending the size and the location.  He is agreeable to proceed with this plan.  2.  Right testicular seminoma: Remote and no evidence of relapse at this time.  3.    Pulmonary toxicity screening: Baseline pulmonary function tests is normal and will update his PFTs in the near future.  4.    IV access: Port-A-Cath currently in use without any issues.  5.  Antiemetics: Compazine is available to him.  6.  Neutropenia: He developed neutropenia and fever after cycle 1 and will not receive growth factor support after cycle 3.  7.  Migraine headaches: Manageable at this time without any recent exacerbation.  8.  Follow-up: We will be in the next few weeks to follow his progress.    30  minutes were spent on this visit.  The time was dedicated to reviewing his disease status, discussing complications related to his cancer and cancer therapy and future plan of care review.    Zola Button, MD 06/04/2020 7:42 AM

## 2020-06-04 NOTE — Addendum Note (Signed)
Addended by: Wyatt Portela on: 06/04/2020 09:52 AM   Modules accepted: Orders

## 2020-06-04 NOTE — Patient Instructions (Signed)
[facility]  Discharge Instructions: Thank you for choosing [facility] to provide your oncology and hematology care.   If you have a lab appointment with the Cancer Center, please go directly to the Cancer Center and check in at the registration area.   Wear comfortable clothing and clothing appropriate for easy access to any Portacath or PICC line.   We strive to give you quality time with your provider. You may need to reschedule your appointment if you arrive late (15 or more minutes).  Arriving late affects you and other patients whose appointments are after yours.  Also, if you miss three or more appointments without notifying the office, you may be dismissed from the clinic at the provider's discretion.      For prescription refill requests, have your pharmacy contact our office and allow 72 hours for refills to be completed.    Today you received the following chemotherapy and/or immunotherapy agents: cisplatin, etoposide.    To help prevent nausea and vomiting after your treatment, we encourage you to take your nausea medication as directed.  BELOW ARE SYMPTOMS THAT SHOULD BE REPORTED IMMEDIATELY: *FEVER GREATER THAN 100.4 F (38 C) OR HIGHER *CHILLS OR SWEATING *NAUSEA AND VOMITING THAT IS NOT CONTROLLED WITH YOUR NAUSEA MEDICATION *UNUSUAL SHORTNESS OF BREATH *UNUSUAL BRUISING OR BLEEDING *URINARY PROBLEMS (pain or burning when urinating, or frequent urination) *BOWEL PROBLEMS (unusual diarrhea, constipation, pain near the anus) TENDERNESS IN MOUTH AND THROAT WITH OR WITHOUT PRESENCE OF ULCERS (sore throat, sores in mouth, or a toothache) UNUSUAL RASH, SWELLING OR PAIN  UNUSUAL VAGINAL DISCHARGE OR ITCHING   Items with * indicate a potential emergency and should be followed up as soon as possible or go to the Emergency Department if any problems should occur.  Please show the CHEMOTHERAPY ALERT CARD or IMMUNOTHERAPY ALERT CARD at  check-in to the Emergency Department and triage nurse.  Should you have questions after your visit or need to cancel or reschedule your appointment, please contact [facility]  Dept: [phone]  and follow the prompts.  Office hours are 8:00 a.m. to 4:30 p.m. Monday - Friday. Please note that voicemails left after 4:00 p.m. may not be returned until the following business day.  We are closed weekends and major holidays. You have access to a nurse at all times for urgent questions. Please call the main number to the clinic Dept: [phone] and follow the prompts.   For any non-urgent questions, you may also contact your provider using MyChart. We now offer e-Visits for anyone 18 and older to request care online for non-urgent symptoms. For details visit [email or web address].   Also download the MyChart app! Go to the app store, search "MyChart", open the app, select [facility], and log in with your MyChart username and password.  Due to Covid, a mask is required upon entering the hospital/clinic. If you do not have a mask, one will be given to you upon arrival. For doctor visits, patients may have 1 support person aged 18 or older with them. For treatment visits, patients cannot have anyone with them due to current Covid guidelines and our immunocompromised population.   

## 2020-06-05 ENCOUNTER — Inpatient Hospital Stay: Payer: BC Managed Care – PPO

## 2020-06-05 VITALS — BP 120/87 | HR 100 | Temp 98.4°F | Resp 18

## 2020-06-05 DIAGNOSIS — D701 Agranulocytosis secondary to cancer chemotherapy: Secondary | ICD-10-CM | POA: Diagnosis not present

## 2020-06-05 DIAGNOSIS — Z9079 Acquired absence of other genital organ(s): Secondary | ICD-10-CM | POA: Diagnosis not present

## 2020-06-05 DIAGNOSIS — T451X5A Adverse effect of antineoplastic and immunosuppressive drugs, initial encounter: Secondary | ICD-10-CM | POA: Diagnosis not present

## 2020-06-05 DIAGNOSIS — Z79899 Other long term (current) drug therapy: Secondary | ICD-10-CM | POA: Diagnosis not present

## 2020-06-05 DIAGNOSIS — Z5111 Encounter for antineoplastic chemotherapy: Secondary | ICD-10-CM | POA: Diagnosis not present

## 2020-06-05 DIAGNOSIS — Z5189 Encounter for other specified aftercare: Secondary | ICD-10-CM | POA: Diagnosis not present

## 2020-06-05 DIAGNOSIS — C6292 Malignant neoplasm of left testis, unspecified whether descended or undescended: Secondary | ICD-10-CM

## 2020-06-05 DIAGNOSIS — G43909 Migraine, unspecified, not intractable, without status migrainosus: Secondary | ICD-10-CM | POA: Diagnosis not present

## 2020-06-05 MED ORDER — POTASSIUM CHLORIDE IN NACL 20-0.9 MEQ/L-% IV SOLN
Freq: Once | INTRAVENOUS | Status: AC
Start: 2020-06-05 — End: 2020-06-05
  Filled 2020-06-05: qty 1000

## 2020-06-05 MED ORDER — SODIUM CHLORIDE 0.9 % IV SOLN
20.0000 mg/m2 | Freq: Once | INTRAVENOUS | Status: AC
  Administered 2020-06-05: 46 mg via INTRAVENOUS
  Filled 2020-06-05: qty 46

## 2020-06-05 MED ORDER — BLEOMYCIN SULFATE CHEMO INJECTION 30 UNIT
30.0000 [IU] | Freq: Once | INTRAMUSCULAR | Status: AC
Start: 2020-06-05 — End: 2020-06-05
  Administered 2020-06-05: 30 [IU] via INTRAVENOUS
  Filled 2020-06-05: qty 10

## 2020-06-05 MED ORDER — MAGNESIUM SULFATE 2 GM/50ML IV SOLN
INTRAVENOUS | Status: AC
  Filled 2020-06-05: qty 50

## 2020-06-05 MED ORDER — SODIUM CHLORIDE 0.9 % IV SOLN
100.0000 mg/m2 | Freq: Once | INTRAVENOUS | Status: AC
  Administered 2020-06-05: 230 mg via INTRAVENOUS
  Filled 2020-06-05: qty 11.5

## 2020-06-05 MED ORDER — SODIUM CHLORIDE 0.9% FLUSH
10.0000 mL | INTRAVENOUS | Status: DC | PRN
  Administered 2020-06-05: 10 mL
  Filled 2020-06-05: qty 10

## 2020-06-05 MED ORDER — SODIUM CHLORIDE 0.9 % IV SOLN
10.0000 mg | Freq: Once | INTRAVENOUS | Status: AC
  Administered 2020-06-05: 10 mg via INTRAVENOUS
  Filled 2020-06-05: qty 10

## 2020-06-05 MED ORDER — BLEOMYCIN SULFATE CHEMO INJECTION 30 UNIT: 30.0000 [IU] | Freq: Once | INTRAMUSCULAR | Status: DC

## 2020-06-05 MED ORDER — HEPARIN SOD (PORK) LOCK FLUSH 100 UNIT/ML IV SOLN
500.0000 [IU] | Freq: Once | INTRAVENOUS | Status: AC | PRN
  Administered 2020-06-05: 500 [IU]
  Filled 2020-06-05: qty 5

## 2020-06-05 MED ORDER — MAGNESIUM SULFATE 2 GM/50ML IV SOLN
2.0000 g | Freq: Once | INTRAVENOUS | Status: AC
  Administered 2020-06-05: 2 g via INTRAVENOUS

## 2020-06-05 MED ORDER — SODIUM CHLORIDE 0.9 % IV SOLN
Freq: Once | INTRAVENOUS | Status: AC
Start: 2020-06-05 — End: 2020-06-05
  Filled 2020-06-05: qty 250

## 2020-06-05 NOTE — Patient Instructions (Signed)
[facility]  Discharge Instructions: Thank you for choosing [facility] to provide your oncology and hematology care.   If you have a lab appointment with the [facility], please go directly to the [facility] and check in at the registration area.   Wear comfortable clothing and clothing appropriate for easy access to any Portacath or PICC line.   We strive to give you quality time with your provider. You may need to reschedule your appointment if you arrive late (15 or more minutes).  Arriving late affects you and other patients whose appointments are after yours.  Also, if you miss three or more appointments without notifying the office, you may be dismissed from the clinic at the provider's discretion.      For prescription refill requests, have your pharmacy contact our office and allow 72 hours for refills to be completed.    Today you received the following chemotherapy and/or immunotherapy agents Bleocin, Cisplatin and Etoposide      To help prevent nausea and vomiting after your treatment, we encourage you to take your nausea medication as directed.  BELOW ARE SYMPTOMS THAT SHOULD BE REPORTED IMMEDIATELY: . *FEVER GREATER THAN 100.4 F (38 C) OR HIGHER . *CHILLS OR SWEATING . *NAUSEA AND VOMITING THAT IS NOT CONTROLLED WITH YOUR NAUSEA MEDICATION . *UNUSUAL SHORTNESS OF BREATH . *UNUSUAL BRUISING OR BLEEDING . *URINARY PROBLEMS (pain or burning when urinating, or frequent urination) . *BOWEL PROBLEMS (unusual diarrhea, constipation, pain near the anus) . TENDERNESS IN MOUTH AND THROAT WITH OR WITHOUT PRESENCE OF ULCERS (sore throat, sores in mouth, or a toothache) . UNUSUAL RASH, SWELLING OR PAIN  . UNUSUAL VAGINAL DISCHARGE OR ITCHING   Items with * indicate a potential emergency and should be followed up as soon as possible or go to the Emergency Department if any problems should occur.  Please show the CHEMOTHERAPY ALERT CARD  or IMMUNOTHERAPY ALERT CARD at check-in to the Emergency Department and triage nurse.  Should you have questions after your visit or need to cancel or reschedule your appointment, please contact [facility]  Dept: [phone]  and follow the prompts.  Office hours are 8:00 a.m. to 4:30 p.m. Monday - Friday. Please note that voicemails left after 4:00 p.m. may not be returned until the following business day.  We are closed weekends and major holidays. You have access to a nurse at all times for urgent questions. Please call the main number to the clinic Dept: [phone] and follow the prompts.   For any non-urgent questions, you may also contact your provider using MyChart. We now offer e-Visits for anyone 21 and older to request care online for non-urgent symptoms. For details visit [email or web address].   Also download the MyChart app! Go to the app store, search "MyChart", open the app, select [facility], and log in with your MyChart username and password.  Due to Covid, a mask is required upon entering the hospital/clinic. If you do not have a mask, one will be given to you upon arrival. For doctor visits, patients may have 1 support person aged 76 or older with them. For treatment visits, patients cannot have anyone with them due to current Covid guidelines and our immunocompromised population.

## 2020-06-06 ENCOUNTER — Other Ambulatory Visit: Payer: Self-pay

## 2020-06-06 ENCOUNTER — Inpatient Hospital Stay: Payer: BC Managed Care – PPO

## 2020-06-06 VITALS — BP 111/72 | HR 82 | Temp 98.2°F | Resp 16

## 2020-06-06 DIAGNOSIS — D701 Agranulocytosis secondary to cancer chemotherapy: Secondary | ICD-10-CM | POA: Diagnosis not present

## 2020-06-06 DIAGNOSIS — Z9079 Acquired absence of other genital organ(s): Secondary | ICD-10-CM | POA: Diagnosis not present

## 2020-06-06 DIAGNOSIS — G43909 Migraine, unspecified, not intractable, without status migrainosus: Secondary | ICD-10-CM | POA: Diagnosis not present

## 2020-06-06 DIAGNOSIS — Z5189 Encounter for other specified aftercare: Secondary | ICD-10-CM | POA: Diagnosis not present

## 2020-06-06 DIAGNOSIS — Z79899 Other long term (current) drug therapy: Secondary | ICD-10-CM | POA: Diagnosis not present

## 2020-06-06 DIAGNOSIS — C6292 Malignant neoplasm of left testis, unspecified whether descended or undescended: Secondary | ICD-10-CM | POA: Diagnosis not present

## 2020-06-06 DIAGNOSIS — T451X5A Adverse effect of antineoplastic and immunosuppressive drugs, initial encounter: Secondary | ICD-10-CM | POA: Diagnosis not present

## 2020-06-06 DIAGNOSIS — Z5111 Encounter for antineoplastic chemotherapy: Secondary | ICD-10-CM | POA: Diagnosis not present

## 2020-06-06 MED ORDER — SODIUM CHLORIDE 0.9 % IV SOLN
Freq: Once | INTRAVENOUS | Status: AC
  Filled 2020-06-06: qty 250

## 2020-06-06 MED ORDER — PALONOSETRON HCL INJECTION 0.25 MG/5ML
INTRAVENOUS | Status: AC
  Filled 2020-06-06: qty 5

## 2020-06-06 MED ORDER — POTASSIUM CHLORIDE IN NACL 20-0.9 MEQ/L-% IV SOLN
Freq: Once | INTRAVENOUS | Status: AC
Start: 2020-06-06 — End: 2020-06-06
  Filled 2020-06-06: qty 1000

## 2020-06-06 MED ORDER — PALONOSETRON HCL INJECTION 0.25 MG/5ML
0.2500 mg | Freq: Once | INTRAVENOUS | Status: AC
  Administered 2020-06-06: 0.25 mg via INTRAVENOUS

## 2020-06-06 MED ORDER — HEPARIN SOD (PORK) LOCK FLUSH 100 UNIT/ML IV SOLN
500.0000 [IU] | Freq: Once | INTRAVENOUS | Status: AC | PRN
  Administered 2020-06-06: 500 [IU]
  Filled 2020-06-06: qty 5

## 2020-06-06 MED ORDER — SODIUM CHLORIDE 0.9 % IV SOLN
100.0000 mg/m2 | Freq: Once | INTRAVENOUS | Status: AC
  Administered 2020-06-06: 230 mg via INTRAVENOUS
  Filled 2020-06-06: qty 11.5

## 2020-06-06 MED ORDER — CISPLATIN CHEMO INJECTION 100MG/100ML
20.0000 mg/m2 | Freq: Once | INTRAVENOUS | Status: AC
  Administered 2020-06-06: 46 mg via INTRAVENOUS
  Filled 2020-06-06: qty 46

## 2020-06-06 MED ORDER — FOSAPREPITANT DIMEGLUMINE INJECTION 150 MG
150.0000 mg | Freq: Once | INTRAVENOUS | Status: AC
  Administered 2020-06-06: 150 mg via INTRAVENOUS
  Filled 2020-06-06: qty 150

## 2020-06-06 MED ORDER — SODIUM CHLORIDE 0.9 % IV SOLN
10.0000 mg | Freq: Once | INTRAVENOUS | Status: AC
  Administered 2020-06-06: 10 mg via INTRAVENOUS
  Filled 2020-06-06: qty 10

## 2020-06-06 MED ORDER — MAGNESIUM SULFATE 2 GM/50ML IV SOLN
INTRAVENOUS | Status: AC
  Filled 2020-06-06: qty 50

## 2020-06-06 MED ORDER — SODIUM CHLORIDE 0.9% FLUSH
10.0000 mL | INTRAVENOUS | Status: DC | PRN
  Administered 2020-06-06: 10 mL
  Filled 2020-06-06: qty 10

## 2020-06-06 MED ORDER — MAGNESIUM SULFATE 2 GM/50ML IV SOLN
2.0000 g | Freq: Once | INTRAVENOUS | Status: AC
  Administered 2020-06-06: 2 g via INTRAVENOUS

## 2020-06-06 NOTE — Patient Instructions (Signed)
[facility]  Discharge Instructions: °Thank you for choosing [facility] to provide your oncology and hematology care.  ° °If you have a lab appointment with the Cancer Center, please go directly to the Cancer Center and check in at the registration area. °  °Wear comfortable clothing and clothing appropriate for easy access to any Portacath or PICC line.  ° °We strive to give you quality time with your provider. You may need to reschedule your appointment if you arrive late (15 or more minutes).  Arriving late affects you and other patients whose appointments are after yours.  Also, if you miss three or more appointments without notifying the office, you may be dismissed from the clinic at the provider’s discretion.    °  °For prescription refill requests, have your pharmacy contact our office and allow 72 hours for refills to be completed.   ° °Today you received the following chemotherapy and/or immunotherapy agents: Cisplatin and Etoposide    °  °To help prevent nausea and vomiting after your treatment, we encourage you to take your nausea medication as directed. ° °BELOW ARE SYMPTOMS THAT SHOULD BE REPORTED IMMEDIATELY: °*FEVER GREATER THAN 100.4 F (38 °C) OR HIGHER °*CHILLS OR SWEATING °*NAUSEA AND VOMITING THAT IS NOT CONTROLLED WITH YOUR NAUSEA MEDICATION °*UNUSUAL SHORTNESS OF BREATH °*UNUSUAL BRUISING OR BLEEDING °*URINARY PROBLEMS (pain or burning when urinating, or frequent urination) °*BOWEL PROBLEMS (unusual diarrhea, constipation, pain near the anus) °TENDERNESS IN MOUTH AND THROAT WITH OR WITHOUT PRESENCE OF ULCERS (sore throat, sores in mouth, or a toothache) °UNUSUAL RASH, SWELLING OR PAIN  °UNUSUAL VAGINAL DISCHARGE OR ITCHING  ° °Items with * indicate a potential emergency and should be followed up as soon as possible or go to the Emergency Department if any problems should occur. ° °Please show the CHEMOTHERAPY ALERT CARD or IMMUNOTHERAPY ALERT CARD  at check-in to the Emergency Department and triage nurse. ° °Should you have questions after your visit or need to cancel or reschedule your appointment, please contact [facility]  Dept: [phone]  and follow the prompts.  Office hours are 8:00 a.m. to 4:30 p.m. Monday - Friday. Please note that voicemails left after 4:00 p.m. may not be returned until the following business day.  We are closed weekends and major holidays. You have access to a nurse at all times for urgent questions. Please call the main number to the clinic Dept: [phone] and follow the prompts. ° ° °For any non-urgent questions, you may also contact your provider using MyChart. We now offer e-Visits for anyone 18 and older to request care online for non-urgent symptoms. For details visit [email or web address]. °  °Also download the MyChart app! Go to the app store, search "MyChart", open the app, select [facility], and log in with your MyChart username and password. ° °Due to Covid, a mask is required upon entering the hospital/clinic. If you do not have a mask, one will be given to you upon arrival. For doctor visits, patients may have 1 support person aged 18 or older with them. For treatment visits, patients cannot have anyone with them due to current Covid guidelines and our immunocompromised population.  ° °

## 2020-06-07 ENCOUNTER — Other Ambulatory Visit: Payer: Self-pay | Admitting: Oncology

## 2020-06-07 ENCOUNTER — Inpatient Hospital Stay: Payer: BC Managed Care – PPO

## 2020-06-07 VITALS — BP 130/75 | HR 82 | Temp 98.5°F | Resp 18

## 2020-06-07 DIAGNOSIS — Z79899 Other long term (current) drug therapy: Secondary | ICD-10-CM | POA: Diagnosis not present

## 2020-06-07 DIAGNOSIS — D701 Agranulocytosis secondary to cancer chemotherapy: Secondary | ICD-10-CM | POA: Diagnosis not present

## 2020-06-07 DIAGNOSIS — G43909 Migraine, unspecified, not intractable, without status migrainosus: Secondary | ICD-10-CM | POA: Diagnosis not present

## 2020-06-07 DIAGNOSIS — Z5189 Encounter for other specified aftercare: Secondary | ICD-10-CM | POA: Diagnosis not present

## 2020-06-07 DIAGNOSIS — Z5111 Encounter for antineoplastic chemotherapy: Secondary | ICD-10-CM | POA: Diagnosis not present

## 2020-06-07 DIAGNOSIS — Z9079 Acquired absence of other genital organ(s): Secondary | ICD-10-CM | POA: Diagnosis not present

## 2020-06-07 DIAGNOSIS — C6292 Malignant neoplasm of left testis, unspecified whether descended or undescended: Secondary | ICD-10-CM

## 2020-06-07 DIAGNOSIS — T451X5A Adverse effect of antineoplastic and immunosuppressive drugs, initial encounter: Secondary | ICD-10-CM | POA: Diagnosis not present

## 2020-06-07 MED ORDER — POTASSIUM CHLORIDE IN NACL 20-0.9 MEQ/L-% IV SOLN
Freq: Once | INTRAVENOUS | Status: AC
Start: 2020-06-07 — End: 2020-06-07
  Filled 2020-06-07: qty 1000

## 2020-06-07 MED ORDER — MAGNESIUM SULFATE 2 GM/50ML IV SOLN
2.0000 g | Freq: Once | INTRAVENOUS | Status: AC
  Administered 2020-06-07: 2 g via INTRAVENOUS

## 2020-06-07 MED ORDER — SODIUM CHLORIDE 0.9 % IV SOLN
Freq: Once | INTRAVENOUS | Status: AC
Start: 2020-06-07 — End: 2020-06-07
  Filled 2020-06-07: qty 250

## 2020-06-07 MED ORDER — HEPARIN SOD (PORK) LOCK FLUSH 100 UNIT/ML IV SOLN
500.0000 [IU] | Freq: Once | INTRAVENOUS | Status: AC | PRN
  Administered 2020-06-07: 500 [IU]
  Filled 2020-06-07: qty 5

## 2020-06-07 MED ORDER — MAGNESIUM SULFATE 2 GM/50ML IV SOLN
INTRAVENOUS | Status: AC
  Filled 2020-06-07: qty 50

## 2020-06-07 MED ORDER — SODIUM CHLORIDE 0.9% FLUSH
10.0000 mL | INTRAVENOUS | Status: DC | PRN
  Administered 2020-06-07: 10 mL
  Filled 2020-06-07: qty 10

## 2020-06-07 MED ORDER — SODIUM CHLORIDE 0.9 % IV SOLN
10.0000 mg | Freq: Once | INTRAVENOUS | Status: AC
  Administered 2020-06-07: 10 mg via INTRAVENOUS
  Filled 2020-06-07: qty 10

## 2020-06-07 MED ORDER — SODIUM CHLORIDE 0.9 % IV SOLN
100.0000 mg/m2 | Freq: Once | INTRAVENOUS | Status: AC
  Administered 2020-06-07: 230 mg via INTRAVENOUS
  Filled 2020-06-07: qty 11.5

## 2020-06-07 MED ORDER — SODIUM CHLORIDE 0.9 % IV SOLN
20.0000 mg/m2 | Freq: Once | INTRAVENOUS | Status: AC
  Administered 2020-06-07: 46 mg via INTRAVENOUS
  Filled 2020-06-07: qty 46

## 2020-06-07 NOTE — Patient Instructions (Signed)
[facility]  Discharge Instructions: °Thank you for choosing [facility] to provide your oncology and hematology care.  ° °If you have a lab appointment with the Cancer Center, please go directly to the Cancer Center and check in at the registration area. °  °Wear comfortable clothing and clothing appropriate for easy access to any Portacath or PICC line.  ° °We strive to give you quality time with your provider. You may need to reschedule your appointment if you arrive late (15 or more minutes).  Arriving late affects you and other patients whose appointments are after yours.  Also, if you miss three or more appointments without notifying the office, you may be dismissed from the clinic at the provider’s discretion.    °  °For prescription refill requests, have your pharmacy contact our office and allow 72 hours for refills to be completed.   ° °Today you received the following chemotherapy and/or immunotherapy agents: Cisplatin and Etoposide    °  °To help prevent nausea and vomiting after your treatment, we encourage you to take your nausea medication as directed. ° °BELOW ARE SYMPTOMS THAT SHOULD BE REPORTED IMMEDIATELY: °*FEVER GREATER THAN 100.4 F (38 °C) OR HIGHER °*CHILLS OR SWEATING °*NAUSEA AND VOMITING THAT IS NOT CONTROLLED WITH YOUR NAUSEA MEDICATION °*UNUSUAL SHORTNESS OF BREATH °*UNUSUAL BRUISING OR BLEEDING °*URINARY PROBLEMS (pain or burning when urinating, or frequent urination) °*BOWEL PROBLEMS (unusual diarrhea, constipation, pain near the anus) °TENDERNESS IN MOUTH AND THROAT WITH OR WITHOUT PRESENCE OF ULCERS (sore throat, sores in mouth, or a toothache) °UNUSUAL RASH, SWELLING OR PAIN  °UNUSUAL VAGINAL DISCHARGE OR ITCHING  ° °Items with * indicate a potential emergency and should be followed up as soon as possible or go to the Emergency Department if any problems should occur. ° °Please show the CHEMOTHERAPY ALERT CARD or IMMUNOTHERAPY ALERT CARD  at check-in to the Emergency Department and triage nurse. ° °Should you have questions after your visit or need to cancel or reschedule your appointment, please contact [facility]  Dept: [phone]  and follow the prompts.  Office hours are 8:00 a.m. to 4:30 p.m. Monday - Friday. Please note that voicemails left after 4:00 p.m. may not be returned until the following business day.  We are closed weekends and major holidays. You have access to a nurse at all times for urgent questions. Please call the main number to the clinic Dept: [phone] and follow the prompts. ° ° °For any non-urgent questions, you may also contact your provider using MyChart. We now offer e-Visits for anyone 18 and older to request care online for non-urgent symptoms. For details visit [email or web address]. °  °Also download the MyChart app! Go to the app store, search "MyChart", open the app, select , and log in with your MyChart username and password. ° °Due to Covid, a mask is required upon entering the hospital/clinic. If you do not have a mask, one will be given to you upon arrival. For doctor visits, patients may have 1 support person aged 18 or older with them. For treatment visits, patients cannot have anyone with them due to current Covid guidelines and our immunocompromised population.  ° °

## 2020-06-08 ENCOUNTER — Inpatient Hospital Stay: Payer: BC Managed Care – PPO

## 2020-06-08 ENCOUNTER — Other Ambulatory Visit: Payer: Self-pay

## 2020-06-08 VITALS — BP 113/81 | HR 96 | Temp 98.6°F | Resp 18

## 2020-06-08 DIAGNOSIS — Z79899 Other long term (current) drug therapy: Secondary | ICD-10-CM | POA: Diagnosis not present

## 2020-06-08 DIAGNOSIS — D701 Agranulocytosis secondary to cancer chemotherapy: Secondary | ICD-10-CM | POA: Diagnosis not present

## 2020-06-08 DIAGNOSIS — T451X5A Adverse effect of antineoplastic and immunosuppressive drugs, initial encounter: Secondary | ICD-10-CM | POA: Diagnosis not present

## 2020-06-08 DIAGNOSIS — C6292 Malignant neoplasm of left testis, unspecified whether descended or undescended: Secondary | ICD-10-CM

## 2020-06-08 DIAGNOSIS — G43909 Migraine, unspecified, not intractable, without status migrainosus: Secondary | ICD-10-CM | POA: Diagnosis not present

## 2020-06-08 DIAGNOSIS — Z5189 Encounter for other specified aftercare: Secondary | ICD-10-CM | POA: Diagnosis not present

## 2020-06-08 DIAGNOSIS — Z9079 Acquired absence of other genital organ(s): Secondary | ICD-10-CM | POA: Diagnosis not present

## 2020-06-08 DIAGNOSIS — Z5111 Encounter for antineoplastic chemotherapy: Secondary | ICD-10-CM | POA: Diagnosis not present

## 2020-06-08 MED ORDER — SODIUM CHLORIDE 0.9% FLUSH
10.0000 mL | INTRAVENOUS | Status: DC | PRN
  Administered 2020-06-08: 10 mL
  Filled 2020-06-08: qty 10

## 2020-06-08 MED ORDER — POTASSIUM CHLORIDE IN NACL 20-0.9 MEQ/L-% IV SOLN
Freq: Once | INTRAVENOUS | Status: AC
Start: 2020-06-08 — End: 2020-06-08
  Filled 2020-06-08: qty 1000

## 2020-06-08 MED ORDER — PEGFILGRASTIM 6 MG/0.6ML ~~LOC~~ PSKT
PREFILLED_SYRINGE | SUBCUTANEOUS | Status: AC
  Filled 2020-06-08: qty 0.6

## 2020-06-08 MED ORDER — MAGNESIUM SULFATE 2 GM/50ML IV SOLN
2.0000 g | Freq: Once | INTRAVENOUS | Status: AC
  Administered 2020-06-08: 2 g via INTRAVENOUS

## 2020-06-08 MED ORDER — HEPARIN SOD (PORK) LOCK FLUSH 100 UNIT/ML IV SOLN
500.0000 [IU] | Freq: Once | INTRAVENOUS | Status: AC | PRN
  Administered 2020-06-08: 500 [IU]
  Filled 2020-06-08: qty 5

## 2020-06-08 MED ORDER — PALONOSETRON HCL INJECTION 0.25 MG/5ML
INTRAVENOUS | Status: AC
  Filled 2020-06-08: qty 5

## 2020-06-08 MED ORDER — SODIUM CHLORIDE 0.9 % IV SOLN
150.0000 mg | Freq: Once | INTRAVENOUS | Status: AC
  Administered 2020-06-08: 150 mg via INTRAVENOUS
  Filled 2020-06-08: qty 150

## 2020-06-08 MED ORDER — MAGNESIUM SULFATE 2 GM/50ML IV SOLN
INTRAVENOUS | Status: AC
  Filled 2020-06-08: qty 50

## 2020-06-08 MED ORDER — SODIUM CHLORIDE 0.9 % IV SOLN
Freq: Once | INTRAVENOUS | Status: AC
  Filled 2020-06-08: qty 250

## 2020-06-08 MED ORDER — SODIUM CHLORIDE 0.9 % IV SOLN
20.0000 mg/m2 | Freq: Once | INTRAVENOUS | Status: AC
  Administered 2020-06-08: 46 mg via INTRAVENOUS
  Filled 2020-06-08: qty 46

## 2020-06-08 MED ORDER — SODIUM CHLORIDE 0.9 % IV SOLN
10.0000 mg | Freq: Once | INTRAVENOUS | Status: AC
  Administered 2020-06-08: 10 mg via INTRAVENOUS
  Filled 2020-06-08: qty 10

## 2020-06-08 MED ORDER — PALONOSETRON HCL INJECTION 0.25 MG/5ML
0.2500 mg | Freq: Once | INTRAVENOUS | Status: AC
  Administered 2020-06-08: 0.25 mg via INTRAVENOUS

## 2020-06-08 MED ORDER — SODIUM CHLORIDE 0.9 % IV SOLN
100.0000 mg/m2 | Freq: Once | INTRAVENOUS | Status: AC
  Administered 2020-06-08: 230 mg via INTRAVENOUS
  Filled 2020-06-08: qty 11.5

## 2020-06-12 ENCOUNTER — Other Ambulatory Visit: Payer: Self-pay

## 2020-06-12 ENCOUNTER — Inpatient Hospital Stay: Payer: BC Managed Care – PPO

## 2020-06-12 VITALS — BP 116/80 | HR 104 | Temp 98.5°F | Resp 16

## 2020-06-12 DIAGNOSIS — C6292 Malignant neoplasm of left testis, unspecified whether descended or undescended: Secondary | ICD-10-CM

## 2020-06-12 DIAGNOSIS — G43909 Migraine, unspecified, not intractable, without status migrainosus: Secondary | ICD-10-CM | POA: Diagnosis not present

## 2020-06-12 DIAGNOSIS — Z9079 Acquired absence of other genital organ(s): Secondary | ICD-10-CM | POA: Diagnosis not present

## 2020-06-12 DIAGNOSIS — Z5189 Encounter for other specified aftercare: Secondary | ICD-10-CM | POA: Diagnosis not present

## 2020-06-12 DIAGNOSIS — T451X5A Adverse effect of antineoplastic and immunosuppressive drugs, initial encounter: Secondary | ICD-10-CM | POA: Diagnosis not present

## 2020-06-12 DIAGNOSIS — Z79899 Other long term (current) drug therapy: Secondary | ICD-10-CM | POA: Diagnosis not present

## 2020-06-12 DIAGNOSIS — Z5111 Encounter for antineoplastic chemotherapy: Secondary | ICD-10-CM | POA: Diagnosis not present

## 2020-06-12 DIAGNOSIS — D701 Agranulocytosis secondary to cancer chemotherapy: Secondary | ICD-10-CM | POA: Diagnosis not present

## 2020-06-12 LAB — CBC WITH DIFFERENTIAL (CANCER CENTER ONLY)
Abs Immature Granulocytes: 0.02 10*3/uL (ref 0.00–0.07)
Basophils Absolute: 0 10*3/uL (ref 0.0–0.1)
Basophils Relative: 1 %
Eosinophils Absolute: 0 10*3/uL (ref 0.0–0.5)
Eosinophils Relative: 1 %
HCT: 30.3 % — ABNORMAL LOW (ref 39.0–52.0)
Hemoglobin: 10.7 g/dL — ABNORMAL LOW (ref 13.0–17.0)
Immature Granulocytes: 1 %
Lymphocytes Relative: 18 %
Lymphs Abs: 0.4 10*3/uL — ABNORMAL LOW (ref 0.7–4.0)
MCH: 30.9 pg (ref 26.0–34.0)
MCHC: 35.3 g/dL (ref 30.0–36.0)
MCV: 87.6 fL (ref 80.0–100.0)
Monocytes Absolute: 0.1 10*3/uL (ref 0.1–1.0)
Monocytes Relative: 2 %
Neutro Abs: 1.7 10*3/uL (ref 1.7–7.7)
Neutrophils Relative %: 77 %
Platelet Count: 268 10*3/uL (ref 150–400)
RBC: 3.46 MIL/uL — ABNORMAL LOW (ref 4.22–5.81)
RDW: 13.7 % (ref 11.5–15.5)
WBC Count: 2.2 10*3/uL — ABNORMAL LOW (ref 4.0–10.5)
nRBC: 0 % (ref 0.0–0.2)

## 2020-06-12 LAB — CMP (CANCER CENTER ONLY)
ALT: 13 U/L (ref 0–44)
AST: 10 U/L — ABNORMAL LOW (ref 15–41)
Albumin: 3.5 g/dL (ref 3.5–5.0)
Alkaline Phosphatase: 47 U/L (ref 38–126)
Anion gap: 10 (ref 5–15)
BUN: 20 mg/dL (ref 6–20)
CO2: 27 mmol/L (ref 22–32)
Calcium: 9.2 mg/dL (ref 8.9–10.3)
Chloride: 100 mmol/L (ref 98–111)
Creatinine: 0.73 mg/dL (ref 0.61–1.24)
GFR, Estimated: >60 mL/min (ref >60)
Glucose, Bld: 113 mg/dL — ABNORMAL HIGH (ref 70–99)
Potassium: 4.8 mmol/L (ref 3.5–5.1)
Sodium: 137 mmol/L (ref 135–145)
Total Bilirubin: 0.3 mg/dL (ref 0.3–1.2)
Total Protein: 6.4 g/dL — ABNORMAL LOW (ref 6.5–8.1)

## 2020-06-12 LAB — MAGNESIUM: Magnesium: 1.8 mg/dL (ref 1.7–2.4)

## 2020-06-12 MED ORDER — PROCHLORPERAZINE MALEATE 10 MG PO TABS
ORAL_TABLET | ORAL | Status: AC
  Filled 2020-06-12: qty 1

## 2020-06-12 MED ORDER — SODIUM CHLORIDE 0.9 % IV SOLN
Freq: Once | INTRAVENOUS | Status: AC
  Filled 2020-06-12: qty 250

## 2020-06-12 MED ORDER — BLEOMYCIN SULFATE CHEMO INJECTION 30 UNIT
30.0000 [IU] | Freq: Once | INTRAMUSCULAR | Status: AC
Start: 2020-06-12 — End: 2020-06-12
  Administered 2020-06-12: 30 [IU] via INTRAVENOUS
  Filled 2020-06-12: qty 10

## 2020-06-12 MED ORDER — SODIUM CHLORIDE 0.9% FLUSH
10.0000 mL | INTRAVENOUS | Status: DC | PRN
  Administered 2020-06-12: 10 mL
  Filled 2020-06-12: qty 10

## 2020-06-12 MED ORDER — ALTEPLASE 2 MG IJ SOLR
INTRAMUSCULAR | Status: AC
  Filled 2020-06-12: qty 2

## 2020-06-12 MED ORDER — PROCHLORPERAZINE MALEATE 10 MG PO TABS
10.0000 mg | ORAL_TABLET | Freq: Once | ORAL | Status: AC
Start: 2020-06-12 — End: 2020-06-12
  Administered 2020-06-12: 10 mg via ORAL

## 2020-06-12 MED ORDER — ALTEPLASE 2 MG IJ SOLR
2.0000 mg | Freq: Once | INTRAMUSCULAR | Status: AC | PRN
  Administered 2020-06-12: 2 mg
  Filled 2020-06-12: qty 2

## 2020-06-12 MED ORDER — HEPARIN SOD (PORK) LOCK FLUSH 100 UNIT/ML IV SOLN
500.0000 [IU] | Freq: Once | INTRAVENOUS | Status: AC | PRN
  Administered 2020-06-12: 500 [IU]
  Filled 2020-06-12: qty 5

## 2020-06-12 NOTE — Patient Instructions (Signed)
[facility]    Discharge Instructions: Thank you for choosing [facility] to provide your oncology and hematology care.   If you have a lab appointment with the [facility], please go directly to the [facility] and check in at the registration area.   Wear comfortable clothing and clothing appropriate for easy access to any Portacath or PICC line.   We strive to give you quality time with your provider. You may need to reschedule your appointment if you arrive late (15 or more minutes).  Arriving late affects you and other patients whose appointments are after yours.  Also, if you miss three or more appointments without notifying the office, you may be dismissed from the clinic at the provider's discretion.      For prescription refill requests, have your pharmacy contact our office and allow 72 hours for refills to be completed.    Today you received the following chemotherapy and/or immunotherapy agents: bleomycin.    To help prevent nausea and vomiting after your treatment, we encourage you to take your nausea medication as directed.  BELOW ARE SYMPTOMS THAT SHOULD BE REPORTED IMMEDIATELY: . *FEVER GREATER THAN 100.4 F (38 C) OR HIGHER . *CHILLS OR SWEATING . *NAUSEA AND VOMITING THAT IS NOT CONTROLLED WITH YOUR NAUSEA MEDICATION . *UNUSUAL SHORTNESS OF BREATH . *UNUSUAL BRUISING OR BLEEDING . *URINARY PROBLEMS (pain or burning when urinating, or frequent urination) . *BOWEL PROBLEMS (unusual diarrhea, constipation, pain near the anus) . TENDERNESS IN MOUTH AND THROAT WITH OR WITHOUT PRESENCE OF ULCERS (sore throat, sores in mouth, or a toothache) . UNUSUAL RASH, SWELLING OR PAIN  . UNUSUAL VAGINAL DISCHARGE OR ITCHING   Items with * indicate a potential emergency and should be followed up as soon as possible or go to the Emergency Department if any problems should occur.  Please show the CHEMOTHERAPY ALERT CARD or IMMUNOTHERAPY  ALERT CARD at check-in to the Emergency Department and triage nurse.  Should you have questions after your visit or need to cancel or reschedule your appointment, please contact [facility]  Dept: [phone]  and follow the prompts.  Office hours are 8:00 a.m. to 4:30 p.m. Monday - Friday. Please note that voicemails left after 4:00 p.m. may not be returned until the following business day.  We are closed weekends and major holidays. You have access to a nurse at all times for urgent questions. Please call the main number to the clinic Dept: [phone] and follow the prompts.   For any non-urgent questions, you may also contact your provider using MyChart. We now offer e-Visits for anyone 19 and older to request care online for non-urgent symptoms. For details visit [email or web address].   Also download the MyChart app! Go to the app store, search "MyChart", open the app, select [facility], and log in with your MyChart username and password.  Due to Covid, a mask is required upon entering the hospital/clinic. If you do not have a mask, one will be given to you upon arrival. For doctor visits, patients may have 1 support person aged 57 or older with them. For treatment visits, patients cannot have anyone with them due to current Covid guidelines and our immunocompromised population.

## 2020-06-12 NOTE — Progress Notes (Signed)
Per Dr Shadad, ok to treat with elevated HR.  °

## 2020-06-13 ENCOUNTER — Encounter: Payer: Self-pay | Admitting: Oncology

## 2020-06-14 ENCOUNTER — Encounter: Payer: Self-pay | Admitting: Oncology

## 2020-06-19 ENCOUNTER — Other Ambulatory Visit: Payer: Self-pay

## 2020-06-19 ENCOUNTER — Inpatient Hospital Stay: Payer: BC Managed Care – PPO

## 2020-06-19 ENCOUNTER — Inpatient Hospital Stay: Payer: BC Managed Care – PPO | Attending: Oncology

## 2020-06-19 ENCOUNTER — Other Ambulatory Visit: Payer: Self-pay | Admitting: Oncology

## 2020-06-19 ENCOUNTER — Telehealth: Payer: Self-pay

## 2020-06-19 VITALS — BP 122/73 | HR 97 | Temp 98.2°F | Resp 18 | Ht 72.0 in | Wt 211.5 lb

## 2020-06-19 DIAGNOSIS — Z923 Personal history of irradiation: Secondary | ICD-10-CM | POA: Insufficient documentation

## 2020-06-19 DIAGNOSIS — C6292 Malignant neoplasm of left testis, unspecified whether descended or undescended: Secondary | ICD-10-CM | POA: Diagnosis not present

## 2020-06-19 DIAGNOSIS — Z79899 Other long term (current) drug therapy: Secondary | ICD-10-CM | POA: Insufficient documentation

## 2020-06-19 DIAGNOSIS — Z5111 Encounter for antineoplastic chemotherapy: Secondary | ICD-10-CM | POA: Insufficient documentation

## 2020-06-19 DIAGNOSIS — Z9079 Acquired absence of other genital organ(s): Secondary | ICD-10-CM | POA: Diagnosis not present

## 2020-06-19 DIAGNOSIS — Z95828 Presence of other vascular implants and grafts: Secondary | ICD-10-CM

## 2020-06-19 LAB — CBC WITH DIFFERENTIAL (CANCER CENTER ONLY)
Abs Immature Granulocytes: 0.01 10*3/uL (ref 0.00–0.07)
Basophils Absolute: 0 10*3/uL (ref 0.0–0.1)
Basophils Relative: 2 %
Eosinophils Absolute: 0 10*3/uL (ref 0.0–0.5)
Eosinophils Relative: 1 %
HCT: 31.7 % — ABNORMAL LOW (ref 39.0–52.0)
Hemoglobin: 10.8 g/dL — ABNORMAL LOW (ref 13.0–17.0)
Immature Granulocytes: 1 %
Lymphocytes Relative: 30 %
Lymphs Abs: 0.6 10*3/uL — ABNORMAL LOW (ref 0.7–4.0)
MCH: 30.5 pg (ref 26.0–34.0)
MCHC: 34.1 g/dL (ref 30.0–36.0)
MCV: 89.5 fL (ref 80.0–100.0)
Monocytes Absolute: 0.2 10*3/uL (ref 0.1–1.0)
Monocytes Relative: 9 %
Neutro Abs: 1.1 10*3/uL — ABNORMAL LOW (ref 1.7–7.7)
Neutrophils Relative %: 57 %
Platelet Count: 80 10*3/uL — ABNORMAL LOW (ref 150–400)
RBC: 3.54 MIL/uL — ABNORMAL LOW (ref 4.22–5.81)
RDW: 15.6 % — ABNORMAL HIGH (ref 11.5–15.5)
WBC Count: 1.9 10*3/uL — ABNORMAL LOW (ref 4.0–10.5)
nRBC: 0 % (ref 0.0–0.2)

## 2020-06-19 LAB — CMP (CANCER CENTER ONLY)
ALT: 12 U/L (ref 0–44)
AST: 14 U/L — ABNORMAL LOW (ref 15–41)
Albumin: 3.9 g/dL (ref 3.5–5.0)
Alkaline Phosphatase: 53 U/L (ref 38–126)
Anion gap: 10 (ref 5–15)
BUN: 18 mg/dL (ref 6–20)
CO2: 21 mmol/L — ABNORMAL LOW (ref 22–32)
Calcium: 9.2 mg/dL (ref 8.9–10.3)
Chloride: 106 mmol/L (ref 98–111)
Creatinine: 0.84 mg/dL (ref 0.61–1.24)
GFR, Estimated: >60 mL/min (ref >60)
Glucose, Bld: 108 mg/dL — ABNORMAL HIGH (ref 70–99)
Potassium: 4.4 mmol/L (ref 3.5–5.1)
Sodium: 137 mmol/L (ref 135–145)
Total Bilirubin: 0.3 mg/dL (ref 0.3–1.2)
Total Protein: 7.1 g/dL (ref 6.5–8.1)

## 2020-06-19 MED ORDER — PROCHLORPERAZINE MALEATE 10 MG PO TABS
10.0000 mg | ORAL_TABLET | Freq: Once | ORAL | Status: AC
  Administered 2020-06-19: 10 mg via ORAL

## 2020-06-19 MED ORDER — SODIUM CHLORIDE 0.9 % IV SOLN
30.0000 [IU] | Freq: Once | INTRAVENOUS | Status: AC
Start: 2020-06-19 — End: 2020-06-19
  Administered 2020-06-19: 30 [IU] via INTRAVENOUS
  Filled 2020-06-19: qty 10

## 2020-06-19 MED ORDER — SODIUM CHLORIDE 0.9% FLUSH
10.0000 mL | INTRAVENOUS | Status: DC | PRN
  Administered 2020-06-19: 10 mL
  Filled 2020-06-19: qty 10

## 2020-06-19 MED ORDER — PROCHLORPERAZINE MALEATE 10 MG PO TABS
ORAL_TABLET | ORAL | Status: AC
  Filled 2020-06-19: qty 1

## 2020-06-19 MED ORDER — SODIUM CHLORIDE 0.9 % IV SOLN
Freq: Once | INTRAVENOUS | Status: AC
  Filled 2020-06-19: qty 250

## 2020-06-19 MED ORDER — HEPARIN SOD (PORK) LOCK FLUSH 100 UNIT/ML IV SOLN
500.0000 [IU] | Freq: Once | INTRAVENOUS | Status: AC | PRN
Start: 2020-06-19 — End: 2020-06-19
  Administered 2020-06-19: 500 [IU]
  Filled 2020-06-19: qty 5

## 2020-06-19 MED ORDER — SODIUM CHLORIDE 0.9% FLUSH
10.0000 mL | Freq: Once | INTRAVENOUS | Status: AC
Start: 2020-06-19 — End: 2020-06-19
  Administered 2020-06-19: 10 mL
  Filled 2020-06-19: qty 10

## 2020-06-19 NOTE — Telephone Encounter (Signed)
Pt contacted regarding MyChart message. Pt wanted to make sure CT that he has scheduled is for Chest , abd and pelvis. Pt states CT is supposed to be with oral and IV contrast. Currently CT is scheduled with oral contrast. Will forward information to Dr Alen Blew to clarify how this should be ordered.

## 2020-06-19 NOTE — Progress Notes (Signed)
Ok to treat w/ ANC 1.1 and Platelet 80 per Dr. Earlie Server

## 2020-06-19 NOTE — Patient Instructions (Signed)
[facility]  Discharge Instructions: Thank you for choosing [facility] to provide your oncology and hematology care.   If you have a lab appointment with the [facility], please go directly to the [facility] and check in at the registration area.   Wear comfortable clothing and clothing appropriate for easy access to any Portacath or PICC line.   We strive to give you quality time with your provider. You may need to reschedule your appointment if you arrive late (15 or more minutes).  Arriving late affects you and other patients whose appointments are after yours.  Also, if you miss three or more appointments without notifying the office, you may be dismissed from the clinic at the provider's discretion.      For prescription refill requests, have your pharmacy contact our office and allow 72 hours for refills to be completed.    Today you received the following chemotherapy and/or immunotherapy agents Bleomycin   To help prevent nausea and vomiting after your treatment, we encourage you to take your nausea medication as directed.  BELOW ARE SYMPTOMS THAT SHOULD BE REPORTED IMMEDIATELY: . *FEVER GREATER THAN 100.4 F (38 C) OR HIGHER . *CHILLS OR SWEATING . *NAUSEA AND VOMITING THAT IS NOT CONTROLLED WITH YOUR NAUSEA MEDICATION . *UNUSUAL SHORTNESS OF BREATH . *UNUSUAL BRUISING OR BLEEDING . *URINARY PROBLEMS (pain or burning when urinating, or frequent urination) . *BOWEL PROBLEMS (unusual diarrhea, constipation, pain near the anus) . TENDERNESS IN MOUTH AND THROAT WITH OR WITHOUT PRESENCE OF ULCERS (sore throat, sores in mouth, or a toothache) . UNUSUAL RASH, SWELLING OR PAIN  . UNUSUAL VAGINAL DISCHARGE OR ITCHING   Items with * indicate a potential emergency and should be followed up as soon as possible or go to the Emergency Department if any problems should occur.  Please show the CHEMOTHERAPY ALERT CARD or IMMUNOTHERAPY ALERT CARD  at check-in to the Emergency Department and triage nurse.  Should you have questions after your visit or need to cancel or reschedule your appointment, please contact [facility]  Dept: [phone]  and follow the prompts.  Office hours are 8:00 a.m. to 4:30 p.m. Monday - Friday. Please note that voicemails left after 4:00 p.m. may not be returned until the following business day.  We are closed weekends and major holidays. You have access to a nurse at all times for urgent questions. Please call the main number to the clinic Dept: [phone] and follow the prompts.   For any non-urgent questions, you may also contact your provider using MyChart. We now offer e-Visits for anyone 56 and older to request care online for non-urgent symptoms. For details visit [email or web address].   Also download the MyChart app! Go to the app store, search "MyChart", open the app, select [facility], and log in with your MyChart username and password.  Due to Covid, a mask is required upon entering the hospital/clinic. If you do not have a mask, one will be given to you upon arrival. For doctor visits, patients may have 1 support person aged 45 or older with them. For treatment visits, patients cannot have anyone with them due to current Covid guidelines and our immunocompromised population.

## 2020-06-20 ENCOUNTER — Encounter: Payer: Self-pay | Admitting: Oncology

## 2020-06-20 ENCOUNTER — Other Ambulatory Visit: Payer: Self-pay

## 2020-06-20 NOTE — Progress Notes (Signed)
error 

## 2020-06-21 ENCOUNTER — Encounter: Payer: Self-pay | Admitting: Oncology

## 2020-06-22 ENCOUNTER — Telehealth: Payer: Self-pay | Admitting: Oncology

## 2020-06-22 NOTE — Telephone Encounter (Signed)
Scheduled per los, patient has been called and notified of all upcoming appointments. 

## 2020-06-25 ENCOUNTER — Inpatient Hospital Stay: Payer: BC Managed Care – PPO

## 2020-06-25 ENCOUNTER — Other Ambulatory Visit: Payer: BC Managed Care – PPO

## 2020-06-25 ENCOUNTER — Other Ambulatory Visit: Payer: Self-pay

## 2020-06-25 DIAGNOSIS — Z95828 Presence of other vascular implants and grafts: Secondary | ICD-10-CM

## 2020-06-25 DIAGNOSIS — Z79899 Other long term (current) drug therapy: Secondary | ICD-10-CM | POA: Diagnosis not present

## 2020-06-25 DIAGNOSIS — C6292 Malignant neoplasm of left testis, unspecified whether descended or undescended: Secondary | ICD-10-CM

## 2020-06-25 DIAGNOSIS — Z5111 Encounter for antineoplastic chemotherapy: Secondary | ICD-10-CM | POA: Diagnosis not present

## 2020-06-25 DIAGNOSIS — Z9079 Acquired absence of other genital organ(s): Secondary | ICD-10-CM | POA: Diagnosis not present

## 2020-06-25 DIAGNOSIS — Z923 Personal history of irradiation: Secondary | ICD-10-CM | POA: Diagnosis not present

## 2020-06-25 LAB — CMP (CANCER CENTER ONLY)
ALT: 12 U/L (ref 0–44)
AST: 13 U/L — ABNORMAL LOW (ref 15–41)
Albumin: 3.8 g/dL (ref 3.5–5.0)
Alkaline Phosphatase: 52 U/L (ref 38–126)
Anion gap: 8 (ref 5–15)
BUN: 13 mg/dL (ref 6–20)
CO2: 26 mmol/L (ref 22–32)
Calcium: 9.6 mg/dL (ref 8.9–10.3)
Chloride: 104 mmol/L (ref 98–111)
Creatinine: 0.74 mg/dL (ref 0.61–1.24)
GFR, Estimated: >60 mL/min (ref >60)
Glucose, Bld: 89 mg/dL (ref 70–99)
Potassium: 4.5 mmol/L (ref 3.5–5.1)
Sodium: 138 mmol/L (ref 135–145)
Total Bilirubin: 0.3 mg/dL (ref 0.3–1.2)
Total Protein: 7 g/dL (ref 6.5–8.1)

## 2020-06-25 LAB — CBC WITH DIFFERENTIAL (CANCER CENTER ONLY)
Abs Immature Granulocytes: 0.02 10*3/uL (ref 0.00–0.07)
Basophils Absolute: 0 10*3/uL (ref 0.0–0.1)
Basophils Relative: 1 %
Eosinophils Absolute: 0 10*3/uL (ref 0.0–0.5)
Eosinophils Relative: 3 %
HCT: 33.8 % — ABNORMAL LOW (ref 39.0–52.0)
Hemoglobin: 11.6 g/dL — ABNORMAL LOW (ref 13.0–17.0)
Immature Granulocytes: 1 %
Lymphocytes Relative: 45 %
Lymphs Abs: 0.7 10*3/uL (ref 0.7–4.0)
MCH: 30.9 pg (ref 26.0–34.0)
MCHC: 34.3 g/dL (ref 30.0–36.0)
MCV: 89.9 fL (ref 80.0–100.0)
Monocytes Absolute: 0.5 10*3/uL (ref 0.1–1.0)
Monocytes Relative: 32 %
Neutro Abs: 0.3 10*3/uL — CL (ref 1.7–7.7)
Neutrophils Relative %: 18 %
Platelet Count: 208 10*3/uL (ref 150–400)
RBC: 3.76 MIL/uL — ABNORMAL LOW (ref 4.22–5.81)
RDW: 17.6 % — ABNORMAL HIGH (ref 11.5–15.5)
WBC Count: 1.6 10*3/uL — ABNORMAL LOW (ref 4.0–10.5)
nRBC: 0 % (ref 0.0–0.2)

## 2020-06-25 MED ORDER — HEPARIN SOD (PORK) LOCK FLUSH 100 UNIT/ML IV SOLN
500.0000 [IU] | Freq: Once | INTRAVENOUS | Status: AC
  Administered 2020-06-25: 500 [IU]
  Filled 2020-06-25: qty 5

## 2020-06-25 MED ORDER — SODIUM CHLORIDE 0.9% FLUSH
10.0000 mL | Freq: Once | INTRAVENOUS | Status: AC
  Administered 2020-06-25: 10 mL
  Filled 2020-06-25: qty 10

## 2020-06-25 NOTE — Progress Notes (Signed)
Pt requested to be deaccessed for CT scan appt tomorrow, prefers them to do peripheral.

## 2020-06-26 ENCOUNTER — Ambulatory Visit (INDEPENDENT_AMBULATORY_CARE_PROVIDER_SITE_OTHER): Payer: BC Managed Care – PPO

## 2020-06-26 DIAGNOSIS — K5750 Diverticulosis of both small and large intestine without perforation or abscess without bleeding: Secondary | ICD-10-CM | POA: Diagnosis not present

## 2020-06-26 DIAGNOSIS — I712 Thoracic aortic aneurysm, without rupture: Secondary | ICD-10-CM | POA: Diagnosis not present

## 2020-06-26 DIAGNOSIS — C6292 Malignant neoplasm of left testis, unspecified whether descended or undescended: Secondary | ICD-10-CM | POA: Diagnosis not present

## 2020-06-26 DIAGNOSIS — J984 Other disorders of lung: Secondary | ICD-10-CM | POA: Diagnosis not present

## 2020-06-26 DIAGNOSIS — I700 Atherosclerosis of aorta: Secondary | ICD-10-CM | POA: Diagnosis not present

## 2020-06-26 DIAGNOSIS — K7689 Other specified diseases of liver: Secondary | ICD-10-CM | POA: Diagnosis not present

## 2020-06-26 DIAGNOSIS — M4186 Other forms of scoliosis, lumbar region: Secondary | ICD-10-CM | POA: Diagnosis not present

## 2020-06-26 DIAGNOSIS — C6290 Malignant neoplasm of unspecified testis, unspecified whether descended or undescended: Secondary | ICD-10-CM | POA: Diagnosis not present

## 2020-06-26 MED ORDER — IOHEXOL 300 MG/ML  SOLN
100.0000 mL | Freq: Once | INTRAMUSCULAR | Status: AC | PRN
  Administered 2020-06-26: 100 mL via INTRAVENOUS

## 2020-06-28 ENCOUNTER — Encounter: Payer: Self-pay | Admitting: Oncology

## 2020-07-03 ENCOUNTER — Other Ambulatory Visit: Payer: BC Managed Care – PPO

## 2020-07-04 ENCOUNTER — Telehealth: Payer: Self-pay | Admitting: *Deleted

## 2020-07-04 ENCOUNTER — Encounter: Payer: BC Managed Care – PPO | Admitting: Surgery

## 2020-07-04 NOTE — Telephone Encounter (Signed)
Patient called - Has had palpitations since Sunday eve. Has had them before, usually for short time, never this long. No Chest pain. No shortness of breath. Completed last chemotherapy 2 weeks ago and did not know if palpitations might be related. Patient asks if he should see Dr. Alen Blew or f/u with his PCP Dr. Francesco Sor?  Call information routed to Dr. Alen Blew.

## 2020-07-04 NOTE — Telephone Encounter (Addendum)
Previous note deleted - information not correct

## 2020-07-04 NOTE — Telephone Encounter (Signed)
Contacted patient with Dr. Hazeline Junker response - "This is not related to chemo. I advise seeing his PCP." Patient verbalized understanding and said he will f/u with PCP

## 2020-07-04 NOTE — Telephone Encounter (Deleted)
Verbal order per Dr. Alen Blew for patient to receive 2 units PRBCs today. Orders entered with premed: Tylenol 650 mg PO; Benadryl 25 mg PO; Lasix 20 mg IV between units. Orders confirmed with [facility] W/[facility] BB.

## 2020-07-05 ENCOUNTER — Encounter: Payer: BC Managed Care – PPO | Admitting: Surgery

## 2020-07-05 DIAGNOSIS — C6290 Malignant neoplasm of unspecified testis, unspecified whether descended or undescended: Secondary | ICD-10-CM | POA: Diagnosis not present

## 2020-07-05 DIAGNOSIS — R002 Palpitations: Secondary | ICD-10-CM | POA: Diagnosis not present

## 2020-07-05 DIAGNOSIS — I499 Cardiac arrhythmia, unspecified: Secondary | ICD-10-CM | POA: Diagnosis not present

## 2020-07-06 ENCOUNTER — Telehealth (INDEPENDENT_AMBULATORY_CARE_PROVIDER_SITE_OTHER): Payer: BC Managed Care – PPO | Admitting: Surgery

## 2020-07-06 ENCOUNTER — Other Ambulatory Visit: Payer: Self-pay

## 2020-07-06 ENCOUNTER — Encounter: Payer: Self-pay | Admitting: Surgery

## 2020-07-06 DIAGNOSIS — I712 Thoracic aortic aneurysm, without rupture: Secondary | ICD-10-CM | POA: Diagnosis not present

## 2020-07-06 NOTE — Progress Notes (Signed)
Patient ID: Kevin Hess, male   DOB: 1970/08/12, 50 y.o.   MRN: [id number]      [address]       [place] [address]             [phone]     CARDIOTHORACIC SURGERY TELEPHONE VIRTUAL OFFICE NOTE  Referring Provider is Davis Gourd* Primary Cardiologist is None PCP is Sueanne Margarita, DO   HPI:  I spoke with Kevin Hess (DOB 06-Apr-1970 ) via telephone on 07/06/2020 at 2:49 PM and verified that I was speaking with the correct person using more than one form of identification.  We discussed the fact that I was contacting them from my office and he was located at home, as well as the reason(s) for conducting our visit virtually instead of in-person.  The patient expressed understanding the circumstances and agreed to proceed as described.   The patient is a 50 year old gentleman with a history of testicular seminoma status post right orchiectomy in 2009 with subsequent XRT and recent left orchiectomy for 2.8 cm left testicular mass on 05/31/2019.  Preoperative CT scan of the chest showed a 4.3 cm fusiform ascending aortic aneurysm.  There is no family history of connective tissue disorder, aortic aneurysm, or aortic dissection.  He has been undergoing chemotherapy under the direction of Dr. Alen Blew.  He continues to feel well.   Current Outpatient Medications  Medication Sig Dispense Refill   acetaminophen (TYLENOL) 500 MG tablet Take 1,000 mg by mouth every 6 (six) hours as needed for moderate pain.     Bacillus Coagulans-Inulin (ALIGN PREBIOTIC-PROBIOTIC PO) Take 1 tablet by mouth daily.     Cholecalciferol (VITAMIN D3) 5000 UNITS CAPS Take 5,000 Units by mouth daily. Takes Monday thru friday     cyclobenzaprine (FLEXERIL) 10 MG tablet Take 10 mg by mouth 3 (three) times daily as needed for muscle spasms. As needed     docusate sodium (COLACE) 100 MG capsule Take 100 mg by mouth 2 (two) times daily.     esomeprazole (NEXIUM) 20 MG capsule Take 20 mg by  mouth at bedtime.     fluticasone (FLONASE) 50 MCG/ACT nasal spray Place 2 sprays into both nostrils daily.     lidocaine-prilocaine (EMLA) cream Apply 1 application topically as needed. (Patient taking differently: Apply 1 application topically once as needed (port access).) 30 g 3   ondansetron (ZOFRAN ODT) 4 MG disintegrating tablet Take 1 tablet (4 mg total) by mouth every 8 (eight) hours as needed for nausea or vomiting. 20 tablet 0   prochlorperazine (COMPAZINE) 10 MG tablet TAKE 1 TABLET BY MOUTH EVERY 6 HOURS AS NEEDED FOR NAUSEA OR VOMITING. 30 tablet 0   promethazine (PHENERGAN) 25 MG tablet Take 1 tablet (25 mg total) by mouth every 6 (six) hours as needed for nausea or vomiting. 30 tablet 0   rosuvastatin (CRESTOR) 10 MG tablet Take 10 mg by mouth daily.     testosterone cypionate (DEPOTESTOSTERONE CYPIONATE) 200 MG/ML injection Inject 80 mg into the muscle See admin instructions. Every 9 days     traMADol (ULTRAM) 50 MG tablet Take 2 tablets (100 mg total) by mouth every 6 (six) hours as needed for severe pain. 30 tablet 0   zolpidem (AMBIEN) 5 MG tablet Take 1 tablet (5 mg total) by mouth at bedtime as needed for sleep. 30 tablet 0   No current facility-administered medications for this visit.     Diagnostic Tests:  Narrative & Impression  CLINICAL  DATA:  History of testicular cancer, restaging.   EXAM: CT CHEST, ABDOMEN, AND PELVIS WITH CONTRAST   TECHNIQUE: Multidetector CT imaging of the chest, abdomen and pelvis was performed following the standard protocol during bolus administration of intravenous contrast.   CONTRAST:  147mL OMNIPAQUE IOHEXOL 300 MG/ML  SOLN   COMPARISON:  None.   FINDINGS: CT CHEST FINDINGS   Cardiovascular: Right chest Port-A-Cath with tip at the superior cavoatrial junction. Normal size heart. No significant pericardial effusion/thickening. Unchanged size of the 4.3 cm ascending thoracic aortic aneurysm. No central pulmonary embolus.    Mediastinum/Nodes: No discrete thyroid nodularity. No pathologically enlarged mediastinal, hilar or axillary lymph nodes. No supraclavicular adenopathy. The trachea esophagus are grossly unremarkable.   Lungs/Pleura: Similar mild biapical pleuroparenchymal scarring. No suspicious pulmonary nodules or masses. No pleural effusion. No pneumothorax.   Musculoskeletal: Thoracic dextroscoliosis with thoracolumbar spinal fixation rods.   CT ABDOMEN PELVIS FINDINGS   Hepatobiliary: Stable subcentimeter cysts in the right lobe of the liver measuring up to 7 mm on image 65/2. No solid enhancing hepatic lesions. Gallbladder is unremarkable. No biliary ductal dilation.   Pancreas: Within normal limits.   Spleen: Within normal limits.   Adrenals/Urinary Tract: Similar mild nodular thickening of the left adrenal gland measuring up to 7 mm, favored benign. Right adrenal glands unremarkable. No hydronephrosis. No suspicious renal masses. Urinary bladder is grossly unremarkable for degree of distension.   Stomach/Bowel: Radiopaque enteric contrast traverses the rectum. Stomach is grossly unremarkable for degree of distension. No pathologic dilation of small bowel. The appendix and terminal ileum are within normal limits. Left-sided colonic diverticulosis without findings of acute diverticulitis.   Vascular/Lymphatic: Aortic atherosclerosis without aneurysmal dilation.   Decreased size of the index and non index retroperitoneal lymph nodes. Index lymph nodes are as follows:   -Left periaortic lymph node at the level of the renal hilum measures 4 mm on image 76/2 previously 2.8 cm.   -Left periaortic lymph node just inferior to the left kidney now measures 4 mm on image 81/2 previously 2 cm. No new or enlarging suspicious abdominal or pelvic lymph nodes.   Reproductive: Status post left orchiectomy. Dystrophic prostatic calcifications.   Other: No abdominopelvic ascites.    Musculoskeletal: Levoscoliosis of the lumbar spine with thoracolumbar fixation hardware. Multilevel degenerative changes spine. No aggressive lytic or blastic lesion of bone.   IMPRESSION: 1. Significant interval decrease in size of the index and non index retroperitoneal lymph nodes, consistent with excellent treatment response. 2. No evidence of new or progressive disease in the chest abdomen or pelvis 3. Unchanged size of the 4.3 cm ascending thoracic aortic aneurysm. Recommend annual imaging followup by CTA or MRA. However, currently continue surveillance on oncologic imaging is sufficient in this patient. This recommendation follows 2010 ACCF/AHA/AATS/ACR/ASA/SCA/SCAI/SIR/STS/SVM Guidelines for the diagnosis and Management of Patients with Thoracic Aortic Disease. Circulation. 2010; 121: N361-W431. Aortic aneurysm NOS (ICD10-I71.9) 4. Left-sided colonic diverticulosis without findings of acute diverticulitis. 5. Aortic atherosclerosis (ICD10-I70.0).     Electronically Signed   By: Dahlia Bailiff MD   On: 06/26/2020 12:21     Impression:  He has a stable 4.3 cm fusiform ascending aortic aneurysm that has not changed dating back to 2010.  This is well below the surgical threshold of 5.5 cm.  I reviewed the CT results with him and answered his questions.  I stressed the importance of continued good blood pressure control in preventing further enlargement and acute aortic dissection.  I advised him against doing  any heavy weight lifting of more than 35 pounds that might require use of a Valsalva maneuver.  I also cautioned him against taking quinolone antibiotics that have been associated with enlargement of aortic aneurysms.  He is going to be followed long-term by Dr. Alen Blew and will likely have fairly frequent CT scans for follow-up.  Since his aneurysm is small and stable I think he can be followed on those periodic CT scans.  If there are any significant changes then I can see  him back to discuss that.  Plan:  He will continue to follow-up with Dr. Alen Blew and will have periodic CT scans for cancer surveillance.  He will return to see me if there is any change in the size of his small stable ascending aortic aneurysm.    I discussed limitations of evaluation and management via telephone.  The patient was advised to call back for repeat telephone consultation or to seek an in-person evaluation if questions arise or the patient's clinical condition changes in any significant manner.  I spent 10 minutes of non-face-to-face time during the conduct of this telephone virtual office consultation, including pre-visit review of the patient's records and direct conversation with the patient.     Gaye Pollack MD 07/06/2020 2:49 PM

## 2020-07-10 ENCOUNTER — Other Ambulatory Visit: Payer: Self-pay

## 2020-07-10 ENCOUNTER — Encounter: Payer: Self-pay | Admitting: Oncology

## 2020-07-10 ENCOUNTER — Inpatient Hospital Stay (HOSPITAL_BASED_OUTPATIENT_CLINIC_OR_DEPARTMENT_OTHER): Payer: BC Managed Care – PPO | Admitting: Oncology

## 2020-07-10 VITALS — BP 129/79 | HR 87 | Temp 97.7°F | Resp 17 | Ht 72.0 in | Wt 214.6 lb

## 2020-07-10 DIAGNOSIS — C6292 Malignant neoplasm of left testis, unspecified whether descended or undescended: Secondary | ICD-10-CM | POA: Diagnosis not present

## 2020-07-10 DIAGNOSIS — Z923 Personal history of irradiation: Secondary | ICD-10-CM | POA: Diagnosis not present

## 2020-07-10 DIAGNOSIS — Z9079 Acquired absence of other genital organ(s): Secondary | ICD-10-CM | POA: Diagnosis not present

## 2020-07-10 DIAGNOSIS — Z79899 Other long term (current) drug therapy: Secondary | ICD-10-CM | POA: Diagnosis not present

## 2020-07-10 DIAGNOSIS — Z5111 Encounter for antineoplastic chemotherapy: Secondary | ICD-10-CM | POA: Diagnosis not present

## 2020-07-10 NOTE — Progress Notes (Signed)
Primary Physician/Referring:  Sueanne Margarita, DO  Patient ID: Kevin Hess, male    DOB: [birth year], 50 y.o.   MRN: [id number]  Chief Complaint  Patient presents with   Palpitations   New Patient (Initial Visit)   HPI:    Kevin Hess  is a 50 y.o. 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  He has history of hypercholesterolemia, ascending thoracic aortic aneurysm (4.3 cm per chest CT 06/2020 stable since 2010), metastatic seminoma, stage IIb (chemotherapy 04/23/2020 - 06/19/2020).  Patient also has history of colon polyps, seasonal rhinitis, vitamin D deficiency, and scoliosis.  Patient's family history significant for father with MI at age 71.  Patient states that prior to starting chemotherapy he was very active, however he has become very deconditioned since initiation of chemotherapy in April of this year. He did see cardiology in 2010 for chest pain and reports that he had cardiac catheterization which revealed no significant coronary artery disease chest pain was determined to be noncardiac.  Patient is now referred to our office for evaluation and management of palpitations.  10 days ago patient noticed "fluttering in his chest" and checked his pulse which she noted to be irregular.  Over the last 10 days patient has had only intermittent brief episodes of relief from palpitations.  He notices activity seems to make palpitations worse.  He denies associated symptoms.  Denies chest pain, dyspnea, dizziness, syncope, near syncope.  Around the same time patient's palpitations started he was weaning himself off of Nexium, therefore patient resumed Nexium, however there was no change in palpitations so he has since discontinued this.  He is also cut back on caffeine intake (previously drank a single cup of coffee per day), however reduced caffeine has been no change in patient's palpitation symptoms.  Patient does note he had a single episode of  palpitations at age 50 lasting several hours, but has had no recurrence since then until now.  Patient denies excessive caffeine intake, energy drinks, soda, illicit drug use. Chemotherapy treatment: BEP (bleomycin, etoposide, cisplatin).    Past Medical History:  Diagnosis Date   Allergy    Chronic kidney disease 2020   kidney stones    Complication of anesthesia    History of blood transfusion 1986   History of kidney stones    History of radiation therapy 2009   right testicular cancer   Hyperlipidemia    Malignant neoplasm of other and unspecified testis 2009   testicular right 2009, left 2021    Mononucleosis    PONV (postoperative nausea and vomiting)    Scoliosis    Past Surgical History:  Procedure Laterality Date   Calcium and 1997   due to scoliosis 1986 Thoracic fusion 1997 thoracici hardware removal   EXTRACORPOREAL SHOCK WAVE LITHOTRIPSY  04/2018   IR IMAGING GUIDED PORT INSERTION  04/13/2020   left vastectomy  2011   with local   ORCHIECTOMY Left 05/31/2019   Procedure: ORCHIECTOMY, RADICAL;  Surgeon: Ceasar Mons, MD;  Location: [facility];  Service: Urology;  Laterality: Left;   right radical ocrhiectomy  2009   UPPER GASTROINTESTINAL ENDOSCOPY     UPPER GI ENDOSCOPY  2010   WISDOM TOOTH EXTRACTION     x 1 with Versed    Family History  Problem Relation Age of Onset   Colitis Mother    Diverticulitis Mother    Hypertension Mother    CAD Father  Hypertension Father    Colitis Brother    Diverticulitis Brother    Colon polyps Brother    Esophageal cancer Neg Hx    Stomach cancer Neg Hx    Rectal cancer Neg Hx    Colon cancer Neg Hx     Social History   Tobacco Use   Smoking status: Never   Smokeless tobacco: Never  Substance Use Topics   Alcohol use: Not Currently   Marital Status: Married   ROS  Review of Systems  Constitutional: Positive for malaise/fatigue (since starting chemo in 04/2020). Negative  for weight gain.  Cardiovascular:  Positive for palpitations. Negative for chest pain, claudication, leg swelling, near-syncope, orthopnea, paroxysmal nocturnal dyspnea and syncope.  Respiratory:  Negative for shortness of breath.   Neurological:  Negative for dizziness.   Objective  Blood pressure 126/80, pulse (!) 108, temperature 98.7 F (37.1 C), temperature source Temporal, resp. rate 16, height 6' (1.829 m), weight 213 lb (96.6 kg), SpO2 98 %.  Vitals with BMI 07/11/2020 07/10/2020 06/19/2020  Height 6' 0" 6' 0" 6' 0"  Weight 213 lbs 214 lbs 10 oz 211 lbs 8 oz  BMI 28.88 09.4 70.96  Systolic 283 662 947  Diastolic 80 79 73  Pulse 654 87 97      Physical Exam Vitals reviewed.  HENT:     Head: Normocephalic and atraumatic.  Cardiovascular:     Rate and Rhythm: Normal rate. Rhythm regularly irregular. FrequentExtrasystoles (every 4th beat) are present.    Pulses: Intact distal pulses.     Heart sounds: S1 normal and S2 normal. No murmur heard.   No gallop.  Pulmonary:     Effort: Pulmonary effort is normal. No respiratory distress.     Breath sounds: No wheezing, rhonchi or rales.  Musculoskeletal:     Right lower leg: No edema.     Left lower leg: No edema.  Neurological:     Mental Status: He is alert.    Laboratory examination:   Recent Labs    08/30/19 0900 04/23/20 0750 06/12/20 0905 06/19/20 0808 06/25/20 0948  NA 139   < > 137 137 138  K 4.6   < > 4.8 4.4 4.5  CL 105   < > 100 106 104  CO2 26   < > 27 21* 26  GLUCOSE 106*   < > 113* 108* 89  BUN 9   < > _0 CREATININE 0.95   < > 0.73 0.84 0.74  CALCIUM 9.9   < > 9.2 9.2 9.6  GFRNONAA >60   < > >60 >60 >60  GFRAA >60  --   --   --   --    < > = values in this interval not displayed.   estimated creatinine clearance is 133.1 mL/min (by C-G formula based on SCr of 0.74 mg/dL).  CMP Latest Ref Rng & Units 06/25/2020 06/19/2020 06/12/2020  Glucose 70 - 99 mg/dL 89 108(H) 113(H)  BUN 6 - 20 mg/dL _1 Creatinine 0.61 - 1.24 mg/dL 0.74 0.84 0.73  Sodium 135 - 145 mmol/L 138 137 137  Potassium 3.5 - 5.1 mmol/L 4.5 4.4 4.8  Chloride 98 - 111 mmol/L 104 106 100  CO2 22 - 32 mmol/L 26 21(L) 27  Calcium 8.9 - 10.3 mg/dL 9.6 9.2 9.2  Total Protein 6.5 - 8.1 g/dL 7.0 7.1 6.4(L)  Total Bilirubin 0.3 - 1.2 mg/dL 0.3 0.3 0.3  Alkaline Phos 38 -  126 U/L 52 53 47  AST 15 - 41 U/L 13(L) 14(L) 10(L)  ALT 0 - 44 U/L _0 CBC Latest Ref Rng & Units 06/25/2020 06/19/2020 06/12/2020  WBC 4.0 - 10.5 K/uL 1.6(L) 1.9(L) 2.2(L)  Hemoglobin 13.0 - 17.0 g/dL 11.6(L) 10.8(L) 10.7(L)  Hematocrit 39.0 - 52.0 % 33.8(L) 31.7(L) 30.3(L)  Platelets 150 - 400 K/uL 208 80(L) 268    Lipid Panel No results for input(s): CHOL, TRIG, LDLCALC, VLDL, HDL, CHOLHDL, LDLDIRECT in the last 8760 hours.  HEMOGLOBIN A1C No results found for: HGBA1C, MPG TSH No results for input(s): TSH in the last 8760 hours.  External labs:   07/05/2020: Glucose 107, BUN 15, creatinine 0.8, GFR 102, sodium 138, potassium 4.9, AST 14, ALT 9, alk phos 54 Hemoglobin 13.2, hematocrit 38.3, MCV 93, platelet 223 TSH 2.1 Magnesium 2.2  Allergies   Allergies  Allergen Reactions   Penicillins     Rash age 79    Medications Prior to Visit:   Outpatient Medications Prior to Visit  Medication Sig Dispense Refill   acetaminophen (TYLENOL) 500 MG tablet Take 1,000 mg by mouth every 6 (six) hours as needed for moderate pain.     Bacillus Coagulans-Inulin (ALIGN PREBIOTIC-PROBIOTIC PO) Take 1 tablet by mouth daily.     cetirizine (ZYRTEC) 10 MG tablet Take 10 mg by mouth daily.     Cholecalciferol (VITAMIN D3) 5000 UNITS CAPS Take 5,000 Units by mouth daily. Takes Monday thru friday     cyclobenzaprine (FLEXERIL) 10 MG tablet Take 10 mg by mouth 3 (three) times daily as needed for muscle spasms. As needed     esomeprazole (NEXIUM) 20 MG capsule Take 20 mg by mouth at bedtime.     fluticasone (FLONASE) 50 MCG/ACT nasal spray Place 2  sprays into both nostrils daily.     rosuvastatin (CRESTOR) 10 MG tablet Take 10 mg by mouth daily.     testosterone cypionate (DEPOTESTOSTERONE CYPIONATE) 200 MG/ML injection Inject 80 mg into the muscle See admin instructions. Every 9 days     docusate sodium (COLACE) 100 MG capsule Take 100 mg by mouth 2 (two) times daily.     lidocaine-prilocaine (EMLA) cream Apply 1 application topically as needed. (Patient taking differently: Apply 1 application topically once as needed (port access).) 30 g 3   ondansetron (ZOFRAN ODT) 4 MG disintegrating tablet Take 1 tablet (4 mg total) by mouth every 8 (eight) hours as needed for nausea or vomiting. 20 tablet 0   prochlorperazine (COMPAZINE) 10 MG tablet TAKE 1 TABLET BY MOUTH EVERY 6 HOURS AS NEEDED FOR NAUSEA OR VOMITING. 30 tablet 0   promethazine (PHENERGAN) 25 MG tablet Take 1 tablet (25 mg total) by mouth every 6 (six) hours as needed for nausea or vomiting. 30 tablet 0   traMADol (ULTRAM) 50 MG tablet Take 2 tablets (100 mg total) by mouth every 6 (six) hours as needed for severe pain. 30 tablet 0   zolpidem (AMBIEN) 5 MG tablet Take 1 tablet (5 mg total) by mouth at bedtime as needed for sleep. 30 tablet 0   No facility-administered medications prior to visit.   Final Medications at End of Visit    Current Meds  Medication Sig   acetaminophen (TYLENOL) 500 MG tablet Take 1,000 mg by mouth every 6 (six) hours as needed for moderate pain.   Bacillus Coagulans-Inulin (ALIGN PREBIOTIC-PROBIOTIC PO) Take 1 tablet by mouth daily.   cetirizine (ZYRTEC) 10 MG tablet Take 10  mg by mouth daily.   Cholecalciferol (VITAMIN D3) 5000 UNITS CAPS Take 5,000 Units by mouth daily. Takes Monday thru friday   cyclobenzaprine (FLEXERIL) 10 MG tablet Take 10 mg by mouth 3 (three) times daily as needed for muscle spasms. As needed   esomeprazole (NEXIUM) 20 MG capsule Take 20 mg by mouth at bedtime.   fluticasone (FLONASE) 50 MCG/ACT nasal spray Place 2 sprays into  both nostrils daily.   metoprolol succinate (TOPROL-XL) 25 MG 24 hr tablet Take 0.5 tablets (12.5 mg total) by mouth daily. Take with or immediately following a meal.   rosuvastatin (CRESTOR) 10 MG tablet Take 10 mg by mouth daily.   testosterone cypionate (DEPOTESTOSTERONE CYPIONATE) 200 MG/ML injection Inject 80 mg into the muscle See admin instructions. Every 9 days   Radiology:   No results found.  Cardiac Studies:   None   EKG:   07/11/2020: Sinus rhythm with frequent PACs every fourth beat at a rate of 105 bpm.  Normal axis.  Incomplete right bundle branch block.  No evidence of ischemia or underlying injury pattern.  External EKG date unknown: Sinus rhythm with 2 PACs at a rate of 88 bpm.  Normal axis.  Poor R wave progression, cannot exclude anteroseptal infarct old.  No evidence of ischemia or underlying injury pattern.  Assessment     ICD-10-CM   1. Palpitations  R00.2 EKG 12-Lead    PCV MYOCARDIAL PERFUSION WO LEXISCAN    2. Decreased exercise tolerance  R68.89 PCV ECHOCARDIOGRAM COMPLETE    PCV MYOCARDIAL PERFUSION WO LEXISCAN    3. Other fatigue  R53.83 PCV ECHOCARDIOGRAM COMPLETE    PCV MYOCARDIAL PERFUSION WO LEXISCAN       Medications Discontinued During This Encounter  Medication Reason   docusate sodium (COLACE) 100 MG capsule Error   lidocaine-prilocaine (EMLA) cream Error   ondansetron (ZOFRAN ODT) 4 MG disintegrating tablet Error   prochlorperazine (COMPAZINE) 10 MG tablet Error   promethazine (PHENERGAN) 25 MG tablet Error   traMADol (ULTRAM) 50 MG tablet Error   zolpidem (AMBIEN) 5 MG tablet Error    Meds ordered this encounter  Medications   metoprolol succinate (TOPROL-XL) 25 MG 24 hr tablet    Sig: Take 0.5 tablets (12.5 mg total) by mouth daily. Take with or immediately following a meal.    Dispense:  15 tablet    Refill:  3    Recommendations:   Kevin Hess is a 50 y.o. 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  He has history of hypercholesterolemia, ascending thoracic aortic aneurysm (4.3 cm per chest CT 06/2020 stable since 2010), metastatic seminoma, stage IIb (chemotherapy 04/23/2020 - 06/19/2020).  Patient also has history of colon polyps, seasonal rhinitis, vitamin D deficiency, and scoliosis.  Patient's family history significant for father with MI at age 69. Patient is referred to our office for evaluation of palpitations.   EKG today notes frequent PACs every fourth beat.  This is likely the underlying etiology of patient's palpitation symptoms.  We will start patient on low-dose beta-blocker for symptom management to suppress PACs.  Start metoprolol succinate 12.5 mg once daily.  Frequency of PACs would recommend further ischemic evaluation to rule out underlying ischemic etiology of PACs, will obtain nuclear stress test given family history of CAD and patient's history of hypercholesterolemia.  We will also obtain echocardiogram to eluate for underlying structural etiology of PACs.  Cannot exclude underlying cardiac arrhythmia as etiology of patient's  symptoms, can therefore consider ambulatory cardiac monitor at next visit, but will first obtain echo and stress test and follow up on initiation of metoprolol.   Follow-up in 4 weeks, sooner if needed, for palpitations and results of cardiac testing.   Alethia Berthold, PA-C 07/12/2020, 11:44 AM Office: [phone]

## 2020-07-10 NOTE — Progress Notes (Signed)
Hematology and Oncology   Kevin Hess [id number] Feb 23, 1970 50 y.o. 07/10/2020 3:18 PM Kevin Nutting, DO    Provider's location: Office    Principle Diagnosis: 50 year old man with T2N0, stage IIB left testicular seminoma diagnosed in May 2021.    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        Prior Therapy:  Status post right radical orchiectomy with the pathology showed a 2.2 cm seminoma in August 2009.   He is status post adjuvant radiation therapy to the pelvis with a total of 26 Gy in 16 fractions concluded in October 2009. He is status post radical orchiectomy on the left completed on Jun 10, 2019.  The final pathology showed a 2.5 cm pure seminoma that is organ confined. BEP chemotherapy started on April 23, 2020.  He completed 3 cycles of therapy on June 19, 2020.     Current therapy: Active surveillance.   Interim History: [name] presents today for a follow-up visit.  Since the last visit, he completed the third cycle of BEP chemotherapy without any major complaints.  He does have residual fatigue and tiredness associated with completion of chemotherapy.  He has also reported some palpitations and has upcoming evaluation by cardiology.  He denies any nausea, vomiting or headaches.  He denies any worsening neuropathy.   Medications: Unchanged on review. Current Outpatient Medications  Medication Sig Dispense Refill   acetaminophen (TYLENOL) 500 MG tablet Take 1,000 mg by mouth every 6 (six) hours as needed for moderate pain.     Bacillus Coagulans-Inulin (ALIGN PREBIOTIC-PROBIOTIC PO) Take 1 tablet by mouth daily.     Cholecalciferol (VITAMIN D3) 5000 UNITS CAPS Take 5,000 Units by mouth daily. Takes Monday thru friday     cyclobenzaprine (FLEXERIL) 10 MG tablet Take 10 mg by mouth 3 (three) times daily as needed for muscle spasms. As needed     docusate sodium (COLACE) 100 MG capsule Take 100 mg by mouth 2  (two) times daily.     esomeprazole (NEXIUM) 20 MG capsule Take 20 mg by mouth at bedtime.     fluticasone (FLONASE) 50 MCG/ACT nasal spray Place 2 sprays into both nostrils daily.     lidocaine-prilocaine (EMLA) cream Apply 1 application topically as needed. (Patient taking differently: Apply 1 application topically once as needed (port access).) 30 g 3   ondansetron (ZOFRAN ODT) 4 MG disintegrating tablet Take 1 tablet (4 mg total) by mouth every 8 (eight) hours as needed for nausea or vomiting. 20 tablet 0   prochlorperazine (COMPAZINE) 10 MG tablet TAKE 1 TABLET BY MOUTH EVERY 6 HOURS AS NEEDED FOR NAUSEA OR VOMITING. 30 tablet 0   promethazine (PHENERGAN) 25 MG tablet Take 1 tablet (25 mg total) by mouth every 6 (six) hours as needed for nausea or vomiting. 30 tablet 0   rosuvastatin (CRESTOR) 10 MG tablet Take 10 mg by mouth daily.     testosterone cypionate (DEPOTESTOSTERONE CYPIONATE) 200 MG/ML injection Inject 80 mg into the muscle See admin instructions. Every 9 days     traMADol (ULTRAM) 50 MG tablet Take 2 tablets (100 mg total) by mouth every 6 (six) hours as needed for severe pain. 30 tablet 0   zolpidem (AMBIEN) 5 MG tablet Take 1 tablet (5 mg total) by mouth at bedtime as needed for sleep. 30 tablet 0   No current facility-administered medications for this visit.     Allergies:  Allergies  Allergen Reactions  Penicillins     Rash age 45    Physical exam    Blood pressure 129/79, pulse 87, temperature 97.7 F (36.5 C), temperature source Tympanic, resp. rate 17, height 6' (1.829 m), weight 214 lb 9.6 oz (97.3 kg), SpO2 100 %.   ECOG 0    General appearance: Alert, awake without any distress. Head: Atraumatic without abnormalities Oropharynx: Without any thrush or ulcers. Eyes: No scleral icterus. Lymph nodes: No lymphadenopathy noted in the cervical, supraclavicular, or axillary nodes Heart:regular rate and rhythm, without any murmurs or gallops.   Lung: Clear  to auscultation without any rhonchi, wheezes or dullness to percussion. Abdomin: Soft, nontender without any shifting dullness or ascites. Musculoskeletal: No clubbing or cyanosis. Neurological: No motor or sensory deficits. Skin: No rashes or lesions.     Lab Results: Lab Results  Component Value Date   WBC 1.6 (L) 06/25/2020   HGB 11.6 (L) 06/25/2020   HCT 33.8 (L) 06/25/2020   MCV 89.9 06/25/2020   PLT 208 06/25/2020     Chemistry      Component Value Date/Time   NA 138 06/25/2020 0948   NA 140 10/09/2016 1428   K 4.5 06/25/2020 0948   K 4.3 10/09/2016 1428   CL 104 06/25/2020 0948   CL 104 03/02/2012 1043   CO2 26 06/25/2020 0948   CO2 27 10/09/2016 1428   BUN 13 06/25/2020 0948   BUN 10.3 10/09/2016 1428   CREATININE 0.74 06/25/2020 0948   CREATININE 0.8 10/09/2016 1428      Component Value Date/Time   CALCIUM 9.6 06/25/2020 0948   CALCIUM 10.2 10/09/2016 1428   ALKPHOS 52 06/25/2020 0948   ALKPHOS 55 10/09/2016 1428   AST 13 (L) 06/25/2020 0948   AST 19 10/09/2016 1428   ALT 12 06/25/2020 0948   ALT 17 10/09/2016 1428   BILITOT 0.3 06/25/2020 0948   BILITOT 0.42 10/09/2016 1428     IMPRESSION: 1. Significant interval decrease in size of the index and non index retroperitoneal lymph nodes, consistent with excellent treatment response. 2. No evidence of new or progressive disease in the chest abdomen or pelvis 3. Unchanged size of the 4.3 cm ascending thoracic aortic aneurysm. Recommend annual imaging followup by CTA or MRA. However, currently continue surveillance on oncologic imaging is sufficient in this patient. This recommendation follows 2010 ACCF/AHA/AATS/ACR/ASA/SCA/SCAI/SIR/STS/SVM Guidelines for the diagnosis and Management of Patients with Thoracic Aortic Disease. Circulation. 2010; 121: D638-V564. Aortic aneurysm NOS (ICD10-I71.9) 4. Left-sided colonic diverticulosis without findings of acute diverticulitis. 5. Aortic atherosclerosis  (ICD10-I70.0).    Impression and Plan:   50 year old man with:   1.    T2N0 stage IIB left testicular seminoma presented with left retroperitoneal lymph node in March 2022.      His disease status was updated at this time and treatment options were discussed.  CT scan obtained on June 26, 2020 was personally reviewed and showed excellent response to therapy.  He has 4 mm residual lymph node which is indicative of complete response at this time without any additional treatment.  I see no indication for pet imaging or surgical intervention given the residual lymph node size.  Pelvis therapy with her will be in the form of RPLND, radiation or further chemotherapy would be deferred unless disease relapse is detected.  I have recommended continued active surveillance at this time and repeat imaging studies in 3 months subsequently every 3 months the first year and 6 months after that for the first 2  years.   2.  Right testicular seminoma: Status post orchiectomy without any evidence of residual disease.   3.    Pulmonary toxicity screening: No delayed toxicity noted at this time.  I will update his pulmonary function test in the near future.   4.    IV access: Port-A-Cath will remain in place and will be flushed periodically.   5.  Follow-up: In 3 months for repeat evaluation.  We will continue to repeat imaging studies every 3 months the first year     30  minutes were dedicated to this encounter.  Time was spent on reviewing laboratory data, imaging studies, disease status update and future plan of care discussion.    Zola Button, MD 07/10/2020 3:18 PM

## 2020-07-11 ENCOUNTER — Encounter: Payer: Self-pay | Admitting: Student

## 2020-07-11 ENCOUNTER — Ambulatory Visit: Payer: BC Managed Care – PPO | Admitting: Student

## 2020-07-11 VITALS — BP 126/80 | HR 108 | Temp 98.7°F | Resp 16 | Ht 72.0 in | Wt 213.0 lb

## 2020-07-11 DIAGNOSIS — R6889 Other general symptoms and signs: Secondary | ICD-10-CM

## 2020-07-11 DIAGNOSIS — R002 Palpitations: Secondary | ICD-10-CM | POA: Diagnosis not present

## 2020-07-11 DIAGNOSIS — R5383 Other fatigue: Secondary | ICD-10-CM

## 2020-07-11 MED ORDER — METOPROLOL SUCCINATE ER 25 MG PO TB24
12.5000 mg | ORAL_TABLET | Freq: Every day | ORAL | 3 refills | Status: DC
Start: 2020-07-11 — End: 2020-08-08

## 2020-07-24 DIAGNOSIS — C6290 Malignant neoplasm of unspecified testis, unspecified whether descended or undescended: Secondary | ICD-10-CM | POA: Diagnosis not present

## 2020-07-24 DIAGNOSIS — Z7189 Other specified counseling: Secondary | ICD-10-CM | POA: Diagnosis not present

## 2020-07-30 ENCOUNTER — Other Ambulatory Visit: Payer: Self-pay

## 2020-07-30 ENCOUNTER — Ambulatory Visit: Payer: BC Managed Care – PPO

## 2020-07-30 DIAGNOSIS — R6889 Other general symptoms and signs: Secondary | ICD-10-CM | POA: Diagnosis not present

## 2020-07-30 DIAGNOSIS — R5383 Other fatigue: Secondary | ICD-10-CM

## 2020-07-30 DIAGNOSIS — R002 Palpitations: Secondary | ICD-10-CM

## 2020-07-30 DIAGNOSIS — Z0189 Encounter for other specified special examinations: Secondary | ICD-10-CM | POA: Diagnosis not present

## 2020-08-02 ENCOUNTER — Telehealth: Payer: Self-pay

## 2020-08-02 NOTE — Telephone Encounter (Signed)
He may stop metoprolol and switch to diltiazem ER 120 mg once daily. If patient is open to this can send script/make changes in med list. If patient would prefer we can readdress at upcoming office visit.

## 2020-08-02 NOTE — Telephone Encounter (Signed)
Pt called and asked if he can wean off his metoprolol , he doesn't feel like its helping and has gained weight. He is taking 25 mg daily. He has f/u with you 08/08/20

## 2020-08-03 NOTE — Telephone Encounter (Signed)
Pt will wait & discuss when he sees you next week

## 2020-08-07 NOTE — Progress Notes (Signed)
Primary Physician/Referring:  Sueanne Margarita, DO  Patient ID: Kevin Hess, male    DOB: 19-Nov-1970, 50 y.o.   MRN: [id number]  Chief Complaint  Patient presents with   Palpitations   Follow-up    4 week   Results    echo   HPI:    Kevin Hess  is a 50 y.o. 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  He has history of hypercholesterolemia, ascending thoracic aortic aneurysm (4.3 cm per chest CT 06/2020 stable since 2010), metastatic seminoma, stage IIb (chemotherapy 04/23/2020 - 06/19/2020).  Patient also has history of colon polyps, seasonal rhinitis, vitamin D deficiency, and scoliosis.  Patient's family history significant for father with MI at age 40.  Patient presents for 4-week follow-up of palpitations and results of cardiac testing.  At last visit added metoprolol succinate 12.5 mg daily as well as ordered echocardiogram and stress test.  Patient states that since last visit his fatigue has significantly improved and he is having more energy.  He is presently walking 30 minutes 3 days/week without issue.  He continues to have palpitations nightly, worse when lying on his left side, does not have palpitations throughout the day and the symptoms do not keep him awake at night.  He notes that metoprolol has not changed his palpitation symptoms and in fact he experienced fatigue when starting metoprolol, however this has improved.  Patient diligently monitors his heart rate and has not noticed any tachycardic episodes.  He is followed by Dr. Cyndia Bent for surveillance of 4.3 cm fusiform ascending aortic aneurysm.   Patient denies excessive caffeine intake, energy drinks, soda, illicit drug use. Chemotherapy treatment: BEP (bleomycin, etoposide, cisplatin).    Past Medical History:  Diagnosis Date   Allergy    Chronic kidney disease 2020   kidney stones    Complication of anesthesia    History of blood transfusion 1986   History of kidney  stones    History of radiation therapy 2009   right testicular cancer   Hyperlipidemia    Malignant neoplasm of other and unspecified testis 2009   testicular right 2009, left 2021    Mononucleosis    PONV (postoperative nausea and vomiting)    Scoliosis    Past Surgical History:  Procedure Laterality Date   Draper and 1997   due to scoliosis 1986 Thoracic fusion 1997 thoracici hardware removal   EXTRACORPOREAL SHOCK WAVE LITHOTRIPSY  04/2018   IR IMAGING GUIDED PORT INSERTION  04/13/2020   left vastectomy  2011   with local   ORCHIECTOMY Left 05/31/2019   Procedure: ORCHIECTOMY, RADICAL;  Surgeon: Ceasar Mons, MD;  Location: [facility];  Service: Urology;  Laterality: Left;   right radical ocrhiectomy  2009   UPPER GASTROINTESTINAL ENDOSCOPY     UPPER GI ENDOSCOPY  2010   WISDOM TOOTH EXTRACTION     x 1 with Versed    Family History  Problem Relation Age of Onset   Colitis Mother    Diverticulitis Mother    Hypertension Mother    CAD Father    Hypertension Father    Colitis Brother    Diverticulitis Brother    Colon polyps Brother    Esophageal cancer Neg Hx    Stomach cancer Neg Hx    Rectal cancer Neg Hx    Colon cancer Neg Hx     Social History   Tobacco Use   Smoking status:  Never   Smokeless tobacco: Never  Substance Use Topics   Alcohol use: Not Currently   Marital Status: Married   ROS  Review of Systems  Constitutional: Positive for malaise/fatigue (since starting chemo in 04/2020 -significantly improved since last visit). Negative for weight gain.  Cardiovascular:  Positive for palpitations (nighlty, worse laying on left side). Negative for chest pain, claudication, leg swelling, near-syncope, orthopnea, paroxysmal nocturnal dyspnea and syncope.  Respiratory:  Negative for shortness of breath.   Neurological:  Negative for dizziness.   Objective  Blood pressure 116/82, pulse 69, temperature 98 F (36.7 C),  resp. rate 16, height 6' (1.829 m), weight 212 lb (96.2 kg), SpO2 95 %.  Vitals with BMI 08/08/2020 07/11/2020 07/10/2020  Height 6' 0"  6' 0"  6' 0"   Weight 212 lbs 213 lbs 214 lbs 10 oz  BMI 28.75 74.12 87.8  Systolic 676 720 947  Diastolic 82 80 79  Pulse 69 108 87      Physical Exam Vitals reviewed.  HENT:     Head: Normocephalic and atraumatic.  Cardiovascular:     Rate and Rhythm: Normal rate and regular rhythm. No extrasystoles are present.    Pulses: Intact distal pulses.     Heart sounds: S1 normal and S2 normal. No murmur heard.   No gallop.  Pulmonary:     Effort: Pulmonary effort is normal. No respiratory distress.     Breath sounds: No wheezing, rhonchi or rales.  Musculoskeletal:     Right lower leg: No edema.     Left lower leg: No edema.  Neurological:     Mental Status: He is alert.    Laboratory examination:   Recent Labs    08/30/19 0900 04/23/20 0750 06/12/20 0905 06/19/20 0808 06/25/20 0948  NA 139   < > 137 137 138  K 4.6   < > 4.8 4.4 4.5  CL 105   < > 100 106 104  CO2 26   < > 27 21* 26  GLUCOSE 106*   < > 113* 108* 89  BUN 9   < > 20 18 13   CREATININE 0.95   < > 0.73 0.84 0.74  CALCIUM 9.9   < > 9.2 9.2 9.6  GFRNONAA >60   < > >60 >60 >60  GFRAA >60  --   --   --   --    < > = values in this interval not displayed.   CrCl cannot be calculated (Patient's most recent lab result is older than the maximum 21 days allowed.).  CMP Latest Ref Rng & Units 06/25/2020 06/19/2020 06/12/2020  Glucose 70 - 99 mg/dL 89 108(H) 113(H)  BUN 6 - 20 mg/dL 13 18 20   Creatinine 0.61 - 1.24 mg/dL 0.74 0.84 0.73  Sodium 135 - 145 mmol/L 138 137 137  Potassium 3.5 - 5.1 mmol/L 4.5 4.4 4.8  Chloride 98 - 111 mmol/L 104 106 100  CO2 22 - 32 mmol/L 26 21(L) 27  Calcium 8.9 - 10.3 mg/dL 9.6 9.2 9.2  Total Protein 6.5 - 8.1 g/dL 7.0 7.1 6.4(L)  Total Bilirubin 0.3 - 1.2 mg/dL 0.3 0.3 0.3  Alkaline Phos 38 - 126 U/L 52 53 47  AST 15 - 41 U/L 13(L) 14(L) 10(L)  ALT 0  - 44 U/L 12 12 13    CBC Latest Ref Rng & Units 06/25/2020 06/19/2020 06/12/2020  WBC 4.0 - 10.5 K/uL 1.6(L) 1.9(L) 2.2(L)  Hemoglobin 13.0 - 17.0 g/dL 11.6(L) 10.8(L) 10.7(L)  Hematocrit  39.0 - 52.0 % 33.8(L) 31.7(L) 30.3(L)  Platelets 150 - 400 K/uL 208 80(L) 268    Lipid Panel No results for input(s): CHOL, TRIG, LDLCALC, VLDL, HDL, CHOLHDL, LDLDIRECT in the last 8760 hours.  HEMOGLOBIN A1C No results found for: HGBA1C, MPG TSH No results for input(s): TSH in the last 8760 hours.  External labs:   07/05/2020: Glucose 107, BUN 15, creatinine 0.8, GFR 102, sodium 138, potassium 4.9, AST 14, ALT 9, alk phos 54 Hemoglobin 13.2, hematocrit 38.3, MCV 93, platelet 223 TSH 2.1 Magnesium 2.2 Total cholesterol 135, HDL 49, triglycerides 50, LDL 74  Allergies   Allergies  Allergen Reactions   Penicillins     Rash age 47    Medications Prior to Visit:   Outpatient Medications Prior to Visit  Medication Sig Dispense Refill   acetaminophen (TYLENOL) 500 MG tablet Take 1,000 mg by mouth every 6 (six) hours as needed for moderate pain.     Bacillus Coagulans-Inulin (ALIGN PREBIOTIC-PROBIOTIC PO) Take 1 tablet by mouth daily.     cetirizine (ZYRTEC) 10 MG tablet Take 10 mg by mouth daily.     Cholecalciferol (VITAMIN D3) 5000 UNITS CAPS Take 5,000 Units by mouth daily. Takes Monday thru friday     cyclobenzaprine (FLEXERIL) 10 MG tablet Take 10 mg by mouth 3 (three) times daily as needed for muscle spasms. As needed     fluticasone (FLONASE) 50 MCG/ACT nasal spray Place 2 sprays into both nostrils daily.     rosuvastatin (CRESTOR) 10 MG tablet Take 10 mg by mouth daily.     testosterone cypionate (DEPOTESTOSTERONE CYPIONATE) 200 MG/ML injection Inject 80 mg into the muscle See admin instructions. Every 9 days     metoprolol succinate (TOPROL-XL) 25 MG 24 hr tablet Take 0.5 tablets (12.5 mg total) by mouth daily. Take with or immediately following a meal. (Patient taking differently: Take 25  mg by mouth daily. Take with or immediately following a meal.) 15 tablet 3   esomeprazole (NEXIUM) 20 MG capsule Take 20 mg by mouth at bedtime.     No facility-administered medications prior to visit.   Final Medications at End of Visit    Current Meds  Medication Sig   acetaminophen (TYLENOL) 500 MG tablet Take 1,000 mg by mouth every 6 (six) hours as needed for moderate pain.   Bacillus Coagulans-Inulin (ALIGN PREBIOTIC-PROBIOTIC PO) Take 1 tablet by mouth daily.   cetirizine (ZYRTEC) 10 MG tablet Take 10 mg by mouth daily.   Cholecalciferol (VITAMIN D3) 5000 UNITS CAPS Take 5,000 Units by mouth daily. Takes Monday thru friday   cyclobenzaprine (FLEXERIL) 10 MG tablet Take 10 mg by mouth 3 (three) times daily as needed for muscle spasms. As needed   fluticasone (FLONASE) 50 MCG/ACT nasal spray Place 2 sprays into both nostrils daily.   rosuvastatin (CRESTOR) 10 MG tablet Take 10 mg by mouth daily.   testosterone cypionate (DEPOTESTOSTERONE CYPIONATE) 200 MG/ML injection Inject 80 mg into the muscle See admin instructions. Every 9 days   [DISCONTINUED] metoprolol succinate (TOPROL-XL) 25 MG 24 hr tablet Take 0.5 tablets (12.5 mg total) by mouth daily. Take with or immediately following a meal. (Patient taking differently: Take 25 mg by mouth daily. Take with or immediately following a meal.)   Radiology:   No results found.  Cardiac Studies:   PCV ECHOCARDIOGRAM COMPLETE 07/30/2020 Left ventricle cavity is normal in size and wall thickness. Normal global wall motion. Normal LV systolic function with EF 68%. Normal diastolic filling  pattern. Mild (Grade I) mitral regurgitation. Trace tricuspid regurgitation. Peak RA-RV gradient 20 mmHg. IVC not seen. Mild pulmonic regurgitation.   PCV MYOCARDIAL PERFUSION WO LEXISCAN 07/30/2020 Normal ECG stress. The patient exercised for 6 minutes and 0 seconds of a Bruce protocol, achieving approximately 7.05 METs. Exercise capacity reduced. Normal  BP response. Myocardial perfusion is normal. Overall LV systolic function is normal without regional wall motion abnormalities. Stress LV EF: 75%. No previous exam available for comparison. Low risk.  EKG:  08/08/2020: Sinus rhythm at a rate of 69 bpm.  Normal axis.  Incomplete right bundle branch block.  No evidence of ischemia or underlying injury pattern.  Compared to EKG 07/11/2020, no PACs  External EKG date unknown: Sinus rhythm with 2 PACs at a rate of 88 bpm.  Normal axis.  Poor R wave progression, cannot exclude anteroseptal infarct old.  No evidence of ischemia or underlying injury pattern.  Assessment     ICD-10-CM   1. Palpitations  R00.2 EKG 12-Lead    2. Decreased exercise tolerance  R68.89        Medications Discontinued During This Encounter  Medication Reason   esomeprazole (NEXIUM) 20 MG capsule Error   metoprolol succinate (TOPROL-XL) 25 MG 24 hr tablet Side effect (s)    No orders of the defined types were placed in this encounter.   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  He has history of hypercholesterolemia, ascending thoracic aortic aneurysm (4.3 cm per chest CT 06/2020 stable since 2010), metastatic seminoma, stage IIb (chemotherapy 04/23/2020 - 06/19/2020).  Patient also has history of colon polyps, seasonal rhinitis, vitamin D deficiency, and scoliosis.  Patient's family history significant for father with MI at age 51. Patient is referred to our office for evaluation of palpitations.   Patient presents for 4-week follow-up of palpitations and results of cardiac testing.  At last visit added metoprolol succinate 12.5 mg daily as well as ordered echocardiogram and stress test.  Patient has had significant improvement in his energy level and fatigue since last visit.  He continues to increase his physical activity as tolerated with a goal of exercising 30 to 45 minutes 6  days/week.   Reviewed and discussed with patient results of echocardiogram and stress test, details above.  EKG today revealed normal sinus rhythm without PACs.  Patient continues to have palpitations merrily at night.  Given low risk stress test and relatively unremarkable echocardiogram discussed with patient symptom management options.  Shared decision was to discontinue metoprolol given concern for side effects.  Patient will notify our office if palpitation symptoms worsen with discontinuation of metoprolol, could consider switching to diltiazem ER 120 mg once daily.  If patient's symptoms worsen could also consider amatory cardiac monitor, however we will hold off at this time and proceed with watchful waiting.  Patient is followed by cardiothoracic surgery for surveillance of aortic aneurysm, and has close follow-up with PCP.  He is otherwise stable from a cardiovascular standpoint.  Notably given mild nuclear disease on echocardiogram would recommend repeat echo in 3-5 years, will defer to PCP.  Follow-up as needed.   Alethia Berthold, PA-C 08/08/2020, 11:30 AM Office: [phone]

## 2020-08-08 ENCOUNTER — Ambulatory Visit: Payer: BC Managed Care – PPO | Admitting: Student

## 2020-08-08 ENCOUNTER — Encounter: Payer: Self-pay | Admitting: Student

## 2020-08-08 ENCOUNTER — Other Ambulatory Visit: Payer: Self-pay

## 2020-08-08 VITALS — BP 116/82 | HR 69 | Temp 98.0°F | Resp 16 | Ht 72.0 in | Wt 212.0 lb

## 2020-08-08 DIAGNOSIS — R002 Palpitations: Secondary | ICD-10-CM

## 2020-08-08 DIAGNOSIS — R6889 Other general symptoms and signs: Secondary | ICD-10-CM | POA: Diagnosis not present

## 2020-08-15 ENCOUNTER — Telehealth: Payer: Self-pay | Admitting: Oncology

## 2020-08-15 NOTE — Telephone Encounter (Signed)
Called patient regarding upcoming September and October appointments, patient is notified.  

## 2020-08-21 ENCOUNTER — Inpatient Hospital Stay: Payer: BC Managed Care – PPO | Attending: Oncology

## 2020-08-21 ENCOUNTER — Other Ambulatory Visit: Payer: Self-pay

## 2020-08-21 DIAGNOSIS — Z95828 Presence of other vascular implants and grafts: Secondary | ICD-10-CM

## 2020-08-21 DIAGNOSIS — C6292 Malignant neoplasm of left testis, unspecified whether descended or undescended: Secondary | ICD-10-CM | POA: Insufficient documentation

## 2020-08-21 LAB — CMP (CANCER CENTER ONLY)
ALT: 18 U/L (ref 0–44)
AST: 18 U/L (ref 15–41)
Albumin: 4.2 g/dL (ref 3.5–5.0)
Alkaline Phosphatase: 47 U/L (ref 38–126)
Anion gap: 9 (ref 5–15)
BUN: 13 mg/dL (ref 6–20)
CO2: 23 mmol/L (ref 22–32)
Calcium: 9.5 mg/dL (ref 8.9–10.3)
Chloride: 106 mmol/L (ref 98–111)
Creatinine: 0.81 mg/dL (ref 0.61–1.24)
GFR, Estimated: >60 mL/min (ref >60)
Glucose, Bld: 90 mg/dL (ref 70–99)
Potassium: 4.3 mmol/L (ref 3.5–5.1)
Sodium: 138 mmol/L (ref 135–145)
Total Bilirubin: 0.4 mg/dL (ref 0.3–1.2)
Total Protein: 7 g/dL (ref 6.5–8.1)

## 2020-08-21 LAB — CBC WITH DIFFERENTIAL (CANCER CENTER ONLY)
Abs Immature Granulocytes: 0.01 10*3/uL (ref 0.00–0.07)
Basophils Absolute: 0 10*3/uL (ref 0.0–0.1)
Basophils Relative: 1 %
Eosinophils Absolute: 0.1 10*3/uL (ref 0.0–0.5)
Eosinophils Relative: 2 %
HCT: 40.5 % (ref 39.0–52.0)
Hemoglobin: 13.8 g/dL (ref 13.0–17.0)
Immature Granulocytes: 0 %
Lymphocytes Relative: 23 %
Lymphs Abs: 0.9 10*3/uL (ref 0.7–4.0)
MCH: 31 pg (ref 26.0–34.0)
MCHC: 34.1 g/dL (ref 30.0–36.0)
MCV: 91 fL (ref 80.0–100.0)
Monocytes Absolute: 0.3 10*3/uL (ref 0.1–1.0)
Monocytes Relative: 9 %
Neutro Abs: 2.5 10*3/uL (ref 1.7–7.7)
Neutrophils Relative %: 65 %
Platelet Count: 140 10*3/uL — ABNORMAL LOW (ref 150–400)
RBC: 4.45 MIL/uL (ref 4.22–5.81)
RDW: 13.5 % (ref 11.5–15.5)
WBC Count: 3.8 10*3/uL — ABNORMAL LOW (ref 4.0–10.5)
nRBC: 0 % (ref 0.0–0.2)

## 2020-08-21 MED ORDER — HEPARIN SOD (PORK) LOCK FLUSH 100 UNIT/ML IV SOLN
500.0000 [IU] | Freq: Once | INTRAVENOUS | Status: AC
Start: 2020-08-21 — End: 2020-08-21
  Administered 2020-08-21: 500 [IU]
  Filled 2020-08-21: qty 5

## 2020-08-21 MED ORDER — SODIUM CHLORIDE 0.9% FLUSH
10.0000 mL | Freq: Once | INTRAVENOUS | Status: AC
  Administered 2020-08-21: 10 mL
  Filled 2020-08-21: qty 10

## 2020-09-24 DIAGNOSIS — Z8547 Personal history of malignant neoplasm of testis: Secondary | ICD-10-CM | POA: Diagnosis not present

## 2020-10-09 ENCOUNTER — Other Ambulatory Visit: Payer: Self-pay

## 2020-10-09 ENCOUNTER — Inpatient Hospital Stay: Payer: BC Managed Care – PPO

## 2020-10-09 ENCOUNTER — Inpatient Hospital Stay: Payer: BC Managed Care – PPO | Attending: Oncology

## 2020-10-09 DIAGNOSIS — Z452 Encounter for adjustment and management of vascular access device: Secondary | ICD-10-CM | POA: Diagnosis not present

## 2020-10-09 DIAGNOSIS — Z95828 Presence of other vascular implants and grafts: Secondary | ICD-10-CM

## 2020-10-09 DIAGNOSIS — C6292 Malignant neoplasm of left testis, unspecified whether descended or undescended: Secondary | ICD-10-CM

## 2020-10-09 LAB — CMP (CANCER CENTER ONLY)
ALT: 19 U/L (ref 0–44)
AST: 16 U/L (ref 15–41)
Albumin: 4.1 g/dL (ref 3.5–5.0)
Alkaline Phosphatase: 49 U/L (ref 38–126)
Anion gap: 8 (ref 5–15)
BUN: 19 mg/dL (ref 6–20)
CO2: 25 mmol/L (ref 22–32)
Calcium: 9.6 mg/dL (ref 8.9–10.3)
Chloride: 105 mmol/L (ref 98–111)
Creatinine: 1.03 mg/dL (ref 0.61–1.24)
GFR, Estimated: >60 mL/min (ref >60)
Glucose, Bld: 103 mg/dL — ABNORMAL HIGH (ref 70–99)
Potassium: 4.6 mmol/L (ref 3.5–5.1)
Sodium: 138 mmol/L (ref 135–145)
Total Bilirubin: 0.3 mg/dL (ref 0.3–1.2)
Total Protein: 6.9 g/dL (ref 6.5–8.1)

## 2020-10-09 LAB — CBC WITH DIFFERENTIAL (CANCER CENTER ONLY)
Abs Immature Granulocytes: 0 10*3/uL (ref 0.00–0.07)
Basophils Absolute: 0 10*3/uL (ref 0.0–0.1)
Basophils Relative: 1 %
Eosinophils Absolute: 0 10*3/uL (ref 0.0–0.5)
Eosinophils Relative: 1 %
HCT: 45.2 % (ref 39.0–52.0)
Hemoglobin: 14.9 g/dL (ref 13.0–17.0)
Immature Granulocytes: 0 %
Lymphocytes Relative: 28 %
Lymphs Abs: 0.8 10*3/uL (ref 0.7–4.0)
MCH: 29.1 pg (ref 26.0–34.0)
MCHC: 33 g/dL (ref 30.0–36.0)
MCV: 88.3 fL (ref 80.0–100.0)
Monocytes Absolute: 0.3 10*3/uL (ref 0.1–1.0)
Monocytes Relative: 11 %
Neutro Abs: 1.7 10*3/uL (ref 1.7–7.7)
Neutrophils Relative %: 59 %
Platelet Count: 153 10*3/uL (ref 150–400)
RBC: 5.12 MIL/uL (ref 4.22–5.81)
RDW: 13.2 % (ref 11.5–15.5)
WBC Count: 2.8 10*3/uL — ABNORMAL LOW (ref 4.0–10.5)
nRBC: 0 % (ref 0.0–0.2)

## 2020-10-09 MED ORDER — SODIUM CHLORIDE 0.9% FLUSH
10.0000 mL | Freq: Once | INTRAVENOUS | Status: AC
Start: 2020-10-09 — End: 2020-10-09
  Administered 2020-10-09: 10 mL

## 2020-10-09 MED ORDER — HEPARIN SOD (PORK) LOCK FLUSH 100 UNIT/ML IV SOLN
500.0000 [IU] | Freq: Once | INTRAVENOUS | Status: AC
  Administered 2020-10-09: 500 [IU]

## 2020-10-12 ENCOUNTER — Other Ambulatory Visit: Payer: Self-pay

## 2020-10-12 ENCOUNTER — Ambulatory Visit (INDEPENDENT_AMBULATORY_CARE_PROVIDER_SITE_OTHER): Payer: BC Managed Care – PPO

## 2020-10-12 DIAGNOSIS — C6290 Malignant neoplasm of unspecified testis, unspecified whether descended or undescended: Secondary | ICD-10-CM | POA: Diagnosis not present

## 2020-10-12 DIAGNOSIS — C6292 Malignant neoplasm of left testis, unspecified whether descended or undescended: Secondary | ICD-10-CM | POA: Diagnosis not present

## 2020-10-12 DIAGNOSIS — Z09 Encounter for follow-up examination after completed treatment for conditions other than malignant neoplasm: Secondary | ICD-10-CM

## 2020-10-12 DIAGNOSIS — K7689 Other specified diseases of liver: Secondary | ICD-10-CM | POA: Diagnosis not present

## 2020-10-12 DIAGNOSIS — I739 Peripheral vascular disease, unspecified: Secondary | ICD-10-CM | POA: Diagnosis not present

## 2020-10-12 DIAGNOSIS — I712 Thoracic aortic aneurysm, without rupture: Secondary | ICD-10-CM | POA: Diagnosis not present

## 2020-10-12 DIAGNOSIS — I700 Atherosclerosis of aorta: Secondary | ICD-10-CM | POA: Diagnosis not present

## 2020-10-12 MED ORDER — IOHEXOL 300 MG/ML  SOLN
100.0000 mL | Freq: Once | INTRAMUSCULAR | Status: AC | PRN
  Administered 2020-10-12: 100 mL via INTRAVENOUS

## 2020-10-16 ENCOUNTER — Inpatient Hospital Stay: Payer: BC Managed Care – PPO | Attending: Oncology | Admitting: Oncology

## 2020-10-16 ENCOUNTER — Other Ambulatory Visit: Payer: Self-pay

## 2020-10-16 VITALS — BP 129/78 | HR 65 | Temp 98.6°F | Resp 18 | Wt 203.1 lb

## 2020-10-16 DIAGNOSIS — Z9079 Acquired absence of other genital organ(s): Secondary | ICD-10-CM | POA: Insufficient documentation

## 2020-10-16 DIAGNOSIS — Z923 Personal history of irradiation: Secondary | ICD-10-CM | POA: Insufficient documentation

## 2020-10-16 DIAGNOSIS — C6292 Malignant neoplasm of left testis, unspecified whether descended or undescended: Secondary | ICD-10-CM | POA: Diagnosis not present

## 2020-10-16 DIAGNOSIS — Z9221 Personal history of antineoplastic chemotherapy: Secondary | ICD-10-CM | POA: Diagnosis not present

## 2020-10-16 DIAGNOSIS — Z8547 Personal history of malignant neoplasm of testis: Secondary | ICD-10-CM | POA: Diagnosis not present

## 2020-10-16 NOTE — Progress Notes (Signed)
Hematology and Oncology   Kevin Hess [id number] [birth year] 50 y.o. 10/16/2020 9:30 AM Kevin Nutting, DO    Provider's location: Office    Principle Diagnosis: 50 year old man with left testicular cancer diagnosed in May 2021.  He developed stage IIb seminoma with lymphadenopathy in April 2022.   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        Prior Therapy:  Status post right radical orchiectomy with the pathology showed a 2.2 cm seminoma in August 2009.   He is status post adjuvant radiation therapy to the pelvis with a total of 26 Gy in 16 fractions concluded in October 2009. He is status post radical orchiectomy on the left completed on Jun 10, 2019.  The final pathology showed a 2.5 cm pure seminoma that is organ confined. BEP chemotherapy started on April 23, 2020.  He completed 3 cycles of therapy on June 19, 2020.     Current therapy: Active surveillance.   Interim History: Kevin Hess returns today for a follow-up visit.  Since last visit, he reports no major changes at this time.  He continues to recover from the effects of chemotherapy without any new complaints.  He denies any nausea, vomiting or abdominal pain.  He denies any worsening neuropathy or shortness of breath.  He denies any cough or wheezing.  His performance status quality of life remained at baseline.   Medications: Reviewed without changes. Current Outpatient Medications  Medication Sig Dispense Refill   acetaminophen (TYLENOL) 500 MG tablet Take 1,000 mg by mouth every 6 (six) hours as needed for moderate pain.     Bacillus Coagulans-Inulin (ALIGN PREBIOTIC-PROBIOTIC PO) Take 1 tablet by mouth daily.     cetirizine (ZYRTEC) 10 MG tablet Take 10 mg by mouth daily.     Cholecalciferol (VITAMIN D3) 5000 UNITS CAPS Take 5,000 Units by mouth daily. Takes Monday thru friday     cyclobenzaprine (FLEXERIL) 10 MG tablet Take 10 mg by mouth 3 (three) times  daily as needed for muscle spasms. As needed     fluticasone (FLONASE) 50 MCG/ACT nasal spray Place 2 sprays into both nostrils daily.     rosuvastatin (CRESTOR) 10 MG tablet Take 10 mg by mouth daily.     testosterone cypionate (DEPOTESTOSTERONE CYPIONATE) 200 MG/ML injection Inject 80 mg into the muscle See admin instructions. Every 9 days     No current facility-administered medications for this visit.     Allergies:  Allergies  Allergen Reactions   Penicillins     Rash age 45    Physical exam    Blood pressure 129/78, pulse 65, temperature 98.6 F (37 C), temperature source Oral, resp. rate 18, weight 203 lb 2 oz (92.1 kg), SpO2 100 %.    ECOG 0   General appearance: Comfortable appearing without any discomfort Head: Normocephalic without any trauma Oropharynx: Mucous membranes are moist and pink without any thrush or ulcers. Eyes: Pupils are equal and round reactive to light. Lymph nodes: No cervical, supraclavicular, inguinal or axillary lymphadenopathy.   Heart:regular rate and rhythm.  S1 and S2 without leg edema. Lung: Clear without any rhonchi or wheezes.  No dullness to percussion. Abdomin: Soft, nontender, nondistended with good bowel sounds.  No hepatosplenomegaly. Musculoskeletal: No joint deformity or effusion.  Full range of motion noted. Neurological: No deficits noted on motor, sensory and deep tendon reflex exam. Skin: No petechial rash or dryness.  Appeared moist.       Lab  Results: Lab Results  Component Value Date   WBC 2.8 (L) 10/09/2020   HGB 14.9 10/09/2020   HCT 45.2 10/09/2020   MCV 88.3 10/09/2020   PLT 153 10/09/2020     Chemistry      Component Value Date/Time   NA 138 10/09/2020 1103   NA 140 10/09/2016 1428   K 4.6 10/09/2020 1103   K 4.3 10/09/2016 1428   CL 105 10/09/2020 1103   CL 104 03/02/2012 1043   CO2 25 10/09/2020 1103   CO2 27 10/09/2016 1428   BUN 19 10/09/2020 1103   BUN 10.3 10/09/2016 1428   CREATININE  1.03 10/09/2020 1103   CREATININE 0.8 10/09/2016 1428      Component Value Date/Time   CALCIUM 9.6 10/09/2020 1103   CALCIUM 10.2 10/09/2016 1428   ALKPHOS 49 10/09/2020 1103   ALKPHOS 55 10/09/2016 1428   AST 16 10/09/2020 1103   AST 19 10/09/2016 1428   ALT 19 10/09/2020 1103   ALT 17 10/09/2016 1428   BILITOT 0.3 10/09/2020 1103   BILITOT 0.42 10/09/2016 1428     EXAM: CT CHEST, ABDOMEN, AND PELVIS WITH CONTRAST   TECHNIQUE: Multidetector CT imaging of the chest, abdomen and pelvis was performed following the standard protocol during bolus administration of intravenous contrast.   CONTRAST:  184mL OMNIPAQUE IOHEXOL 300 MG/ML  SOLN   COMPARISON:  CT C AP 06/26/2020.   FINDINGS: CT CHEST FINDINGS   Cardiovascular: Right anterior chest wall Port-A-Cath is present with tip terminating in the superior vena cava. Normal heart size. No pericardial effusion. Stable 4.2 cm ascending thoracic aortic aneurysm.   Mediastinum/Nodes: No enlarged axillary, mediastinal or hilar lymphadenopathy. Normal appearance of the esophagus.   Lungs/Pleura: Central airways are patent. No large area pulmonary consolidation. No discrete pulmonary nodule. No pleural effusion or pneumothorax.   Musculoskeletal: No aggressive or acute appearing osseous lesions. Thoracolumbar spinal fixation rods.   CT ABDOMEN PELVIS FINDINGS   Hepatobiliary: Liver is normal in size and contour. Stable subcentimeter low-attenuation lesions within the hepatic dome and right hepatic lobe. No new or enlarging hepatic lesions identified. Gallbladder is unremarkable. No intrahepatic or extrahepatic biliary ductal dilatation.   Pancreas: Unremarkable   Spleen: Unremarkable   Adrenals/Urinary Tract: Unchanged 7 mm thickening of the left adrenal gland. Normal right adrenal gland. Kidneys enhance symmetrically with contrast. Too small to characterize subcentimeter low-attenuation lesion partially exophytic  inferior pole right kidney. Urinary bladder is unremarkable.   Stomach/Bowel: Oral contrast material to the level of the rectum. Normal appendix. No abnormal bowel wall thickening or evidence for bowel obstruction.   Vascular/Lymphatic: Normal caliber abdominal aorta. Peripheral calcified atherosclerotic. Unchanged 4 mm left periaortic lymph node (image 72; series 2). Unchanged 4 mm left periaortic lymph node (image 80; series 2).   Reproductive: Heterogeneous prostate.   Other: None.   Musculoskeletal: No aggressive or acute appearing osseous lesions. Thoracolumbar fixation hardware.   IMPRESSION: 1. Grossly unchanged retroperitoneal lymph nodes. 2. No evidence for new or progressive disease in the chest, abdomen or pelvis. 3. Unchanged 4.2 cm ascending thoracic aortic aneurysm. Recommend annual imaging followup by CTA or MRA. This recommendation follows 2010 ACCF/AHA/AATS/ACR/ASA/SCA/SCAI/SIR/STS/SVM Guidelines for the Diagnosis and Management of Patients with Thoracic Aortic Disease. Circulation. 2010; 121: K938-H829. Aortic aneurysm NOS (ICD10-I71.9)   Impression and Plan:   50 year old man with:   1.    Left testicular cancer documented in 2021.  He presented with T2N0 and subsequently developed stage IIb disease in 2022 with  pelvic adenopathy.     He is currently on active surveillance without any evidence of relapsed disease.  Laboratory data on 10/09/2020 and CT scan on 10/12/2020 were reviewed and showed no evidence of relapsed disease.  At this time, I see no need for any additional therapy and will continue active surveillance.  We will repeat imaging studies every 3 months to complete a year to every 6 months after the.    2.  Right testicular seminoma: No evidence of relapse and status post orchiectomy.   3.    Pulmonary toxicity screening: No delayed toxicities noted at this time.   4.    IV access: Risks and benefits of Port-A-Cath removal were discussed.  He  elected to have it removed which we will arrange for in the near future.   5.  Follow-up: He will return in 3 months for repeat follow-up and imaging studies.     30  minutes were spent on this visit.  Time was dedicated to reviewing laboratory data, imaging studies and future treatment options.    Zola Button, MD 10/16/2020 9:30 AM

## 2020-11-05 ENCOUNTER — Ambulatory Visit (HOSPITAL_COMMUNITY)
Admission: RE | Admit: 2020-11-05 | Discharge: 2020-11-05 | Disposition: A | Discharge status: Home / Self Care | Payer: BC Managed Care – PPO | Source: Ambulatory Visit | Attending: Oncology | Admitting: Oncology

## 2020-11-05 ENCOUNTER — Other Ambulatory Visit: Payer: Self-pay

## 2020-11-05 DIAGNOSIS — Z452 Encounter for adjustment and management of vascular access device: Secondary | ICD-10-CM | POA: Diagnosis not present

## 2020-11-05 DIAGNOSIS — C6292 Malignant neoplasm of left testis, unspecified whether descended or undescended: Secondary | ICD-10-CM | POA: Diagnosis not present

## 2020-11-05 DIAGNOSIS — C6200 Malignant neoplasm of unspecified undescended testis: Secondary | ICD-10-CM | POA: Diagnosis not present

## 2020-11-05 HISTORY — PX: IR REMOVAL TUN ACCESS W/ PORT W/O FL MOD SED: IMG2290

## 2020-11-05 MED ORDER — LIDOCAINE-EPINEPHRINE 1 %-1:100000 IJ SOLN
10.0000 mL | Freq: Once | INTRAMUSCULAR | Status: AC
  Administered 2020-11-05: 10 mL

## 2020-11-05 MED ORDER — LIDOCAINE-EPINEPHRINE 1 %-1:100000 IJ SOLN
INTRAMUSCULAR | Status: AC
  Filled 2020-11-05: qty 1

## 2020-11-05 NOTE — Procedures (Signed)
Interventional Radiology Procedure Note  Procedure: RT CHEST PORT REMOVAL    Complications: None  Estimated Blood Loss:  MIN  Findings: FULL REPORT IN PACS     Tamera Punt, MD

## 2020-11-22 ENCOUNTER — Encounter: Payer: Self-pay | Admitting: Oncology

## 2020-12-19 DIAGNOSIS — D1801 Hemangioma of skin and subcutaneous tissue: Secondary | ICD-10-CM | POA: Diagnosis not present

## 2020-12-19 DIAGNOSIS — D225 Melanocytic nevi of trunk: Secondary | ICD-10-CM | POA: Diagnosis not present

## 2020-12-25 ENCOUNTER — Other Ambulatory Visit: Payer: Self-pay

## 2020-12-25 ENCOUNTER — Inpatient Hospital Stay: Payer: BC Managed Care – PPO | Attending: Oncology

## 2020-12-25 DIAGNOSIS — Z8547 Personal history of malignant neoplasm of testis: Secondary | ICD-10-CM | POA: Insufficient documentation

## 2020-12-25 DIAGNOSIS — C6292 Malignant neoplasm of left testis, unspecified whether descended or undescended: Secondary | ICD-10-CM

## 2020-12-25 LAB — CBC WITH DIFFERENTIAL (CANCER CENTER ONLY)
Abs Immature Granulocytes: 0.01 10*3/uL (ref 0.00–0.07)
Basophils Absolute: 0 10*3/uL (ref 0.0–0.1)
Basophils Relative: 1 %
Eosinophils Absolute: 0 10*3/uL (ref 0.0–0.5)
Eosinophils Relative: 1 %
HCT: 45.6 % (ref 39.0–52.0)
Hemoglobin: 15.2 g/dL (ref 13.0–17.0)
Immature Granulocytes: 0 %
Lymphocytes Relative: 27 %
Lymphs Abs: 0.8 10*3/uL (ref 0.7–4.0)
MCH: 28.7 pg (ref 26.0–34.0)
MCHC: 33.3 g/dL (ref 30.0–36.0)
MCV: 86.2 fL (ref 80.0–100.0)
Monocytes Absolute: 0.3 10*3/uL (ref 0.1–1.0)
Monocytes Relative: 9 %
Neutro Abs: 1.8 10*3/uL (ref 1.7–7.7)
Neutrophils Relative %: 62 %
Platelet Count: 174 10*3/uL (ref 150–400)
RBC: 5.29 MIL/uL (ref 4.22–5.81)
RDW: 15.3 % (ref 11.5–15.5)
WBC Count: 3 10*3/uL — ABNORMAL LOW (ref 4.0–10.5)
nRBC: 0 % (ref 0.0–0.2)

## 2020-12-25 LAB — CMP (CANCER CENTER ONLY)
ALT: 20 U/L (ref 0–44)
AST: 17 U/L (ref 15–41)
Albumin: 4.2 g/dL (ref 3.5–5.0)
Alkaline Phosphatase: 52 U/L (ref 38–126)
Anion gap: 7 (ref 5–15)
BUN: 20 mg/dL (ref 6–20)
CO2: 24 mmol/L (ref 22–32)
Calcium: 9.2 mg/dL (ref 8.9–10.3)
Chloride: 106 mmol/L (ref 98–111)
Creatinine: 1.19 mg/dL (ref 0.61–1.24)
GFR, Estimated: >60 mL/min (ref >60)
Glucose, Bld: 102 mg/dL — ABNORMAL HIGH (ref 70–99)
Potassium: 4.8 mmol/L (ref 3.5–5.1)
Sodium: 137 mmol/L (ref 135–145)
Total Bilirubin: 0.5 mg/dL (ref 0.3–1.2)
Total Protein: 7.3 g/dL (ref 6.5–8.1)

## 2020-12-25 LAB — LACTATE DEHYDROGENASE: LDH: 134 U/L (ref 98–192)

## 2020-12-26 LAB — BETA HCG QUANT (REF LAB): hCG Quant: <1 m[IU]/mL (ref 0–3)

## 2020-12-26 LAB — AFP TUMOR MARKER: AFP, Serum, Tumor Marker: 2.3 ng/mL (ref 0.0–8.4)

## 2020-12-28 ENCOUNTER — Ambulatory Visit (INDEPENDENT_AMBULATORY_CARE_PROVIDER_SITE_OTHER): Payer: BC Managed Care – PPO

## 2020-12-28 ENCOUNTER — Other Ambulatory Visit: Payer: Self-pay

## 2020-12-28 DIAGNOSIS — C6292 Malignant neoplasm of left testis, unspecified whether descended or undescended: Secondary | ICD-10-CM | POA: Diagnosis not present

## 2020-12-28 DIAGNOSIS — Z8547 Personal history of malignant neoplasm of testis: Secondary | ICD-10-CM | POA: Diagnosis not present

## 2020-12-28 DIAGNOSIS — K7689 Other specified diseases of liver: Secondary | ICD-10-CM | POA: Diagnosis not present

## 2020-12-28 DIAGNOSIS — N2889 Other specified disorders of kidney and ureter: Secondary | ICD-10-CM | POA: Diagnosis not present

## 2020-12-28 MED ORDER — IOHEXOL 300 MG/ML  SOLN
100.0000 mL | Freq: Once | INTRAMUSCULAR | Status: AC | PRN
  Administered 2020-12-28: 100 mL via INTRAVENOUS

## 2021-01-02 NOTE — Progress Notes (Signed)
Left pt a message per Dr Alen Blew: Can you please let him know his CT looks good. No cancer  Told pt to call back if he has any questions.

## 2021-01-04 DIAGNOSIS — E291 Testicular hypofunction: Secondary | ICD-10-CM | POA: Diagnosis not present

## 2021-01-11 DIAGNOSIS — Z1339 Encounter for screening examination for other mental health and behavioral disorders: Secondary | ICD-10-CM | POA: Diagnosis not present

## 2021-01-11 DIAGNOSIS — I700 Atherosclerosis of aorta: Secondary | ICD-10-CM | POA: Diagnosis not present

## 2021-01-11 DIAGNOSIS — Z1331 Encounter for screening for depression: Secondary | ICD-10-CM | POA: Diagnosis not present

## 2021-01-11 DIAGNOSIS — Z125 Encounter for screening for malignant neoplasm of prostate: Secondary | ICD-10-CM | POA: Diagnosis not present

## 2021-01-11 DIAGNOSIS — E785 Hyperlipidemia, unspecified: Secondary | ICD-10-CM | POA: Diagnosis not present

## 2021-01-11 DIAGNOSIS — E291 Testicular hypofunction: Secondary | ICD-10-CM | POA: Diagnosis not present

## 2021-01-11 DIAGNOSIS — Z0000 Encounter for general adult medical examination without abnormal findings: Secondary | ICD-10-CM | POA: Diagnosis not present

## 2021-01-14 ENCOUNTER — Encounter: Payer: Self-pay | Admitting: Oncology

## 2021-01-15 ENCOUNTER — Other Ambulatory Visit: Payer: Self-pay | Admitting: Oncology

## 2021-01-15 ENCOUNTER — Telehealth: Payer: Self-pay | Admitting: Oncology

## 2021-01-15 DIAGNOSIS — C6292 Malignant neoplasm of left testis, unspecified whether descended or undescended: Secondary | ICD-10-CM

## 2021-01-15 NOTE — Telephone Encounter (Signed)
Scheduled per 01/03 scheduled message, patient has been called and voicemail was left.

## 2021-01-16 ENCOUNTER — Inpatient Hospital Stay: Payer: BC Managed Care – PPO

## 2021-01-23 ENCOUNTER — Inpatient Hospital Stay: Payer: BC Managed Care – PPO | Admitting: Oncology

## 2021-02-15 DIAGNOSIS — E291 Testicular hypofunction: Secondary | ICD-10-CM | POA: Diagnosis not present

## 2021-03-14 ENCOUNTER — Other Ambulatory Visit: Payer: Self-pay | Admitting: *Deleted

## 2021-03-14 ENCOUNTER — Encounter: Payer: Self-pay | Admitting: Oncology

## 2021-03-14 DIAGNOSIS — C6212 Malignant neoplasm of descended left testis: Secondary | ICD-10-CM

## 2021-03-22 DIAGNOSIS — C6212 Malignant neoplasm of descended left testis: Secondary | ICD-10-CM | POA: Diagnosis not present

## 2021-03-26 ENCOUNTER — Other Ambulatory Visit: Payer: BC Managed Care – PPO

## 2021-03-27 ENCOUNTER — Other Ambulatory Visit: Payer: Self-pay

## 2021-03-27 ENCOUNTER — Ambulatory Visit (INDEPENDENT_AMBULATORY_CARE_PROVIDER_SITE_OTHER): Payer: BC Managed Care – PPO

## 2021-03-27 DIAGNOSIS — I7121 Aneurysm of the ascending aorta, without rupture: Secondary | ICD-10-CM | POA: Diagnosis not present

## 2021-03-27 DIAGNOSIS — C6290 Malignant neoplasm of unspecified testis, unspecified whether descended or undescended: Secondary | ICD-10-CM | POA: Diagnosis not present

## 2021-03-27 DIAGNOSIS — I700 Atherosclerosis of aorta: Secondary | ICD-10-CM | POA: Diagnosis not present

## 2021-03-27 DIAGNOSIS — C6292 Malignant neoplasm of left testis, unspecified whether descended or undescended: Secondary | ICD-10-CM | POA: Diagnosis not present

## 2021-03-27 MED ORDER — IOHEXOL 300 MG/ML  SOLN
100.0000 mL | Freq: Once | INTRAMUSCULAR | Status: AC | PRN
  Administered 2021-03-27: 100 mL via INTRAVENOUS

## 2021-04-02 ENCOUNTER — Other Ambulatory Visit: Payer: Self-pay

## 2021-04-02 ENCOUNTER — Inpatient Hospital Stay: Payer: BC Managed Care – PPO | Attending: Oncology | Admitting: Oncology

## 2021-04-02 VITALS — BP 125/79 | HR 74 | Temp 97.7°F | Resp 16 | Wt 195.5 lb

## 2021-04-02 DIAGNOSIS — Z9079 Acquired absence of other genital organ(s): Secondary | ICD-10-CM | POA: Insufficient documentation

## 2021-04-02 DIAGNOSIS — Z923 Personal history of irradiation: Secondary | ICD-10-CM | POA: Insufficient documentation

## 2021-04-02 DIAGNOSIS — Z79899 Other long term (current) drug therapy: Secondary | ICD-10-CM | POA: Diagnosis not present

## 2021-04-02 DIAGNOSIS — Z8547 Personal history of malignant neoplasm of testis: Secondary | ICD-10-CM | POA: Insufficient documentation

## 2021-04-02 DIAGNOSIS — C6212 Malignant neoplasm of descended left testis: Secondary | ICD-10-CM

## 2021-04-02 DIAGNOSIS — Z9221 Personal history of antineoplastic chemotherapy: Secondary | ICD-10-CM | POA: Diagnosis not present

## 2021-04-02 NOTE — Progress Notes (Signed)
Hematology and Oncology  ? ?Kevin Hess ?[id number] ?02/08/1970 51 y.o. ?04/02/2021 10:11 AM ?Kevin Nutting, DO  ? ? ?Provider's location: Office ? ? ? 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   ?  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   ? ?  ?  ?Prior Therapy:  ?Status post right radical orchiectomy with the pathology showed a 2.2 cm seminoma in August 2009.   ?He is status post adjuvant radiation therapy to the pelvis with a total of 26 Gy in 16 fractions concluded in October 2009. ?He is status post radical orchiectomy on the left completed on Jun 10, 2019.  The final pathology showed a 2.5 cm pure seminoma that is organ confined. ?BEP chemotherapy started on April 23, 2020.  He completed 3 cycles of therapy on June 19, 2020. ? ? ? ? ?Current therapy: Active surveillance. ?  ?Interim History: Kevin Hess is here for repeat evaluation.  Since the last visit, he reports no major complaints.  He denies any residual complications related to chemotherapy.  Denies any nausea, vomiting or dyspnea on exertion.  His performance status quality of life remains unchanged.  He is exercising regularly and has resumed his regular exercise routines.  He has very faint sensory neuropathy at the tips of his fingers and toes. ? ? ?Medications: Updated on review. ?Current Outpatient Medications  ?Medication Sig Dispense Refill  ? acetaminophen (TYLENOL) 500 MG tablet Take 1,000 mg by mouth every 6 (six) hours as needed for moderate pain.    ? Bacillus Coagulans-Inulin (ALIGN PREBIOTIC-PROBIOTIC PO) Take 1 tablet by mouth daily.    ? cetirizine (ZYRTEC) 10 MG tablet Take 10 mg by mouth daily.    ? Cholecalciferol (VITAMIN D3) 5000 UNITS CAPS Take 5,000 Units by mouth daily. Takes Monday thru friday    ? cyclobenzaprine (FLEXERIL) 10 MG tablet Take 10 mg by mouth 3 (three) times daily as needed for  muscle spasms. As needed    ? fluticasone (FLONASE) 50 MCG/ACT nasal spray Place 2 sprays into both nostrils daily.    ? rosuvastatin (CRESTOR) 10 MG tablet Take 10 mg by mouth daily.    ? testosterone cypionate (DEPOTESTOSTERONE CYPIONATE) 200 MG/ML injection Inject 80 mg into the muscle See admin instructions. Every 9 days    ? ?No current facility-administered medications for this visit.  ? ? ? ?Allergies:  ?Allergies  ?Allergen Reactions  ? Penicillins   ?  Rash age 51  ? ? ?Physical exam ? ? ? ?Blood pressure 125/79, pulse 74, temperature 97.7 ?F (36.5 ?C), temperature source Tympanic, resp. rate 16, weight 195 lb 8 oz (88.7 kg), SpO2 98 %. ? ? ? ? ?ECOG 0 ? ? ? ?General appearance: Alert, awake without any distress. ?Head: Atraumatic without abnormalities ?Oropharynx: Without any thrush or ulcers. ?Eyes: No scleral icterus. ?Lymph nodes: No lymphadenopathy noted in the cervical, supraclavicular, or axillary nodes ?Heart:regular rate and rhythm, without any murmurs or gallops.   ?Lung: Clear to auscultation without any rhonchi, wheezes or dullness to percussion. ?Abdomin: Soft, nontender without any shifting dullness or ascites. ?Musculoskeletal: No clubbing or cyanosis. ?Neurological: No motor or sensory deficits. ?Skin: No rashes or lesions. ? ? ? ? ? ?Lab Results: ?Lab Results  ?Component Value Date  ? WBC 3.0 (L) 12/25/2020  ? HGB 15.2 12/25/2020  ? HCT 45.6 12/25/2020  ? MCV 86.2 12/25/2020  ? PLT 174 12/25/2020  ? ?  Chemistry   ?   ?Component Value Date/Time  ? NA 137 12/25/2020 1028  ? NA 140 10/09/2016 1428  ? K 4.8 12/25/2020 1028  ? K 4.3 10/09/2016 1428  ? CL 106 12/25/2020 1028  ? CL 104 03/02/2012 1043  ? CO2 24 12/25/2020 1028  ? CO2 27 10/09/2016 1428  ? BUN 20 12/25/2020 1028  ? BUN 10.3 10/09/2016 1428  ? CREATININE 1.19 12/25/2020 1028  ? CREATININE 0.8 10/09/2016 1428  ?    ?Component Value Date/Time  ? CALCIUM 9.2 12/25/2020 1028  ? CALCIUM 10.2 10/09/2016 1428  ? ALKPHOS 52 12/25/2020 1028   ? ALKPHOS 55 10/09/2016 1428  ? AST 17 12/25/2020 1028  ? AST 19 10/09/2016 1428  ? ALT 20 12/25/2020 1028  ? ALT 17 10/09/2016 1428  ? BILITOT 0.5 12/25/2020 1028  ? BILITOT 0.42 10/09/2016 1428  ?  ? ? ?Study Result ? ?Narrative & Impression  ?CLINICAL DATA:  Testicular cancer.  Restaging. ?  ?EXAM: ?CT CHEST, ABDOMEN, AND PELVIS WITH CONTRAST ?  ?TECHNIQUE: ?Multidetector CT imaging of the chest, abdomen and pelvis was ?performed following the standard protocol during bolus ?administration of intravenous contrast. ?  ?RADIATION DOSE REDUCTION: This exam was performed according to the ?departmental dose-optimization program which includes automated ?exposure control, adjustment of the mA and/or kV according to ?patient size and/or use of iterative reconstruction technique. ?  ?CONTRAST:  141m OMNIPAQUE IOHEXOL 300 MG/ML  SOLN ?  ?COMPARISON:  12/28/2020 ?  ?FINDINGS: ?CT CHEST FINDINGS ?  ?Cardiovascular: The heart size is normal. No substantial pericardial ?effusion. Coronary artery calcification is evident. Ascending ?thoracic aorta measures 4.4 cm diameter today compared to 4.2 cm ?previously. ?  ?Mediastinum/Nodes: No mediastinal lymphadenopathy. There is no hilar ?lymphadenopathy. The esophagus has normal imaging features. There is ?no axillary lymphadenopathy. ?  ?Lungs/Pleura: No suspicious pulmonary nodule or mass. No focal ?airspace consolidation. There is no evidence of pleural effusion. ?  ?Musculoskeletal: No worrisome lytic or sclerotic osseous ?abnormality. Thoracolumbar fusion hardware evident. ?  ?CT ABDOMEN PELVIS FINDINGS ?  ?Hepatobiliary: Tiny hypodensity in the dome of the liver is too ?small to characterize but stable in the interval, likely benign. ?There is no evidence for gallstones, gallbladder wall thickening, or ?pericholecystic fluid. No intrahepatic or extrahepatic biliary ?dilation. ?  ?Pancreas: No focal mass lesion. No dilatation of the main duct. No ?intraparenchymal cyst. No  peripancreatic edema. ?  ?Spleen: No splenomegaly. No focal mass lesion. ?  ?Adrenals/Urinary Tract: No adrenal nodule or mass. Kidneys ?unremarkable. No evidence for hydroureter. The urinary bladder ?appears normal for the degree of distention. ?  ?Stomach/Bowel: Stomach is unremarkable. No gastric wall thickening. ?No evidence of outlet obstruction. Duodenum is normally positioned ?as is the ligament of Treitz. No small bowel wall thickening. No ?small bowel dilatation. The terminal ileum is normal. The appendix ?is not well visualized, but there is no edema or inflammation in the ?region of the cecum. No gross colonic mass. No colonic wall ?thickening. ?  ?Vascular/Lymphatic: There is mild atherosclerotic calcification of ?the abdominal aorta without aneurysm. There is no gastrohepatic or ?hepatoduodenal ligament lymphadenopathy. No retroperitoneal or ?mesenteric lymphadenopathy. ?  ?Reproductive: The prostate gland and seminal vesicles are ?unremarkable. ?  ?Other: No intraperitoneal free fluid. ?  ?Musculoskeletal: No worrisome lytic or sclerotic osseous ?abnormality. Thoracolumbar fusion hardware evident. ?  ?IMPRESSION: ?1. Stable exam. No new or progressive findings to suggest metastatic ?disease in the chest, abdomen, or pelvis. ?2. Ascending thoracic aortic aneurysm measures  4.4 cm diameter today ?compared to 4.2 cm previously. Attention on follow-up recommended ?versus annual imaging followup by CTA or MRA. This recommendation ?follows 2010 ACCF/AHA/AATS/ACR/ASA/SCA/SCAI/SIR/STS/SVM Guidelines ?for the Diagnosis and Management of Patients with Thoracic Aortic ?Disease. Circulation. 2010; 121: A677-J736. Aortic aneurysm NOS ?(ICD10-I71.9) . ?3. Aortic Atherosclerosis (ICD10-I70.0).  ? ? ? ? ?Impression and Plan: ? ? ?51 year old man with: ?  ?1.    T2N0 left testicular seminoma diagnosed in 2022 with lymphadenopathy.   ?  ?His disease status was updated at this time and treatment regimens were reviewed.   He continues to be on active surveillance without any evidence of relapsed disease.  Imaging studies obtained on March 27, 2021 were reviewed personally and discussed today.  At this time I recommended

## 2021-04-12 DIAGNOSIS — E291 Testicular hypofunction: Secondary | ICD-10-CM | POA: Diagnosis not present

## 2021-05-06 NOTE — Progress Notes (Addendum)
REFERRING PROVIDER: Zola Button, MD [place], [place] [address]  PRIMARY PROVIDER:  Sueanne Margarita, DO  PRIMARY REASON FOR VISIT:  Encounter Diagnoses  Name Primary?   Malignant neoplasm of left testis, unspecified whether descended or undescended ([place]) Yes   Family history of testicular cancer     HISTORY OF PRESENT ILLNESS:   Kevin Hess, a 51 y.o. male, was seen for a [facility] cancer genetics consultation at the request of Dr. Alen Blew due to a personal and family history of cancer.  Kevin Hess presents to clinic today to discuss the possibility of a hereditary predisposition to cancer, to discuss genetic testing, and to further clarify his future cancer risks, as well as potential cancer risks for family members.   Kevin Hess has a history of right testicular seminoma diagnosed in August 2009 at age 14 and left testicular seminoma diagnosed in May 2021 at age 31.   CANCER HISTORY:  Oncology History  Testis cancer ([place])  04/05/2020 Initial Diagnosis   Testis cancer ([place])    04/05/2020 Cancer Staging   Staging form: Testis, AJCC 8th Edition - Clinical: Stage IIB (cTX, cN2, cM0, S0) - Signed by Wyatt Portela, MD on 04/05/2020 IGCCCG risk status: Good risk    Malignant neoplasm of left testis ([place])  04/05/2020 Initial Diagnosis   Malignant neoplasm of left testis ([place])    04/23/2020 -  Chemotherapy    Patient is on Treatment Plan: TESTICULAR BEP Q21D X 3 CYCLES          Past Medical History:  Diagnosis Date   Allergy    Chronic kidney disease 2020   kidney stones    Complication of anesthesia    History of blood transfusion 1986   History of kidney stones    History of radiation therapy 2009   right testicular cancer   Hyperlipidemia    Malignant neoplasm of other and unspecified testis 2009   testicular right 2009, left 2021    Mononucleosis    PONV (postoperative nausea and vomiting)    Scoliosis     Past Surgical History:  Procedure  Laterality Date   [place] and 1997   due to scoliosis 1986 Thoracic fusion 1997 thoracici hardware removal   EXTRACORPOREAL SHOCK WAVE LITHOTRIPSY  04/2018   IR IMAGING GUIDED PORT INSERTION  04/13/2020   IR REMOVAL TUN ACCESS W/ PORT W/O FL MOD SED  11/05/2020   left vastectomy  2011   with local   ORCHIECTOMY Left 05/31/2019   Procedure: ORCHIECTOMY, RADICAL;  Surgeon: Ceasar Mons, MD;  Location: [facility];  Service: Urology;  Laterality: Left;   right radical ocrhiectomy  2009   UPPER GASTROINTESTINAL ENDOSCOPY     UPPER GI ENDOSCOPY  2010   WISDOM TOOTH EXTRACTION     x 1 with Versed     Social History   Socioeconomic History   Marital status: Married    Spouse name: Not on file   Number of children: 2   Years of education: Not on file   Highest education level: Not on file  Occupational History   Not on file  Tobacco Use   Smoking status: Never   Smokeless tobacco: Never  Vaping Use   Vaping Use: Never used  Substance and Sexual Activity   Alcohol use: Not Currently   Drug use: Never   Sexual activity: Not on file  Other Topics Concern   Not on file  Social History  Narrative   Not on file   Social Determinants of Health   Financial Resource Strain: Not on file  Food Insecurity: Not on file  Transportation Needs: Not on file  Physical Activity: Not on file  Stress: Not on file  Social Connections: Not on file     FAMILY HISTORY:  We obtained a detailed, 4-generation family history.  Significant diagnoses are listed below: Family History  Problem Relation Age of Onset   Colon polyps Brother    Testicular cancer Brother 7   Testicular cancer Brother        dx. late 73s     Kevin Hess 62 year old brother was diagnosed with testicular cancer in his late 75s. He also has a history of a tumor (unknown type) in his chest that required radiation. Kevin Hess 62 year old brother was diagnosed with testicular cancer at age  42 and has a history of a benign carotid body tumor. His father also had a carotid body tumor. Of note, Kevin Hess and his brothers have all been firefighters. Kevin Hess is unaware of previous family history of genetic testing for hereditary cancer risks. There is no reported Ashkenazi Jewish ancestry.   GENETIC COUNSELING ASSESSMENT: Kevin Hess is a 50 y.o. male with a personal and family history of cancer. We, therefore, discussed and recommended the following at today's visit.   DISCUSSION: We discussed that 5 - 10% of cancer is hereditary. However, there is currently no well-established hereditary cause for testicular cancer. We discussed that genetic testing can be beneficial for several reasons including knowing how to follow individuals after completing their treatment and understanding if other family members could be at an increased risk for cancer.   We reviewed the characteristics, features and inheritance patterns of hereditary cancer syndromes. We also discussed genetic testing, including the appropriate family members to test, the process of testing, insurance coverage and turn-around-time for results. We discussed the implications of a negative, positive, carrier and/or variant of uncertain significant result.   We discussed with Kevin Hess that the personal and family history does not meet insurance or NCCN criteria for genetic testing and, therefore, is not highly consistent with a familial hereditary cancer syndrome.  We feel he is at low risk to harbor a gene mutation associated with such a condition. However, Kevin Hess would like to be proactive and pursue genetic testing for the benefit of himself and his family members.   Kevin Hess was offered a common hereditary cancer panel (47 genes) and an expanded pan-cancer panel (77 genes). Kevin Hess was informed of the benefits and limitations of each panel, including that expanded pan-cancer panels contain genes that do not have clear management  guidelines at this point in time.  We also discussed that as the number of genes included on a panel increases, the chances of variants of uncertain significance increases.  After considering the benefits and limitations of each gene panel, Kevin Hess elected to have [facility] CancerNext-Expanded Panel.  The CancerNext-Expanded gene panel offered by [facility] and includes sequencing, rearrangement, and RNA analysis for the following 77 genes: AIP, ALK, APC, ATM, AXIN2, BAP1, BARD1, BLM, BMPR1A, BRCA1, BRCA2, BRIP1, CDC73, CDH1, CDK4, CDKN1B, CDKN2A, CHEK2, CTNNA1, DICER1, FANCC, FH, FLCN, GALNT12, KIF1B, LZTR1, MAX, MEN1, MET, MLH1, MSH2, MSH3, MSH6, MUTYH, NBN, NF1, NF2, NTHL1, PALB2, PHOX2B, PMS2, POT1, PRKAR1A, PTCH1, PTEN, RAD51C, RAD51D, RB1, RECQL, RET, SDHA, SDHAF2, SDHB, SDHC, SDHD, SMAD4, SMARCA4, SMARCB1, SMARCE1, STK11, SUFU, TMEM127, TP53, TSC1, TSC2, VHL  and XRCC2 (sequencing and deletion/duplication); EGFR, EGLN1, HOXB13, KIT, MITF, PDGFRA, POLD1, and POLE (sequencing only); EPCAM and GREM1 (deletion/duplication only).   Kevin Hess may have an out of pocket cost for his genetic testing. We discussed that if his out of pocket cost for testing is over $100, the laboratory will call and confirm whether he wants to proceed with testing.  If the out of pocket cost of testing is less than $100, he will be billed by the genetic testing laboratory.   PLAN: After considering the risks, benefits, and limitations, Kevin Hess provided informed consent to pursue genetic testing and the blood sample was sent to [facility] for analysis of the CancerNext-Expanded Panel. Results should be available within approximately 2-3 weeks' time, at which point they will be disclosed by telephone to Mr. Bouffard, as will any additional recommendations warranted by these results. Kevin Hess will receive a summary of his genetic counseling visit and a copy of his results once available. This information will also be available in  Epic.   Mr. Cassada questions were answered to his satisfaction today. Our contact information was provided should additional questions or concerns arise. Thank you for the referral and allowing [place] to share in the care of your patient.   Lucille Passy, MS, [facility] Genetic Counselor [email or web address] (P) [phone]  The patient was seen for a total of 40 minutes in face-to-face genetic counseling. The patient was seen alone.  Drs. Lindi Adie and/or Burr Medico were available to discuss this case as needed.   _______________________________________________________________________ For Office Staff:  Number of people involved in session: 1 Was an Intern/ student involved with case: no

## 2021-05-07 ENCOUNTER — Inpatient Hospital Stay (HOSPITAL_BASED_OUTPATIENT_CLINIC_OR_DEPARTMENT_OTHER): Payer: BC Managed Care – PPO | Admitting: Genetic Counselor

## 2021-05-07 ENCOUNTER — Other Ambulatory Visit: Payer: Self-pay | Admitting: Genetic Counselor

## 2021-05-07 ENCOUNTER — Other Ambulatory Visit: Payer: Self-pay

## 2021-05-07 ENCOUNTER — Inpatient Hospital Stay: Payer: BC Managed Care – PPO | Attending: Oncology

## 2021-05-07 DIAGNOSIS — C6292 Malignant neoplasm of left testis, unspecified whether descended or undescended: Secondary | ICD-10-CM | POA: Diagnosis not present

## 2021-05-07 DIAGNOSIS — Z923 Personal history of irradiation: Secondary | ICD-10-CM | POA: Insufficient documentation

## 2021-05-07 DIAGNOSIS — Z9221 Personal history of antineoplastic chemotherapy: Secondary | ICD-10-CM | POA: Insufficient documentation

## 2021-05-07 DIAGNOSIS — Z8043 Family history of malignant neoplasm of testis: Secondary | ICD-10-CM

## 2021-05-07 DIAGNOSIS — Z1379 Encounter for other screening for genetic and chromosomal anomalies: Secondary | ICD-10-CM

## 2021-05-07 DIAGNOSIS — Z8547 Personal history of malignant neoplasm of testis: Secondary | ICD-10-CM | POA: Diagnosis not present

## 2021-05-07 LAB — CMP (CANCER CENTER ONLY)
ALT: 16 U/L (ref 0–44)
AST: 15 U/L (ref 15–41)
Albumin: 4.5 g/dL (ref 3.5–5.0)
Alkaline Phosphatase: 36 U/L — ABNORMAL LOW (ref 38–126)
Anion gap: 4 — ABNORMAL LOW (ref 5–15)
BUN: 20 mg/dL (ref 6–20)
CO2: 30 mmol/L (ref 22–32)
Calcium: 10 mg/dL (ref 8.9–10.3)
Chloride: 103 mmol/L (ref 98–111)
Creatinine: 0.93 mg/dL (ref 0.61–1.24)
GFR, Estimated: >60 mL/min (ref >60)
Glucose, Bld: 111 mg/dL — ABNORMAL HIGH (ref 70–99)
Potassium: 4.8 mmol/L (ref 3.5–5.1)
Sodium: 137 mmol/L (ref 135–145)
Total Bilirubin: 0.5 mg/dL (ref 0.3–1.2)
Total Protein: 7.3 g/dL (ref 6.5–8.1)

## 2021-05-07 LAB — CBC WITH DIFFERENTIAL (CANCER CENTER ONLY)
Abs Immature Granulocytes: 0.01 10*3/uL (ref 0.00–0.07)
Basophils Absolute: 0 10*3/uL (ref 0.0–0.1)
Basophils Relative: 1 %
Eosinophils Absolute: 0 10*3/uL (ref 0.0–0.5)
Eosinophils Relative: 1 %
HCT: 44.2 % (ref 39.0–52.0)
Hemoglobin: 15.2 g/dL (ref 13.0–17.0)
Immature Granulocytes: 0 %
Lymphocytes Relative: 32 %
Lymphs Abs: 1 10*3/uL (ref 0.7–4.0)
MCH: 29.9 pg (ref 26.0–34.0)
MCHC: 34.4 g/dL (ref 30.0–36.0)
MCV: 87 fL (ref 80.0–100.0)
Monocytes Absolute: 0.3 10*3/uL (ref 0.1–1.0)
Monocytes Relative: 11 %
Neutro Abs: 1.7 10*3/uL (ref 1.7–7.7)
Neutrophils Relative %: 55 %
Platelet Count: 154 10*3/uL (ref 150–400)
RBC: 5.08 MIL/uL (ref 4.22–5.81)
RDW: 14.6 % (ref 11.5–15.5)
WBC Count: 3 10*3/uL — ABNORMAL LOW (ref 4.0–10.5)
nRBC: 0 % (ref 0.0–0.2)

## 2021-05-07 LAB — LACTATE DEHYDROGENASE: LDH: 116 U/L (ref 98–192)

## 2021-05-07 LAB — GENETIC SCREENING ORDER

## 2021-05-08 ENCOUNTER — Encounter: Payer: Self-pay | Admitting: Oncology

## 2021-05-08 ENCOUNTER — Encounter: Payer: Self-pay | Admitting: Genetic Counselor

## 2021-05-08 LAB — AFP TUMOR MARKER: AFP, Serum, Tumor Marker: 3 ng/mL (ref 0.0–8.4)

## 2021-05-08 LAB — BETA HCG QUANT (REF LAB): hCG Quant: <1 m[IU]/mL (ref 0–3)

## 2021-05-30 ENCOUNTER — Ambulatory Visit: Payer: Self-pay | Admitting: Genetic Counselor

## 2021-05-30 NOTE — Progress Notes (Addendum)
HPI:   [name] was previously seen in the [place] clinic due to a personal and family history of cancer and concerns regarding a hereditary predisposition to cancer. Please refer to our prior cancer genetics clinic note for more information regarding our discussion, assessment and recommendations, at the time. [name] recent genetic test results were disclosed to him, as were recommendations warranted by these results. These results and recommendations are discussed in more detail below.  CANCER HISTORY:  Oncology History  Testis cancer ([place])  04/05/2020 Initial Diagnosis   Testis cancer ([place])    04/05/2020 Cancer Staging   Staging form: Testis, AJCC 8th Edition - Clinical: Stage IIB (cTX, cN2, cM0, S0) - Signed by Wyatt Portela, MD on 04/05/2020 IGCCCG risk status: Good risk    Malignant neoplasm of left testis ([place])  04/05/2020 Initial Diagnosis   Malignant neoplasm of left testis ([place])    04/23/2020 -  Chemotherapy    Patient is on Treatment Plan: TESTICULAR BEP Q21D X 3 CYCLES         FAMILY HISTORY:  We obtained a detailed, 4-generation family history.  Significant diagnoses are listed below:      Family History  Problem Relation Age of Onset   Colon polyps Brother     Testicular cancer Brother 90   Testicular cancer Brother          dx. late 63s      [name] 67 year old brother was diagnosed with testicular cancer in his late 61s. He also has a history of a tumor (unknown type) in his chest that required radiation. [name] 74 year old brother was diagnosed with testicular cancer at age 8 and has a history of a benign carotid body paraganglioma diagnosed around age 54. His father also had a carotid body tumor. Of note, [name] and his brothers have all been firefighters. [name] is unaware of previous family history of genetic testing for hereditary cancer risks. There is no reported Ashkenazi Jewish ancestry.   GENETIC TEST RESULTS:  Mr.  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 Specifically, this variant is p.R46*.  The test report has been scanned into EPIC and is located under the Molecular Pathology section of the Results Review tab.  A portion of the result report is included below for reference. Genetic testing reported out on 05/29/2021.       Genetic testing identified a variant of uncertain significance (VUS) in the ATM gene (p.R2854C) and the BARD1 gene (p.A33P).  At this time, it is unknown if the variants are associated with an increased risk for cancer or if they are benign, but most uncertain variants are reclassified to benign. It should not be used to make medical management decisions. With time, we suspect the laboratory will determine the significance of the variants, if any. If the laboratory reclassifies the variants, we will attempt to contact [name] to discuss it further.   SDHB Gene: The SDHB gene is associated with Hereditary Paraganglioma Pheochromocytoma (PGL/PCC) Syndrome. Individuals with a pathogenic variant in the SDHB gene are considered to be at an increased risk for paragangliomas or pheochromocytomas, renal cell carcinoma (RCC), gastrointestinal stromal tumors (GIST), and possibly pituitary adenomas.   Paragangliomas/Pheochromocytomas Paragangliomas (PGL) are rare, typically adult-onset, typically benign neuroendocrine tumors that arise from paraganglia. Paraganglia are a collection of neuroendocrine tissues that are distributed throughout the body, from the middle ear and skull base (  called head-and-neck paragangliomas) to the pelvis. PGLs that occur in the adrenal glands are called pheochromocytomas [facility]). These lesions can cause excessive production of adrenal hormones, resulting in hypertension, headaches, tachycardia, anxiety, and sweaty or clammy skin in some individuals.   Gastrointestinal stromal tumors Gastrointestinal stromal tumors  (GIST) are rare, typically adult-onset sarcomas along the gastrointestinal tract that may be either benign or malignant. GISTs can develop anywhere along the GI tract, which includes the esophagus, stomach, gallbladder, liver, small intestine, colon, rectum, anus, and lining of the gut. More than half of GISTs start in the stomach.  Risk Figures for SDHB:  23-57% risk for paraganglioma-pheochromocytoma (PGL-PCC) SDHB-associated PGL-PCC are characterized by a higher risk of malignancy than other SDHx genes 4-14% lifetime risk for renal cell carcinoma (RCC) Increased risk for gastrointestinal stromal tumors (GIST)  Penetrance in SDHB-related PGL-PCC is incomplete and variable expressivity is observed. Therefore, cancer risks will differ based on individual and family history.   Management Recommendations: There is currently no clear consensus regarding a screening and surveillance protocol. However, it has been suggested that those with a SDHB pathogenic variant consider the following:  Screening should begin around 46-31 years of age.  Annual physical exam including blood pressure measurement Annual biochemical evaluation including plasma-free fractionated metanephrines (preferred) and/or 24-hour urine fractioned metanephrines and catecholamines  Follow-up with MRI if metanephrine and/or catecholamine levels are elevated MRI (preferred) or CT of the abdomen, thorax, and pelvis every 2-3 years MRI (preferred) or CT of the skull base and neck every 2-3 years Periodic 123I-MIBG (metaiodobenzylguanidine) scintigraphy every 2-4 years No additional imaging is recommended for RCC, GIST, or pituitary adenomas. However, RCC and GIST should be searched for on imaging performed for PGL-PCC screening.   This information is based on current understanding of the gene and may change in the future.  Implications for Family Members: Pathogenic variants in SDHB resulting in PGL-PCC have autosomal dominant  inheritance. This means that an individual with a pathogenic variant has a 50% chance of passing the condition on to their offspring. Identification of a pathogenic variant allows for the recognition of at-risk relatives who can pursue testing for the familial variant.   Those with a pathogenic variant in SDHB are also carriers of mitochondrial complex II deficiency which is a rare, highly variable inborn error of metabolism. Clinical features may involve either early-onset multisystem involvement of the brain, heart, and muscle or later-onset isolated cardiac or muscle involvement. For there to be a risk of this condition in offspring, both parents have to have a pathogenic variant in SDHB. In such a case, the risk of having an affected child is 25%. Carrier testing for the patient's partner for mutations in SDHB could be considered if it would inform reproductive decision-making and/or risk assessment and management.   Family members are encouraged to consider genetic testing for this familial pathogenic variant. Of note, this condition has been associated with risks for tumors in childhood. We recommend that children in the family be tested by 78 years of age. Family members may contact our office at [phone] for more information or to schedule an appointment.  Complimentary testing for the familial variant is available for 90 days from the report date (05/29/2021).  Family members who live outside of the area are encouraged to find a genetic counselor in their area by visiting: [email or web address].  Resources: [employer] [place] at www.pheopara.org is a TEFL teacher for both physicians and patients dealing with neuroendocrine diseases.   Our contact number  was provided. Mr. Hibberd questions were answered to his satisfaction, and he knows he is welcome to call us at anytime with additional questions or concerns.   Lucille Passy, MS, [facility] Genetic  Counselor [email or web address] (P) [phone]

## 2021-05-31 ENCOUNTER — Telehealth: Payer: Self-pay | Admitting: Genetic Counselor

## 2021-05-31 NOTE — Telephone Encounter (Signed)
I attempted to contact Kevin Hess to discuss his genetic testing results (77 genes). I left a voicemail requesting he call me back at [phone].  Lucille Passy, MS, [facility] Genetic Counselor [email or web address] (P) [phone]

## 2021-06-03 ENCOUNTER — Encounter: Payer: Self-pay | Admitting: Genetic Counselor

## 2021-06-03 ENCOUNTER — Telehealth: Payer: Self-pay | Admitting: Genetic Counselor

## 2021-06-03 ENCOUNTER — Ambulatory Visit: Payer: Self-pay | Admitting: Genetic Counselor

## 2021-06-03 ENCOUNTER — Encounter: Payer: Self-pay | Admitting: Oncology

## 2021-06-03 DIAGNOSIS — Z1379 Encounter for other screening for genetic and chromosomal anomalies: Secondary | ICD-10-CM | POA: Insufficient documentation

## 2021-06-03 DIAGNOSIS — Z1589 Genetic susceptibility to other disease: Secondary | ICD-10-CM | POA: Insufficient documentation

## 2021-06-03 NOTE — Progress Notes (Signed)
Error

## 2021-06-03 NOTE — Telephone Encounter (Signed)
I contacted Mr. Torr to discuss his genetic testing results. Mr. 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 Specifically, this variant is p.R46*.. Of note, a variant of uncertain significance was identified in the ATM and BARD1 genes. Detailed clinic note was written on 05/30/2021.  The test report has been scanned into EPIC and is located under the Molecular Pathology section of the Results Review tab.  A portion of the result report is included below for reference.   Lucille Passy, MS, [facility] Genetic Counselor [email or web address] (P) [phone]

## 2021-06-05 ENCOUNTER — Encounter: Payer: Self-pay | Admitting: Genetic Counselor

## 2021-06-18 ENCOUNTER — Other Ambulatory Visit: Payer: Self-pay | Admitting: Nurse Practitioner

## 2021-07-18 DIAGNOSIS — H1013 Acute atopic conjunctivitis, bilateral: Secondary | ICD-10-CM | POA: Diagnosis not present

## 2021-07-18 DIAGNOSIS — H04123 Dry eye syndrome of bilateral lacrimal glands: Secondary | ICD-10-CM | POA: Diagnosis not present

## 2021-07-24 DIAGNOSIS — E291 Testicular hypofunction: Secondary | ICD-10-CM | POA: Diagnosis not present

## 2021-07-24 DIAGNOSIS — I700 Atherosclerosis of aorta: Secondary | ICD-10-CM | POA: Diagnosis not present

## 2021-07-24 DIAGNOSIS — D447 Neoplasm of uncertain behavior of aortic body and other paraganglia: Secondary | ICD-10-CM | POA: Diagnosis not present

## 2021-07-25 ENCOUNTER — Telehealth: Payer: Self-pay | Admitting: Oncology

## 2021-07-25 DIAGNOSIS — E291 Testicular hypofunction: Secondary | ICD-10-CM | POA: Diagnosis not present

## 2021-07-25 DIAGNOSIS — D447 Neoplasm of uncertain behavior of aortic body and other paraganglia: Secondary | ICD-10-CM | POA: Diagnosis not present

## 2021-07-25 NOTE — Telephone Encounter (Signed)
Called patient regarding upcoming September appointments, patient is notified.  

## 2021-08-14 ENCOUNTER — Telehealth: Payer: Self-pay | Admitting: *Deleted

## 2021-08-14 ENCOUNTER — Encounter: Payer: Self-pay | Admitting: Oncology

## 2021-08-14 NOTE — Telephone Encounter (Signed)
Lab orders for upcoming September faxed to [facility] per patient request, fax confirmation received.

## 2021-08-23 DIAGNOSIS — D447 Neoplasm of uncertain behavior of aortic body and other paraganglia: Secondary | ICD-10-CM | POA: Diagnosis not present

## 2021-08-23 DIAGNOSIS — I700 Atherosclerosis of aorta: Secondary | ICD-10-CM | POA: Diagnosis not present

## 2021-08-23 DIAGNOSIS — E291 Testicular hypofunction: Secondary | ICD-10-CM | POA: Diagnosis not present

## 2021-08-26 ENCOUNTER — Other Ambulatory Visit: Payer: Self-pay | Admitting: Internal Medicine

## 2021-08-26 DIAGNOSIS — D447 Neoplasm of uncertain behavior of aortic body and other paraganglia: Secondary | ICD-10-CM

## 2021-08-29 DIAGNOSIS — M5451 Vertebrogenic low back pain: Secondary | ICD-10-CM | POA: Diagnosis not present

## 2021-08-29 DIAGNOSIS — M5136 Other intervertebral disc degeneration, lumbar region: Secondary | ICD-10-CM | POA: Diagnosis not present

## 2021-08-29 DIAGNOSIS — M7062 Trochanteric bursitis, left hip: Secondary | ICD-10-CM | POA: Diagnosis not present

## 2021-09-05 DIAGNOSIS — D447 Neoplasm of uncertain behavior of aortic body and other paraganglia: Secondary | ICD-10-CM | POA: Diagnosis not present

## 2021-09-10 ENCOUNTER — Other Ambulatory Visit: Payer: BC Managed Care – PPO

## 2021-09-17 DIAGNOSIS — L648 Other androgenic alopecia: Secondary | ICD-10-CM | POA: Diagnosis not present

## 2021-09-17 DIAGNOSIS — L720 Epidermal cyst: Secondary | ICD-10-CM | POA: Diagnosis not present

## 2021-09-18 ENCOUNTER — Other Ambulatory Visit: Payer: Self-pay | Admitting: Genetic Counselor

## 2021-09-23 DIAGNOSIS — C6292 Malignant neoplasm of left testis, unspecified whether descended or undescended: Secondary | ICD-10-CM | POA: Diagnosis not present

## 2021-09-24 ENCOUNTER — Ambulatory Visit: Payer: BC Managed Care – PPO | Attending: Genetic Counselor | Admitting: Genetic Counselor

## 2021-09-24 ENCOUNTER — Encounter: Payer: BC Managed Care – PPO | Admitting: Genetic Counselor

## 2021-09-26 ENCOUNTER — Ambulatory Visit (INDEPENDENT_AMBULATORY_CARE_PROVIDER_SITE_OTHER): Payer: BC Managed Care – PPO

## 2021-09-26 ENCOUNTER — Encounter: Payer: Self-pay | Admitting: Oncology

## 2021-09-26 ENCOUNTER — Other Ambulatory Visit: Payer: BC Managed Care – PPO

## 2021-09-26 DIAGNOSIS — I712 Thoracic aortic aneurysm, without rupture, unspecified: Secondary | ICD-10-CM | POA: Diagnosis not present

## 2021-09-26 DIAGNOSIS — K769 Liver disease, unspecified: Secondary | ICD-10-CM | POA: Diagnosis not present

## 2021-09-26 DIAGNOSIS — I2510 Atherosclerotic heart disease of native coronary artery without angina pectoris: Secondary | ICD-10-CM | POA: Diagnosis not present

## 2021-09-26 DIAGNOSIS — I700 Atherosclerosis of aorta: Secondary | ICD-10-CM | POA: Diagnosis not present

## 2021-09-26 DIAGNOSIS — C6212 Malignant neoplasm of descended left testis: Secondary | ICD-10-CM

## 2021-09-26 DIAGNOSIS — K5730 Diverticulosis of large intestine without perforation or abscess without bleeding: Secondary | ICD-10-CM | POA: Diagnosis not present

## 2021-09-26 DIAGNOSIS — C6290 Malignant neoplasm of unspecified testis, unspecified whether descended or undescended: Secondary | ICD-10-CM | POA: Diagnosis not present

## 2021-09-26 MED ORDER — IOHEXOL 300 MG/ML  SOLN
100.0000 mL | Freq: Once | INTRAMUSCULAR | Status: AC | PRN
  Administered 2021-09-26: 100 mL via INTRAVENOUS

## 2021-09-26 NOTE — Progress Notes (Signed)
Referring Provider: Lucille Hess, GC (ROUTE TO Kevin Raid, MD)   Referral Reason Kevin Hess was referred for genetic consult and testing of aortopathies by his cancer genetic counsellor when his oncologist noted that a CT of his chest-abdomen-pelvis (03/27/2021) detected ascending thoracic aorta of 4.4 cm compared to 4.3 cm in 2021.    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 Also used to be a Airline pilot.  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 He underwent cancer genetic testing that detected a pathogenic variant in the SDHB gene (p.R46*) that is associated with Hereditary Paraganglioma Pheochromocytoma (PGL/PCC) Syndrome. This was an incidental finding as he does not have a paraganglioma but reports that his brother (III.3) was diagnosed with it.  As part of his cancer workup, he underwent a CT of his chest-abdomen-pelvis on 03/27/2021 that noted his ascending thoracic aorta at 4.4 cm compared to 4.2 cm in 2022. He tells me that in early 2000s he had a normal CT scan of his chest, abdomen and pelvis. In 2021, he was found to have an ascending thoracic aortic aorta of 4.3 cm and is followed by [facility] cardiothoracic surgeon, Dr. Gilford Hess having seen him last on 07/06/20.   Traditional Risk Factors Kevin Hess denies having the typical risk factors of HTN, prior aortic dissection that are associated with an aortic dilation. Does report having a history of hypercholesterolemia.  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, firefighter  Brother-2 III.2 61  HL, firefighter  Brother-3 III.3 58   Testicular cancer @ 35, Paraganglioma @ 27, HL, firefighter  Son IV.4 19 None   Daughter IV.5 64 None   Nephew, nieces IV.1-IV.3 35, 37, 37 None         Father II.5 36 M.I @ 82 Stents, HTN, HL  Paternal Aunts, 2 II.1-II.2 Deceased Heart disease Died of old age  Paternal 53, 2 II.3-II.4 Deceased None II.3- died of old age @ 59s II.4- died of Parkinson's disease @ 32s  Paternal grandfather 1.1 Deceased  Died of old age @ 19  Paternal grandmother I.2 Deceased  Died of dementia @ 36        Mother II.6 70 None HTN, HL  Maternal Uncle, Aunt II.7, II.8 87, 16  Aunt w/ HTN, HL  Maternal uncle II.9 Deceased Died @ 45 of heart disease Poor lifestyle- smoker, alcoholic  Maternal grandfather I.3 Deceased Died @ 10 of M.I.   Maternal grandmother I.4 Deceased Died @ 54 of M.Kevin Hess Genetic Consultation Notes  Informed Kevin Hess that while congenital conditions account for nearly 70% of aortic dilatations (AoD), idiopathic and genetic disorders account for 11% and 17% of AoD, respectively. Kevin Hess was counseled on the genetics of syndromes that present with aortic aneurysms and dissections, specifically Marfan syndrome (MFS), Loeys-Dietz syndrome (LDS), Vascular Ehlers-Danlos Syndrome (vEDS) and other non-syndromic familial forms of thoracic aortic aneurysms and dissection (FTAAD). I explained to him that aortopathies are an autosomal dominant condition. If he has a genetic aortopathy then his children have a 50% chance of inheriting this condition. Also informed him that nearly of the high rate of de novo  mutations in the syndromic aortopathy conditions. Considering that there is no overt family history of aortoapthies or sudden death in his family, it is highly likely that he may have a de novo mutation or has an idiopathic condition. Kevin Hess verbalized understanding.   I discussed incomplete penetrance associated with this condition i.e. not all individuals harboring a mutation will present  clinically, and age-related penetrance where clinical presentation increases with advanced age. I also reviewed variable expression and emphasized that this condition can express at any age at any level of severity in the family. Hence, it is important for his first-degree relatives to undergo CTA screening for aortic aneurysms.   We walked through the process of genetic testing.   I explained to him that genetic testing is a probabilistic test dependent upon his age and severity of presentation, presence of risk factors and importantly family history of aortic aneurysms or sudden death in first-degree relatives.  The potential outcomes of genetic testing and subsequent management of at-risk family members were discussed so as to manage expectations-  I explained to him that if a mutation is not identified, then all first-degree relatives should undergo cardiac screening for aortic aneurysms. While nearly 11% of AoD cases are idiopathic, a negative test result can be due to limitations of the genetic test.   There is also the likelihood of identifying a "Variant of unknown significance". This result means that the variant has not been detected in a statistically significant number of patients and/or functional studies have not been performed to verify its pathogenicity. This VUS can be tested in the family to see if it segregates with disease. If a VUS is found, first-degree relatives should get screened.  If a pathogenic variant is reported, then his first-degree family members can get tested for this variant. If they test positive, it is likely they will develop an aortopathy. In light of variable expression and incomplete penetrance associated with this condition, it is not possible to predict when they will manifest clinically with an aneurysm. It is recommended that family members that test positive for the familial pathogenic variant pursue clinical screening.  Impression  In summary, Kevin Hess  presents with an ascending thoracic aortic dilatation of 4.4 cm at age 51. However, there is no family history of aortopathy or sudden death from hollow organ rupture. Genetic testing for the genes implicated in aortopathies is recommended. This test should include the major genes that contribute to MFS, LDS, vEDS and FTAAD. The genetic test will help confirm his diagnosis and identify the genetic basis of his disease. In addition, confirming his diagnosis will allow appropriate and timely intervention as needed for his condition as guidelines for threshold for surgical intervention in patients with Kevin Hess syndrome is lower (>4.52m on TEE) than those with MFS (>4.5 -5.0 mm, based on risk factors)  Since this is an autosomal dominant condition, a positive test result will help identify first-degree family members that may harbor the mutation and are at risk of developing this condition. Appropriate cardiology follow-up and lifestyle management can then be directed to those genotype-positive family members.   In addition, we discussed the protections afforded by the Genetic Information Non-Discrimination Act (GINA). I explained to him that GINA protects him from losing employment or health insurance based on his genotype. However, these protections do not cover life insurance and disability. He verbalized understanding of this and states that his kids do not have life insurance.  Please note that the patient has not been counseled  in this visit on personal, cultural or ethical issues that he may face due to his heart condition.   Plan After a thorough discussion of the risk and benefits of genetic testing for HCM, Kevin Hess states his intent to pursue genetic testing for aortopathies. He is willing to have his test performed at [facility] and is agreeable with the possible loss of genetic data privacy. I will reach out to Dr. Cyndia Bent to facilitate the test order for aortopathies. Kamuela states that he will  also reach out to him regarding this.    Lattie Corns, Ph.D, [facility] Clinical Molecular Geneticist

## 2021-09-27 DIAGNOSIS — I700 Atherosclerosis of aorta: Secondary | ICD-10-CM | POA: Diagnosis not present

## 2021-09-27 DIAGNOSIS — E785 Hyperlipidemia, unspecified: Secondary | ICD-10-CM | POA: Diagnosis not present

## 2021-10-02 ENCOUNTER — Inpatient Hospital Stay: Payer: BC Managed Care – PPO | Attending: Oncology | Admitting: Oncology

## 2021-10-02 ENCOUNTER — Encounter: Payer: BC Managed Care – PPO | Admitting: Genetic Counselor

## 2021-10-02 ENCOUNTER — Other Ambulatory Visit: Payer: Self-pay

## 2021-10-02 VITALS — BP 111/77 | HR 90 | Temp 97.6°F | Resp 16 | Ht 72.0 in | Wt 206.7 lb

## 2021-10-02 DIAGNOSIS — Z9079 Acquired absence of other genital organ(s): Secondary | ICD-10-CM | POA: Diagnosis not present

## 2021-10-02 DIAGNOSIS — C6292 Malignant neoplasm of left testis, unspecified whether descended or undescended: Secondary | ICD-10-CM

## 2021-10-02 DIAGNOSIS — Z8547 Personal history of malignant neoplasm of testis: Secondary | ICD-10-CM | POA: Insufficient documentation

## 2021-10-02 DIAGNOSIS — Z923 Personal history of irradiation: Secondary | ICD-10-CM | POA: Diagnosis not present

## 2021-10-02 NOTE — Progress Notes (Signed)
Hematology and Oncology   Kevin Hess [id number] 1970/11/08 51 y.o. 10/02/2021 10:29 AM Kevin Nutting, DO    Provider's location: Office    Principle Diagnosis: 51 year old man with left testicular nonseminoma presented with stage IIb with lymphadenopathy in May 2021.     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        Prior Therapy:  Status post right radical orchiectomy with the pathology showed a 2.2 cm seminoma in August 2009.   He is status post adjuvant radiation therapy to the pelvis with a total of 26 Gy in 16 fractions concluded in October 2009. He is status post radical orchiectomy on the left completed on Jun 10, 2019.  The final pathology showed a 2.5 cm pure seminoma that is organ confined. BEP chemotherapy started on April 23, 2020.  He completed 3 cycles of therapy on June 19, 2020.     Current therapy: Active surveillance.   Interim History: Kevin Hess is here for a follow-up.  Since last visit, he reports feeling well without any major complaints.  He denies any nausea, vomiting or abdominal pain.  He denies any weight loss or appetite changes.  Continues to exercise regularly without any difficulties doing so.  Denies any worsening neuropathy or fatigue.   Medications: Reviewed without changes. Current Outpatient Medications  Medication Sig Dispense Refill   acetaminophen (TYLENOL) 500 MG tablet Take 1,000 mg by mouth every 6 (six) hours as needed for moderate pain.     Bacillus Coagulans-Inulin (ALIGN PREBIOTIC-PROBIOTIC PO) Take 1 tablet by mouth daily.     cetirizine (ZYRTEC) 10 MG tablet Take 10 mg by mouth daily.     Cholecalciferol (VITAMIN D3) 5000 UNITS CAPS Take 5,000 Units by mouth daily. Takes Monday thru friday     cyclobenzaprine (FLEXERIL) 10 MG tablet Take 10 mg by mouth 3 (three) times daily as needed for muscle spasms. As needed     fluticasone (FLONASE) 50 MCG/ACT nasal spray Place 2  sprays into both nostrils daily.     rosuvastatin (CRESTOR) 10 MG tablet Take 10 mg by mouth daily.     testosterone cypionate (DEPOTESTOSTERONE CYPIONATE) 200 MG/ML injection Inject 80 mg into the muscle See admin instructions. Every 9 days     No current facility-administered medications for this visit.     Allergies:  Allergies  Allergen Reactions   Penicillins     Rash age 74    Physical exam    Blood pressure 111/77, pulse 90, temperature 97.6 F (36.4 C), temperature source Temporal, resp. rate 16, height 6' (1.829 m), weight 206 lb 11.2 oz (93.8 kg), SpO2 100 %.     ECOG 0   General appearance: Comfortable appearing without any discomfort Head: Normocephalic without any trauma Oropharynx: Mucous membranes are moist and pink without any thrush or ulcers. Eyes: Pupils are equal and round reactive to light. Lymph nodes: No cervical, supraclavicular, inguinal or axillary lymphadenopathy.   Heart:regular rate and rhythm.  S1 and S2 without leg edema. Lung: Clear without any rhonchi or wheezes.  No dullness to percussion. Abdomin: Soft, nontender, nondistended with good bowel sounds.  No hepatosplenomegaly. Musculoskeletal: No joint deformity or effusion.  Full range of motion noted. Neurological: No deficits noted on motor, sensory and deep tendon reflex exam. Skin: No petechial rash or dryness.  Appeared moist.        Lab Results: Lab Results  Component Value Date   WBC 3.0 (L) 05/07/2021  HGB 15.2 05/07/2021   HCT 44.2 05/07/2021   MCV 87.0 05/07/2021   PLT 154 05/07/2021     Chemistry      Component Value Date/Time   NA 137 05/07/2021 1139   NA 140 10/09/2016 1428   K 4.8 05/07/2021 1139   K 4.3 10/09/2016 1428   CL 103 05/07/2021 1139   CL 104 03/02/2012 1043   CO2 30 05/07/2021 1139   CO2 27 10/09/2016 1428   BUN 20 05/07/2021 1139   BUN 10.3 10/09/2016 1428   CREATININE 0.93 05/07/2021 1139   CREATININE 0.8 10/09/2016 1428      Component  Value Date/Time   CALCIUM 10.0 05/07/2021 1139   CALCIUM 10.2 10/09/2016 1428   ALKPHOS 36 (L) 05/07/2021 1139   ALKPHOS 55 10/09/2016 1428   AST 15 05/07/2021 1139   AST 19 10/09/2016 1428   ALT 16 05/07/2021 1139   ALT 17 10/09/2016 1428   BILITOT 0.5 05/07/2021 1139   BILITOT 0.42 10/09/2016 1428       IMPRESSION: 1. Stable examination without new or progressive findings to suggest active metastatic disease in the chest, abdomen or pelvis. 2. Ascending thoracic aortic aneurysm measures 4.3 cm, similar to prior examination. Attention on follow-up oncologic studies recommend versus annual imaging by CTA or MRA. 3.  Aortic Atherosclerosis (ICD10-I70.0).   Impression and Plan:   51 year old man with:   1.    Left testicular seminoma diagnosed in 2021.  He developed stage II disease in 2022.   He is currently on active surveillance after completing chemotherapy for salvage purposes and achieving a complete response.  CT scan on 09/26/2021 was personally reviewed and showed no evidence of metastatic disease at this time.  Laboratory data obtained on 09/23/2021 at [place] diagnostic lab were also reviewed showed normal kidney function as well as tumor markers.  His hemoglobin was normal.  White cell count was 3.0 which is consistent with his baseline.  Risks and benefits of continued active surveillance and risk of relapse was discussed today in detail.  His salvage rate with chemotherapy is very high with the risk of relapse likely in the 1 to 5%.  However, risk of relapse could be extended or seminoma up to 10 years.  For the time being I recommended continued imaging studies every 6 months to complete 2 years and annually after that 5 years.  After that a CT scan every other year would be reasonable to complete 10 years.   2.  Right testicular seminoma: No evidence of relapse from his right testicular cancer.   3.   Age appropriate cancer screening and survivorship: We continue to  discuss risk of exposure to chemotherapy and survivorship.  The risk of leukemia and cardiovascular complications were discussed.  Optimizing his cardiovascular status and minimizing his risk was the main emphasis.  4.  Genetic considerations: He was found to have pathological variant in the SDHB gene.  He would be at increased risk for aortopathies and is to follow-up with Dr. Cyndia Bent due to history of aneurysm.   5.  Follow-up: In 6 months for a follow-up visit.   30  minutes were spent on this visit.  The time was dedicated to reviewing laboratory data, disease status update, treatment choices and future plan of care discussion.    Zola Button, MD 10/02/2021 10:29 AM

## 2021-10-04 DIAGNOSIS — E785 Hyperlipidemia, unspecified: Secondary | ICD-10-CM | POA: Diagnosis not present

## 2021-10-08 ENCOUNTER — Encounter: Payer: Self-pay | Admitting: Oncology

## 2021-10-08 DIAGNOSIS — D447 Neoplasm of uncertain behavior of aortic body and other paraganglia: Secondary | ICD-10-CM | POA: Diagnosis not present

## 2021-11-05 DIAGNOSIS — R8290 Unspecified abnormal findings in urine: Secondary | ICD-10-CM | POA: Diagnosis not present

## 2021-11-05 DIAGNOSIS — N401 Enlarged prostate with lower urinary tract symptoms: Secondary | ICD-10-CM | POA: Diagnosis not present

## 2021-11-05 DIAGNOSIS — N138 Other obstructive and reflux uropathy: Secondary | ICD-10-CM | POA: Diagnosis not present

## 2021-11-05 DIAGNOSIS — N200 Calculus of kidney: Secondary | ICD-10-CM | POA: Diagnosis not present

## 2021-11-26 DIAGNOSIS — M67911 Unspecified disorder of synovium and tendon, right shoulder: Secondary | ICD-10-CM | POA: Diagnosis not present

## 2021-12-23 DIAGNOSIS — D225 Melanocytic nevi of trunk: Secondary | ICD-10-CM | POA: Diagnosis not present

## 2021-12-23 DIAGNOSIS — E663 Overweight: Secondary | ICD-10-CM | POA: Diagnosis not present

## 2021-12-23 DIAGNOSIS — L821 Other seborrheic keratosis: Secondary | ICD-10-CM | POA: Diagnosis not present

## 2021-12-23 DIAGNOSIS — D2271 Melanocytic nevi of right lower limb, including hip: Secondary | ICD-10-CM | POA: Diagnosis not present

## 2021-12-27 ENCOUNTER — Encounter: Payer: Self-pay | Admitting: Genetic Counselor

## 2022-01-28 ENCOUNTER — Ambulatory Visit: Payer: BC Managed Care – PPO | Attending: Genetic Counselor | Admitting: Genetic Counselor

## 2022-01-28 ENCOUNTER — Telehealth: Payer: Self-pay | Admitting: Oncology

## 2022-01-28 DIAGNOSIS — I7121 Aneurysm of the ascending aorta, without rupture: Secondary | ICD-10-CM

## 2022-01-28 NOTE — Telephone Encounter (Signed)
Called patient regarding providers departure, patient is notified. Rescheduled with new provider.

## 2022-01-28 NOTE — Progress Notes (Signed)
Post-test Genetic Consultation notes  [name] is here today, virtually, for his post-test genetic consult of aortopathy gene panel testing. Patient identity was confirmed with two unique identifiers.   He notes no changes to his medical history since we last met. We then reviewed his pedigree and he reports no medical changes in his family members.   I informed Will that he does not have a pathogenic variant for the inherited aortopathies, namely vascular EDS, Loeys-Dietz syndrome and FTAAD.  I explained to him that this indicates he does not have a mutation in the genes implicated in aortopathies and it likely that his condition does not have a genetic etiology. He was relieved to hear this.  With a negative genetic result for aortopathies and no family history of aneurysms, dissections or sudden death, his first-degree relatives, namely his children and siblings need not undergo cardiac screening for aortic aneurysms.   Nevertheless, I impressed upon him the importance of managing risk factors, such as HTN, atherosclerosis, smoking that can weaken the aortic wall. He is cognizant of this and is working with his PCP to manage his hyperlipidemia. He also mentions modulating his exercise regimen and ensuring that he continues getting screened on a regular basis for his aortic aneurysm. His next screen is scheduled for March 2024. Informed him that I will have my notes sent to Dr. Cyndia Bent so his office can set up his screening schedule in the future.  Lattie Corns, Ph.D, [facility] Clinical Molecular Geneticist

## 2022-02-11 DIAGNOSIS — E785 Hyperlipidemia, unspecified: Secondary | ICD-10-CM | POA: Diagnosis not present

## 2022-02-11 DIAGNOSIS — Z125 Encounter for screening for malignant neoplasm of prostate: Secondary | ICD-10-CM | POA: Diagnosis not present

## 2022-02-11 DIAGNOSIS — R7989 Other specified abnormal findings of blood chemistry: Secondary | ICD-10-CM | POA: Diagnosis not present

## 2022-02-18 DIAGNOSIS — D72819 Decreased white blood cell count, unspecified: Secondary | ICD-10-CM | POA: Diagnosis not present

## 2022-02-18 DIAGNOSIS — Z1339 Encounter for screening examination for other mental health and behavioral disorders: Secondary | ICD-10-CM | POA: Diagnosis not present

## 2022-02-18 DIAGNOSIS — Z1331 Encounter for screening for depression: Secondary | ICD-10-CM | POA: Diagnosis not present

## 2022-02-18 DIAGNOSIS — I7121 Aneurysm of the ascending aorta, without rupture: Secondary | ICD-10-CM | POA: Diagnosis not present

## 2022-02-18 DIAGNOSIS — Z0000 Encounter for general adult medical examination without abnormal findings: Secondary | ICD-10-CM | POA: Diagnosis not present

## 2022-02-18 DIAGNOSIS — I700 Atherosclerosis of aorta: Secondary | ICD-10-CM | POA: Diagnosis not present

## 2022-02-18 DIAGNOSIS — E785 Hyperlipidemia, unspecified: Secondary | ICD-10-CM | POA: Diagnosis not present

## 2022-02-18 DIAGNOSIS — D447 Neoplasm of uncertain behavior of aortic body and other paraganglia: Secondary | ICD-10-CM | POA: Diagnosis not present

## 2022-02-28 DIAGNOSIS — D447 Neoplasm of uncertain behavior of aortic body and other paraganglia: Secondary | ICD-10-CM | POA: Diagnosis not present

## 2022-02-28 DIAGNOSIS — R7303 Prediabetes: Secondary | ICD-10-CM | POA: Diagnosis not present

## 2022-02-28 DIAGNOSIS — E291 Testicular hypofunction: Secondary | ICD-10-CM | POA: Diagnosis not present

## 2022-03-07 ENCOUNTER — Inpatient Hospital Stay (HOSPITAL_BASED_OUTPATIENT_CLINIC_OR_DEPARTMENT_OTHER): Payer: BC Managed Care – PPO | Admitting: Hematology & Oncology

## 2022-03-07 ENCOUNTER — Inpatient Hospital Stay: Payer: BC Managed Care – PPO | Attending: Hematology & Oncology

## 2022-03-07 ENCOUNTER — Encounter: Payer: Self-pay | Admitting: Hematology & Oncology

## 2022-03-07 VITALS — BP 133/83 | HR 74 | Temp 98.1°F | Resp 20 | Ht 72.0 in | Wt 206.4 lb

## 2022-03-07 DIAGNOSIS — Z79899 Other long term (current) drug therapy: Secondary | ICD-10-CM | POA: Diagnosis not present

## 2022-03-07 DIAGNOSIS — C6212 Malignant neoplasm of descended left testis: Secondary | ICD-10-CM

## 2022-03-07 DIAGNOSIS — Z923 Personal history of irradiation: Secondary | ICD-10-CM | POA: Diagnosis not present

## 2022-03-07 DIAGNOSIS — Z8547 Personal history of malignant neoplasm of testis: Secondary | ICD-10-CM | POA: Insufficient documentation

## 2022-03-07 DIAGNOSIS — I7121 Aneurysm of the ascending aorta, without rupture: Secondary | ICD-10-CM | POA: Insufficient documentation

## 2022-03-07 DIAGNOSIS — G629 Polyneuropathy, unspecified: Secondary | ICD-10-CM | POA: Diagnosis not present

## 2022-03-07 DIAGNOSIS — Z9221 Personal history of antineoplastic chemotherapy: Secondary | ICD-10-CM | POA: Diagnosis not present

## 2022-03-07 DIAGNOSIS — C6292 Malignant neoplasm of left testis, unspecified whether descended or undescended: Secondary | ICD-10-CM

## 2022-03-07 LAB — CBC WITH DIFFERENTIAL (CANCER CENTER ONLY)
Abs Immature Granulocytes: 0.01 10*3/uL (ref 0.00–0.07)
Basophils Absolute: 0 10*3/uL (ref 0.0–0.1)
Basophils Relative: 1 %
Eosinophils Absolute: 0 10*3/uL (ref 0.0–0.5)
Eosinophils Relative: 1 %
HCT: 43.1 % (ref 39.0–52.0)
Hemoglobin: 15.1 g/dL (ref 13.0–17.0)
Immature Granulocytes: 0 %
Lymphocytes Relative: 40 %
Lymphs Abs: 1.3 10*3/uL (ref 0.7–4.0)
MCH: 31.7 pg (ref 26.0–34.0)
MCHC: 35 g/dL (ref 30.0–36.0)
MCV: 90.4 fL (ref 80.0–100.0)
Monocytes Absolute: 0.4 10*3/uL (ref 0.1–1.0)
Monocytes Relative: 12 %
Neutro Abs: 1.5 10*3/uL — ABNORMAL LOW (ref 1.7–7.7)
Neutrophils Relative %: 46 %
Platelet Count: 149 10*3/uL — ABNORMAL LOW (ref 150–400)
RBC: 4.77 MIL/uL (ref 4.22–5.81)
RDW: 12.8 % (ref 11.5–15.5)
WBC Count: 3.3 10*3/uL — ABNORMAL LOW (ref 4.0–10.5)
nRBC: 0 % (ref 0.0–0.2)

## 2022-03-07 LAB — CMP (CANCER CENTER ONLY)
ALT: 19 U/L (ref 0–44)
AST: 21 U/L (ref 15–41)
Albumin: 4.8 g/dL (ref 3.5–5.0)
Alkaline Phosphatase: 35 U/L — ABNORMAL LOW (ref 38–126)
Anion gap: 9 (ref 5–15)
BUN: 20 mg/dL (ref 6–20)
CO2: 26 mmol/L (ref 22–32)
Calcium: 9.7 mg/dL (ref 8.9–10.3)
Chloride: 101 mmol/L (ref 98–111)
Creatinine: 0.95 mg/dL (ref 0.61–1.24)
GFR, Estimated: >60 mL/min (ref >60)
Glucose, Bld: 109 mg/dL — ABNORMAL HIGH (ref 70–99)
Potassium: 4.3 mmol/L (ref 3.5–5.1)
Sodium: 136 mmol/L (ref 135–145)
Total Bilirubin: 0.5 mg/dL (ref 0.3–1.2)
Total Protein: 7.1 g/dL (ref 6.5–8.1)

## 2022-03-07 LAB — LACTATE DEHYDROGENASE: LDH: 121 U/L (ref 98–192)

## 2022-03-07 NOTE — Progress Notes (Signed)
Hematology and Oncology   Kevin Kuznetsov [id number] 1970/04/15 52 y.o. 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        Prior Therapy:  Status post right radical orchiectomy with the pathology showed a 2.2 cm seminoma in August 2009.   He is status post adjuvant radiation therapy to the pelvis with a total of 26 Gy in 16 fractions concluded in October 2009. He is status post radical orchiectomy on the left completed on Jun 10, 2019.  The final pathology showed a 2.5 cm pure seminoma that is organ confined. BEP chemotherapy started on April 23, 2020.  He completed 3 cycles of therapy on June 19, 2020.     Current therapy: Active surveillance.   Interim History: Kevin Hess is here for a follow-up.  This is his first visit to the office.  He was followed by the iconic Kevin Hess.  He is doing well.  He is incredibly busy.  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  He is followed by endocrinology for hypogonadism.  He is on supplemental testosterone.  He also has a thoracic aortic aneurysm.  He is followed by Dr. Cyndia Hess for this.  He has had no problems with cough or shortness of breath.  He has had no problems with COVID.  There is been no change in bowel or bladder habits.  He has had no nausea or vomiting.  He has had no bleeding.  There is been no rashes.  He has had no leg swelling.  He does have some neuropathy.  Does have some minor cognitive deficits.  After all this is from chemotherapy.  I would like to believe that this will improve.  He has had no fever.  Overall, I was his performance status is ECOG 0.   Medications: Reviewed without changes. Current Outpatient Medications  Medication  Sig Dispense Refill   finasteride (PROSCAR) 5 MG tablet 1.25 mg 3 (three) times daily.     fluticasone (FLONASE) 50 MCG/ACT nasal spray Place 2 sprays into both nostrils daily.     nutrition supplement, JUVEN, (JUVEN) PACK Prebiotic 1 pill orally once a day     saccharomyces boulardii (FLORASTOR) 250 MG capsule 1 capsule Orally once a day     testosterone cypionate (DEPOTESTOSTERONE CYPIONATE) 200 MG/ML injection Inject 60 mg into the muscle See admin instructions. Every 9 days     Vitamin D-Vitamin K 20-120 MCG/0.1ML LIQD 4000 IU / 800 micrograms Orally     rosuvastatin (CRESTOR) 10 MG tablet Take 10 mg by mouth daily.     No current facility-administered medications for this visit.     Allergies:  Allergies  Allergen Reactions   Penicillins     Rash age 52    Physical exam    Blood pressure 133/83, pulse 74, temperature 98.1 F (36.7 C), temperature source Oral, resp. rate 20, height 6' (1.829 m), weight 206 lb 6.4 oz (93.6 kg), SpO2 99 %.   General appearance: Comfortable appearing without any discomfort Head: Normocephalic without any trauma Oropharynx: Mucous membranes are moist and pink without any thrush or ulcers. Eyes: Pupils are equal and round reactive to light. Lymph nodes: No cervical, supraclavicular, inguinal or axillary lymphadenopathy.  Heart:regular rate and rhythm.  S1 and S2 without leg edema. Lung: Clear without any rhonchi or wheezes.  No dullness to percussion. Abdomin: Soft, nontender, nondistended with good bowel sounds.  No hepatosplenomegaly. Musculoskeletal: No joint deformity or effusion.  Full range of motion noted. Neurological: No deficits noted on motor, sensory and deep tendon reflex exam. Skin: No petechial rash or dryness.  Appeared moist.    Lab Results: Lab Results  Component Value Date   WBC 3.3 (L) 03/07/2022   HGB 15.1 03/07/2022   HCT 43.1 03/07/2022   MCV 90.4 03/07/2022   PLT 149 (L) 03/07/2022     Chemistry       Component Value Date/Time   NA 137 05/07/2021 1139   NA 140 10/09/2016 1428   K 4.8 05/07/2021 1139   K 4.3 10/09/2016 1428   CL 103 05/07/2021 1139   CL 104 03/02/2012 1043   CO2 30 05/07/2021 1139   CO2 27 10/09/2016 1428   BUN 20 05/07/2021 1139   BUN 10.3 10/09/2016 1428   CREATININE 0.93 05/07/2021 1139   CREATININE 0.8 10/09/2016 1428      Component Value Date/Time   CALCIUM 10.0 05/07/2021 1139   CALCIUM 10.2 10/09/2016 1428   ALKPHOS 36 (L) 05/07/2021 1139   ALKPHOS 55 10/09/2016 1428   AST 15 05/07/2021 1139   AST 19 10/09/2016 1428   ALT 16 05/07/2021 1139   ALT 17 10/09/2016 1428   BILITOT 0.5 05/07/2021 1139   BILITOT 0.42 10/09/2016 1428       IMPRESSION: 1. Stable examination without new or progressive findings to suggest active metastatic disease in the chest, abdomen or pelvis. 2. Ascending thoracic aortic aneurysm measures 4.3 cm, similar to prior examination. Attention on follow-up oncologic studies recommend versus annual imaging by CTA or MRA. 3.  Aortic Atherosclerosis (ICD10-I70.0).   Impression and Plan:   Kevin Hess is a very nice 52 year old white male.  He has a very interesting history.  He has a history of a stage I testicular cancer back in 2009.  This was a seminoma.  He then had a left testicular cancer back in 2021.  He required chemotherapy for this.  He had adjuvant radiation therapy for the first cancer back in 2009.  He had 3 cycles of BEP chemotherapy for the second nonseminomatous germ cell tumor..  This was completed in June 2022.  We saw to follow him along.  He does need to have scans done.  I will have to set him up with his scans.  I would like to do scans the same day that I see him.  Again, he is followed by other doctors.  I am sure they will keep close tabs on his other issues.  He does have this genetic mutation that certainly increases the risk for vascular disease.  I would like to see him back probably in March  when he has his scans done.  After that, we will then plan to see him back every 6 months.    Volanda Napoleon, MD 03/07/2022 8:05 AM

## 2022-03-08 LAB — BETA HCG QUANT (REF LAB): hCG Quant: <1 m[IU]/mL (ref 0–3)

## 2022-03-08 LAB — AFP TUMOR MARKER: AFP, Serum, Tumor Marker: 2.2 ng/mL (ref 0.0–8.4)

## 2022-03-12 ENCOUNTER — Encounter: Payer: Self-pay | Admitting: Hematology & Oncology

## 2022-03-14 ENCOUNTER — Encounter: Payer: Self-pay | Admitting: Genetic Counselor

## 2022-03-24 ENCOUNTER — Encounter (HOSPITAL_BASED_OUTPATIENT_CLINIC_OR_DEPARTMENT_OTHER): Payer: Self-pay

## 2022-03-24 ENCOUNTER — Ambulatory Visit (HOSPITAL_BASED_OUTPATIENT_CLINIC_OR_DEPARTMENT_OTHER)
Admission: RE | Admit: 2022-03-24 | Discharge: 2022-03-24 | Disposition: A | Discharge status: Home / Self Care | Payer: BC Managed Care – PPO | Source: Ambulatory Visit | Attending: Hematology & Oncology | Admitting: Hematology & Oncology

## 2022-03-24 DIAGNOSIS — C6212 Malignant neoplasm of descended left testis: Secondary | ICD-10-CM | POA: Diagnosis not present

## 2022-03-24 DIAGNOSIS — N3289 Other specified disorders of bladder: Secondary | ICD-10-CM | POA: Diagnosis not present

## 2022-03-24 DIAGNOSIS — I700 Atherosclerosis of aorta: Secondary | ICD-10-CM | POA: Diagnosis not present

## 2022-03-24 MED ORDER — IOHEXOL 300 MG/ML  SOLN
100.0000 mL | Freq: Once | INTRAMUSCULAR | Status: AC | PRN
  Administered 2022-03-24: 100 mL via INTRAVENOUS

## 2022-04-04 ENCOUNTER — Encounter: Payer: Self-pay | Admitting: Hematology & Oncology

## 2022-04-09 ENCOUNTER — Ambulatory Visit: Payer: BC Managed Care – PPO | Admitting: Oncology

## 2022-04-11 ENCOUNTER — Inpatient Hospital Stay: Payer: BC Managed Care – PPO

## 2022-04-11 ENCOUNTER — Ambulatory Visit: Payer: BC Managed Care – PPO | Admitting: Hematology & Oncology

## 2022-04-15 DIAGNOSIS — M67911 Unspecified disorder of synovium and tendon, right shoulder: Secondary | ICD-10-CM | POA: Diagnosis not present

## 2022-04-21 DIAGNOSIS — M25811 Other specified joint disorders, right shoulder: Secondary | ICD-10-CM | POA: Diagnosis not present

## 2022-04-22 DIAGNOSIS — E291 Testicular hypofunction: Secondary | ICD-10-CM | POA: Diagnosis not present

## 2022-04-28 IMAGING — CT CT ABD-PELV W/ CM
2 of 5 series · 13 of 36 positions shown, 16 images · IV contrast (OMNIPAQUE)
Comparison: 05/23/2019

CLINICAL DATA: Testicular cancer.  Evaluate response to therapy.

EXAM:
CT CHEST, ABDOMEN, AND PELVIS WITH CONTRAST
TECHNIQUE: Multidetector CT imaging of the chest, abdomen and pelvis was
performed following the standard protocol during bolus
administration of intravenous contrast.
CONTRAST:  100mL OMNIPAQUE IOHEXOL 300 MG/ML  SOLN

[Series 2: cap with · axial · 0.87mm/px · z∈[-677,-92]mm · 10 of 145 slices shown, 13 images]
[im 14/145  mediastinal]
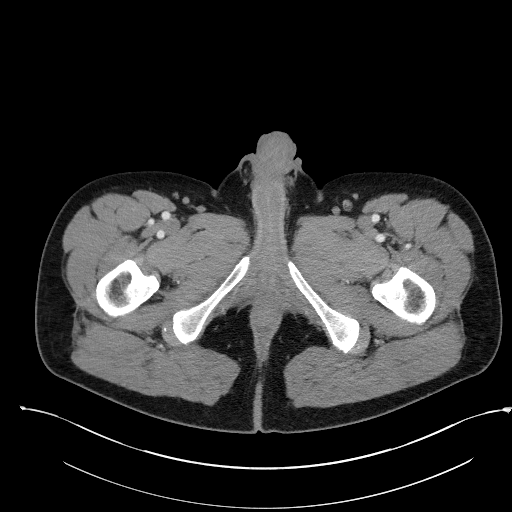
[im 14/145  lung]
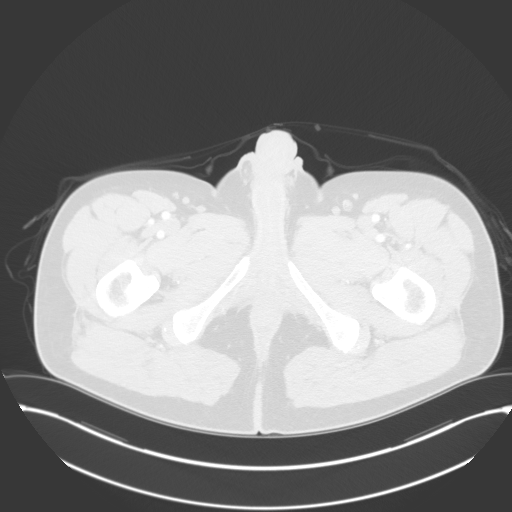
[im 27/145  lung]
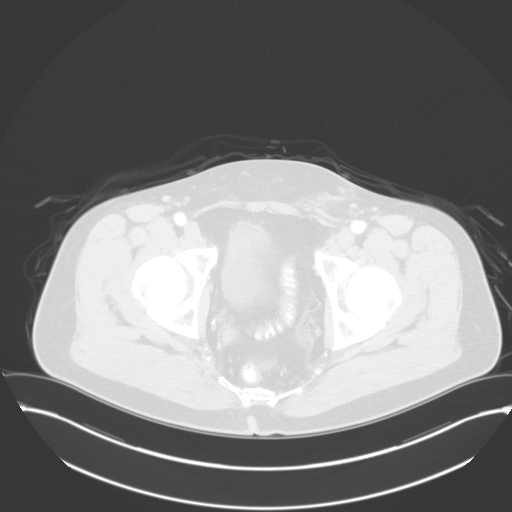
[im 40/145  lung]
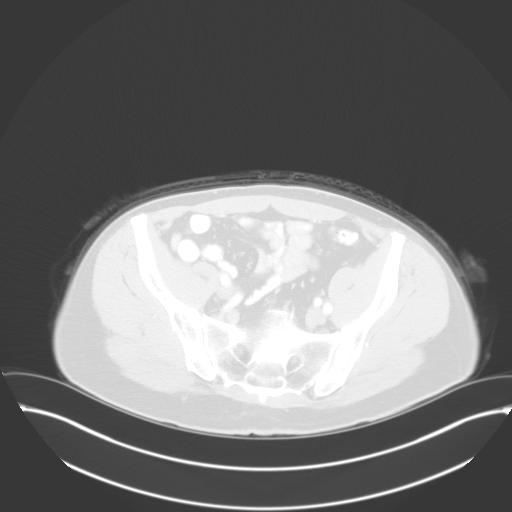
[im 53/145  lung]
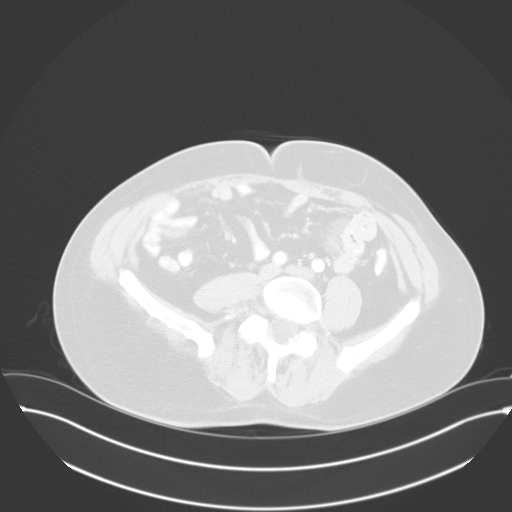
[im 66/145  mediastinal]
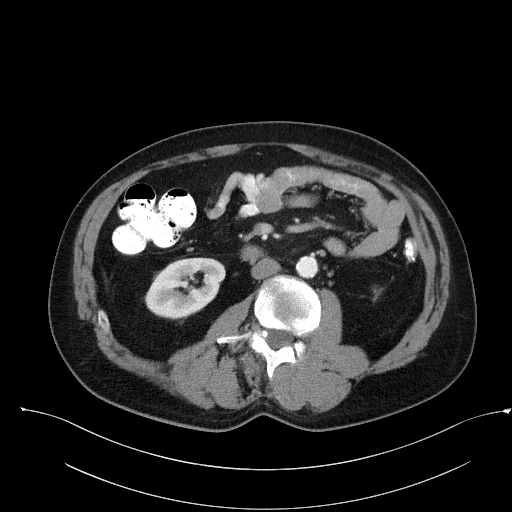
[im 66/145  lung]
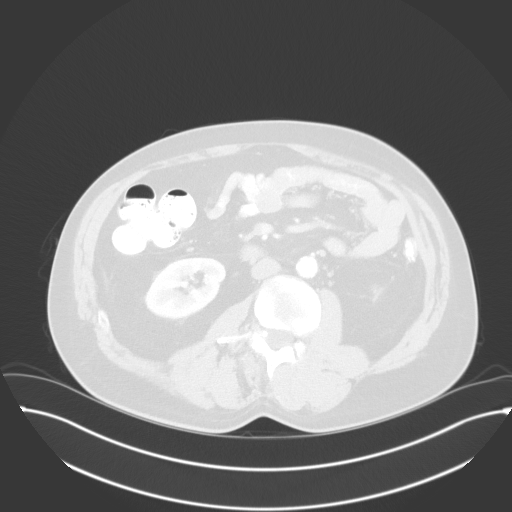
[im 79/145  lung]
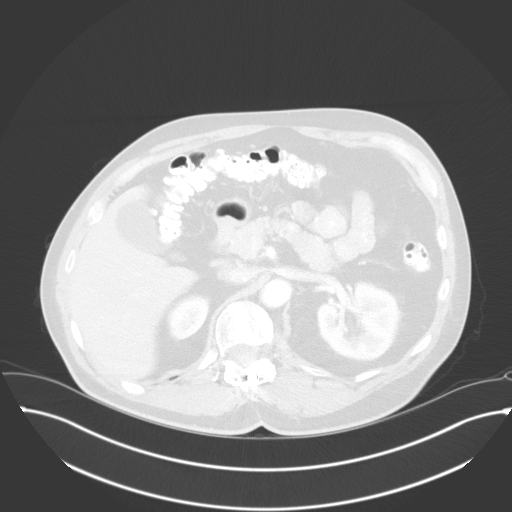
[im 92/145  lung]
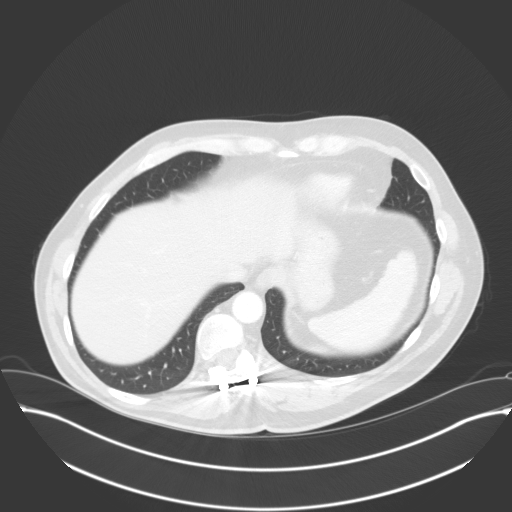
[im 105/145  lung]
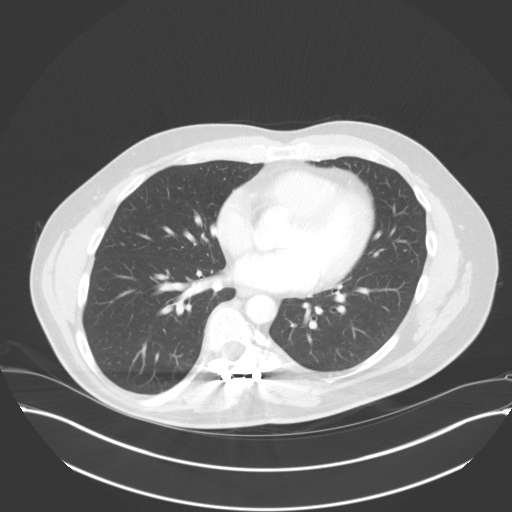
[im 118/145  mediastinal]
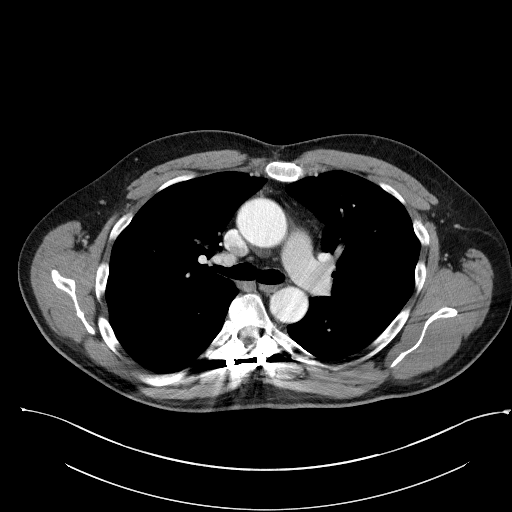
[im 118/145  lung]
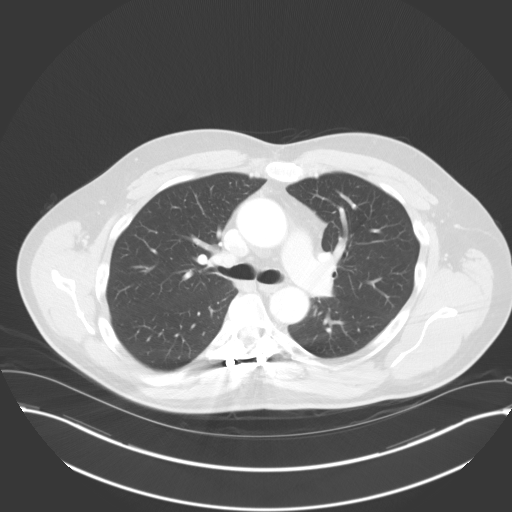
[im 131/145  lung]
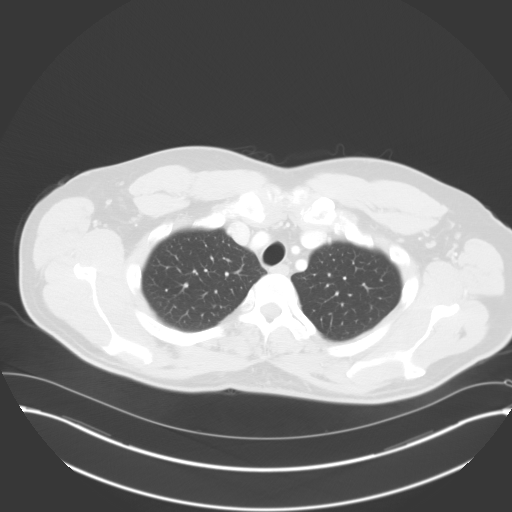

[Series 4: coronals · coronal · 0.87mm/px · 3 of 160 slices shown]
[im 32/160  lung]
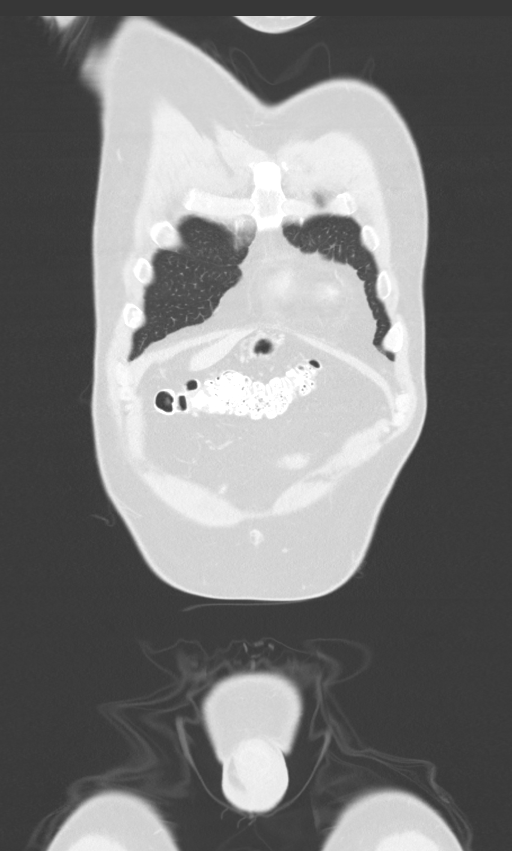
[im 64/160  lung]
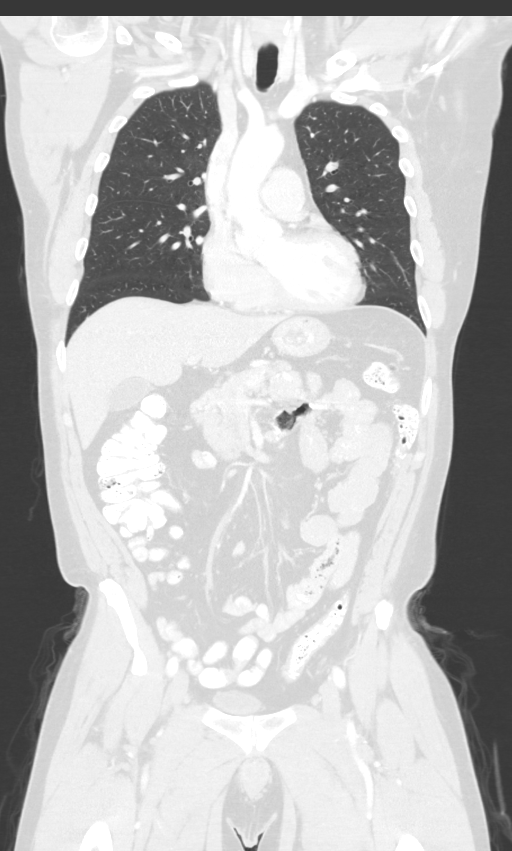
[im 96/160  lung]
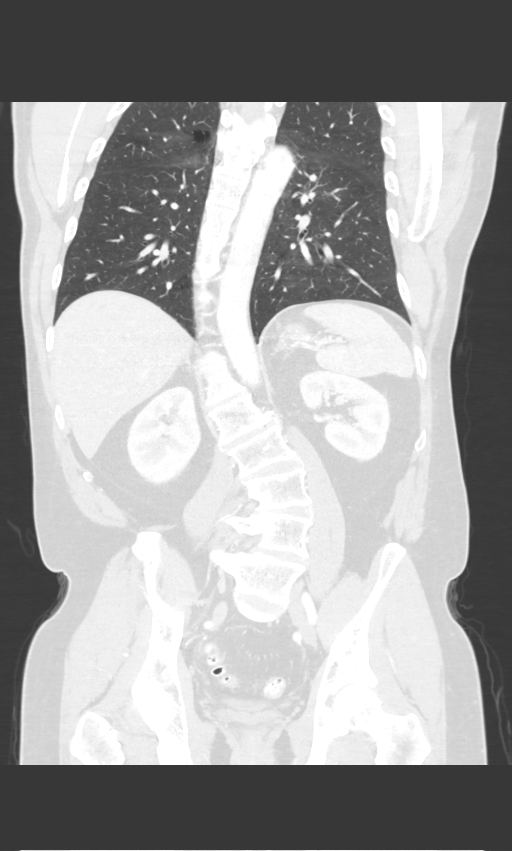

[13 of 36 positions shown; findings below may reference images not displayed]

FINDINGS: CT CHEST FINDINGS

Cardiovascular: Normal heart size. No pericardial effusion
identified. Stable ascending thoracic aortic aneurysm measuring
cm, image 32/2.

Mediastinum/Nodes: No enlarged mediastinal, hilar, or axillary lymph
nodes. Thyroid gland, trachea, and esophagus demonstrate no
significant findings.

Lungs/Pleura: No pleural effusion identified. No airspace
consolidation, atelectasis or pneumothorax. There is no suspicious
pulmonary nodule or mass identified.

Musculoskeletal: Thoracolumbar scoliosis deformity is noted status
post rod fixation. No acute or suspicious osseous findings.

CT ABDOMEN PELVIS FINDINGS

Hepatobiliary: Scattered low-attenuation foci within the liver are
again noted and appear unchanged. Index low-density structure in
posterior right hepatic lobe measures 6 mm and is stable. Also
unchanged is a 3 mm low-density structure near dome of liver and a 7
mm structure within the posterolateral right, image 58/2. No new
focal liver. Gallbladder normal. No bile duct dilatation.

Pancreas: Unremarkable. No pancreatic ductal dilatation or
surrounding inflammatory changes.

Spleen: Normal in size without focal abnormality.

Adrenals/Urinary Tract: Unchanged 9 mm left adrenal nodule. Right
adrenal gland is normal. No suspicious enhancing kidney mass or
hydronephrosis. Small low-density structure within lower pole of
right kidney is unchanged measuring 6 mm, image 85/2. This remains
too small to reliably characterize. Bladder unremarkable.

Stomach/Bowel: Stomach and small bowel loops appear normal. The
appendix is visualized and is unremarkable. Normal appearance of the
colon.

Vascular/Lymphatic: Mild to moderate aortic atherosclerosis without
aneurysm. No abdominopelvic adenopathy.

Reproductive: Prostate is unremarkable.

Other: No free fluid or fluid collections.

Musculoskeletal: Scoliosis deformity is again noted involving the
thoracolumbar spine. No acute or suspicious osseous findings.
IMPRESSION: 1. Stable exam.  No significant change compared with 05/23/2019.
2. Stable left adrenal nodule measuring 9 mm.
3. Unchanged appearance of subcentimeter, too small to characterize
low-density structures within the liver.
4.  Aortic Atherosclerosis ([id number]-[id number]).
5. Stable ascending thoracic aortic aneurysm. Recommend annual
imaging followup by CTA or MRA. This recommendation follows 5828
ACCF/AHA/AATS/ACR/ASA/SCA/ALFF/KALMONI/MOATSHE/YOSHIDA Guidelines for the
Diagnosis and Management of Patients with Thoracic Aortic Disease.
Circulation. 5828; 121: E266-e369. Aortic aneurysm NOS ([id number].8)

## 2022-05-01 DIAGNOSIS — M25811 Other specified joint disorders, right shoulder: Secondary | ICD-10-CM | POA: Diagnosis not present

## 2022-05-06 DIAGNOSIS — M25811 Other specified joint disorders, right shoulder: Secondary | ICD-10-CM | POA: Diagnosis not present

## 2022-05-21 DIAGNOSIS — M25811 Other specified joint disorders, right shoulder: Secondary | ICD-10-CM | POA: Diagnosis not present

## 2022-05-27 DIAGNOSIS — M25561 Pain in right knee: Secondary | ICD-10-CM | POA: Diagnosis not present

## 2022-05-27 DIAGNOSIS — M25461 Effusion, right knee: Secondary | ICD-10-CM | POA: Diagnosis not present

## 2022-06-02 DIAGNOSIS — M25811 Other specified joint disorders, right shoulder: Secondary | ICD-10-CM | POA: Diagnosis not present

## 2022-06-10 DIAGNOSIS — M25811 Other specified joint disorders, right shoulder: Secondary | ICD-10-CM | POA: Diagnosis not present

## 2022-07-08 DIAGNOSIS — M25811 Other specified joint disorders, right shoulder: Secondary | ICD-10-CM | POA: Diagnosis not present

## 2022-07-16 DIAGNOSIS — R635 Abnormal weight gain: Secondary | ICD-10-CM | POA: Diagnosis not present

## 2022-07-29 DIAGNOSIS — Z13220 Encounter for screening for lipoid disorders: Secondary | ICD-10-CM | POA: Diagnosis not present

## 2022-07-29 DIAGNOSIS — E663 Overweight: Secondary | ICD-10-CM | POA: Diagnosis not present

## 2022-07-29 DIAGNOSIS — I77810 Thoracic aortic ectasia: Secondary | ICD-10-CM | POA: Diagnosis not present

## 2022-07-29 DIAGNOSIS — E291 Testicular hypofunction: Secondary | ICD-10-CM | POA: Diagnosis not present

## 2022-07-31 DIAGNOSIS — E663 Overweight: Secondary | ICD-10-CM | POA: Diagnosis not present

## 2022-07-31 DIAGNOSIS — Z13220 Encounter for screening for lipoid disorders: Secondary | ICD-10-CM | POA: Diagnosis not present

## 2022-08-06 DIAGNOSIS — R7303 Prediabetes: Secondary | ICD-10-CM | POA: Diagnosis not present

## 2022-08-06 DIAGNOSIS — M25511 Pain in right shoulder: Secondary | ICD-10-CM | POA: Diagnosis not present

## 2022-08-06 DIAGNOSIS — E663 Overweight: Secondary | ICD-10-CM | POA: Diagnosis not present

## 2022-08-06 DIAGNOSIS — I2510 Atherosclerotic heart disease of native coronary artery without angina pectoris: Secondary | ICD-10-CM | POA: Diagnosis not present

## 2022-08-06 DIAGNOSIS — M19011 Primary osteoarthritis, right shoulder: Secondary | ICD-10-CM | POA: Diagnosis not present

## 2022-08-06 DIAGNOSIS — R931 Abnormal findings on diagnostic imaging of heart and coronary circulation: Secondary | ICD-10-CM | POA: Diagnosis not present

## 2022-08-06 DIAGNOSIS — Z136 Encounter for screening for cardiovascular disorders: Secondary | ICD-10-CM | POA: Diagnosis not present

## 2022-08-18 DIAGNOSIS — M25811 Other specified joint disorders, right shoulder: Secondary | ICD-10-CM | POA: Diagnosis not present

## 2022-08-18 DIAGNOSIS — M19011 Primary osteoarthritis, right shoulder: Secondary | ICD-10-CM | POA: Diagnosis not present

## 2022-08-18 DIAGNOSIS — E291 Testicular hypofunction: Secondary | ICD-10-CM | POA: Diagnosis not present

## 2022-08-25 DIAGNOSIS — M25811 Other specified joint disorders, right shoulder: Secondary | ICD-10-CM | POA: Diagnosis not present

## 2022-08-30 DIAGNOSIS — Z8547 Personal history of malignant neoplasm of testis: Secondary | ICD-10-CM | POA: Diagnosis not present

## 2022-08-30 DIAGNOSIS — I77810 Thoracic aortic ectasia: Secondary | ICD-10-CM | POA: Diagnosis not present

## 2022-08-30 DIAGNOSIS — Z1509 Genetic susceptibility to other malignant neoplasm: Secondary | ICD-10-CM | POA: Diagnosis not present

## 2022-09-01 DIAGNOSIS — M25811 Other specified joint disorders, right shoulder: Secondary | ICD-10-CM | POA: Diagnosis not present

## 2022-09-04 ENCOUNTER — Inpatient Hospital Stay: Payer: BC Managed Care – PPO | Admitting: Hematology & Oncology

## 2022-09-04 ENCOUNTER — Inpatient Hospital Stay: Payer: BC Managed Care – PPO

## 2022-09-08 DIAGNOSIS — M25811 Other specified joint disorders, right shoulder: Secondary | ICD-10-CM | POA: Diagnosis not present

## 2022-09-10 DIAGNOSIS — H61002 Unspecified perichondritis of left external ear: Secondary | ICD-10-CM | POA: Diagnosis not present

## 2022-09-10 DIAGNOSIS — I781 Nevus, non-neoplastic: Secondary | ICD-10-CM | POA: Diagnosis not present

## 2022-09-16 DIAGNOSIS — M25811 Other specified joint disorders, right shoulder: Secondary | ICD-10-CM | POA: Diagnosis not present

## 2022-09-22 DIAGNOSIS — M25811 Other specified joint disorders, right shoulder: Secondary | ICD-10-CM | POA: Diagnosis not present

## 2022-09-26 DIAGNOSIS — M419 Scoliosis, unspecified: Secondary | ICD-10-CM | POA: Diagnosis not present

## 2022-09-26 DIAGNOSIS — M25561 Pain in right knee: Secondary | ICD-10-CM | POA: Diagnosis not present

## 2022-10-08 DIAGNOSIS — M25811 Other specified joint disorders, right shoulder: Secondary | ICD-10-CM | POA: Diagnosis not present

## 2022-10-14 DIAGNOSIS — M6281 Muscle weakness (generalized): Secondary | ICD-10-CM | POA: Diagnosis not present

## 2022-10-14 DIAGNOSIS — M25561 Pain in right knee: Secondary | ICD-10-CM | POA: Diagnosis not present

## 2022-10-16 DIAGNOSIS — E291 Testicular hypofunction: Secondary | ICD-10-CM | POA: Diagnosis not present

## 2022-10-16 DIAGNOSIS — Z1589 Genetic susceptibility to other disease: Secondary | ICD-10-CM | POA: Diagnosis not present

## 2022-10-16 DIAGNOSIS — G8929 Other chronic pain: Secondary | ICD-10-CM | POA: Diagnosis not present

## 2022-10-16 DIAGNOSIS — G629 Polyneuropathy, unspecified: Secondary | ICD-10-CM | POA: Diagnosis not present

## 2022-10-21 DIAGNOSIS — Z1589 Genetic susceptibility to other disease: Secondary | ICD-10-CM | POA: Diagnosis not present

## 2022-10-28 DIAGNOSIS — M25561 Pain in right knee: Secondary | ICD-10-CM | POA: Diagnosis not present

## 2022-10-28 DIAGNOSIS — M6281 Muscle weakness (generalized): Secondary | ICD-10-CM | POA: Diagnosis not present

## 2022-10-28 DIAGNOSIS — M25811 Other specified joint disorders, right shoulder: Secondary | ICD-10-CM | POA: Diagnosis not present

## 2022-10-29 DIAGNOSIS — Z8547 Personal history of malignant neoplasm of testis: Secondary | ICD-10-CM | POA: Diagnosis not present

## 2022-10-30 DIAGNOSIS — Z1589 Genetic susceptibility to other disease: Secondary | ICD-10-CM | POA: Diagnosis not present

## 2022-11-05 DIAGNOSIS — M6281 Muscle weakness (generalized): Secondary | ICD-10-CM | POA: Diagnosis not present

## 2022-11-05 DIAGNOSIS — M25561 Pain in right knee: Secondary | ICD-10-CM | POA: Diagnosis not present

## 2022-11-07 DIAGNOSIS — E041 Nontoxic single thyroid nodule: Secondary | ICD-10-CM | POA: Diagnosis not present

## 2022-11-07 DIAGNOSIS — G629 Polyneuropathy, unspecified: Secondary | ICD-10-CM | POA: Diagnosis not present

## 2022-11-07 DIAGNOSIS — Z1589 Genetic susceptibility to other disease: Secondary | ICD-10-CM | POA: Diagnosis not present

## 2022-11-10 DIAGNOSIS — M6281 Muscle weakness (generalized): Secondary | ICD-10-CM | POA: Diagnosis not present

## 2022-11-10 DIAGNOSIS — M25811 Other specified joint disorders, right shoulder: Secondary | ICD-10-CM | POA: Diagnosis not present

## 2022-11-10 DIAGNOSIS — M25561 Pain in right knee: Secondary | ICD-10-CM | POA: Diagnosis not present

## 2022-11-18 DIAGNOSIS — M6281 Muscle weakness (generalized): Secondary | ICD-10-CM | POA: Diagnosis not present

## 2022-11-18 DIAGNOSIS — M25561 Pain in right knee: Secondary | ICD-10-CM | POA: Diagnosis not present

## 2022-11-25 DIAGNOSIS — M6281 Muscle weakness (generalized): Secondary | ICD-10-CM | POA: Diagnosis not present

## 2022-11-25 DIAGNOSIS — M25561 Pain in right knee: Secondary | ICD-10-CM | POA: Diagnosis not present

## 2022-11-26 DIAGNOSIS — M75101 Unspecified rotator cuff tear or rupture of right shoulder, not specified as traumatic: Secondary | ICD-10-CM | POA: Diagnosis not present

## 2022-11-26 DIAGNOSIS — M67921 Unspecified disorder of synovium and tendon, right upper arm: Secondary | ICD-10-CM | POA: Diagnosis not present

## 2022-11-28 IMAGING — CT CT CHEST-ABD-PELV W/ CM
3 of 5 series · 13 of 36 positions shown, 14 images · IV contrast (APPLIED)
Comparison: Multiple priors including CT chest abdomen pelvis
December 05, 2019

CLINICAL DATA: History of testicular cancer, restaging.

EXAM:
CT CHEST, ABDOMEN, AND PELVIS WITH CONTRAST
TECHNIQUE: Multidetector CT imaging of the chest, abdomen and pelvis was
performed following the standard protocol during bolus
administration of intravenous contrast.
CONTRAST:  100mL OMNIPAQUE IOHEXOL 300 MG/ML  SOLN

[Series 2: cap with 2 · axial · 0.74mm/px · z∈[-684,-134]mm · 7 of 148 slices shown, 8 images]
[im 19/148  mediastinal]
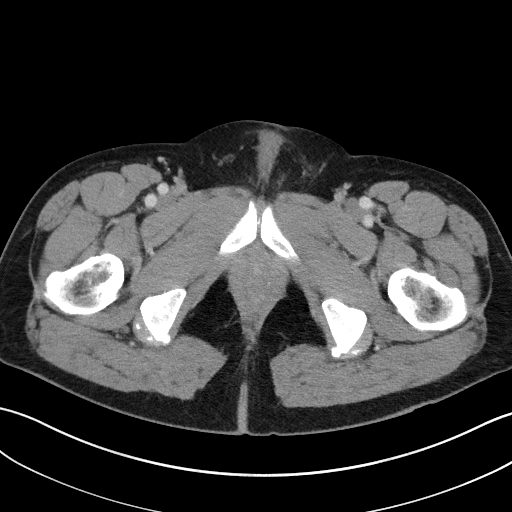
[im 19/148  bone]
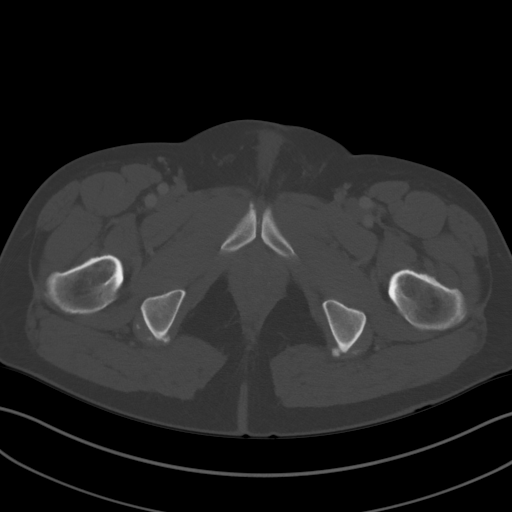
[im 37/148  mediastinal]
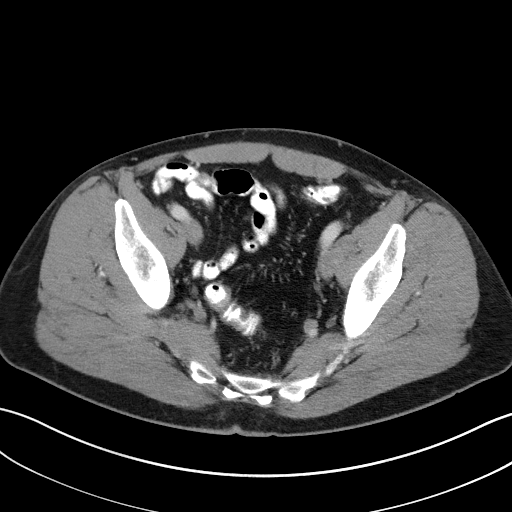
[im 56/148  mediastinal]
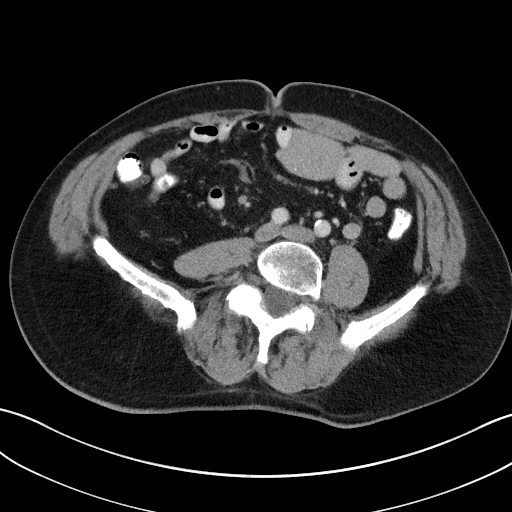
[im 74/148  mediastinal]
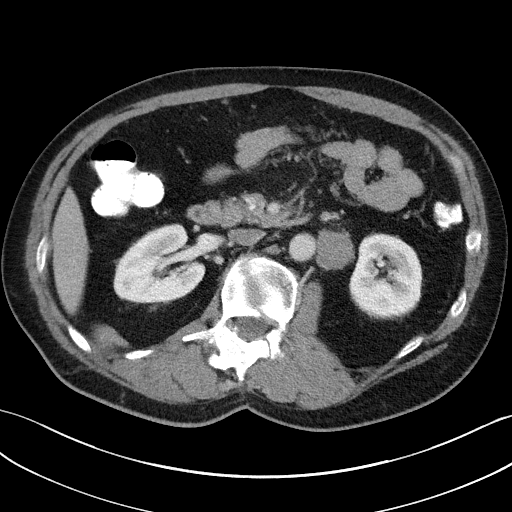
[im 92/148  mediastinal]
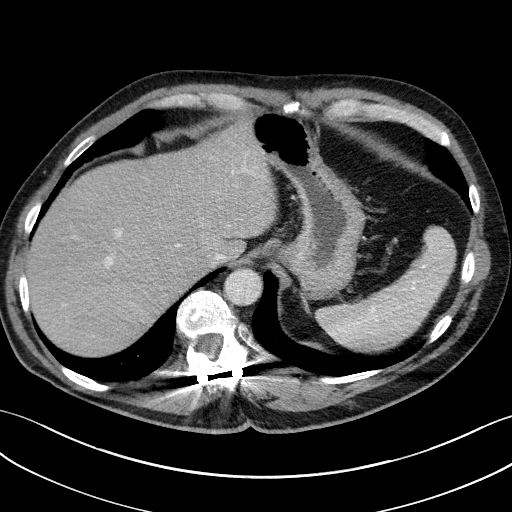
[im 111/148  mediastinal]
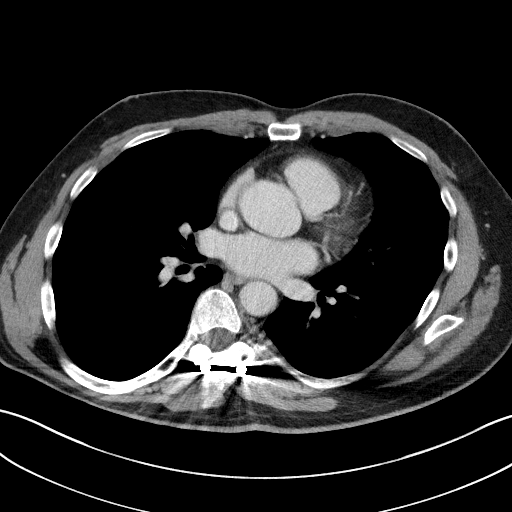
[im 129/148  mediastinal]
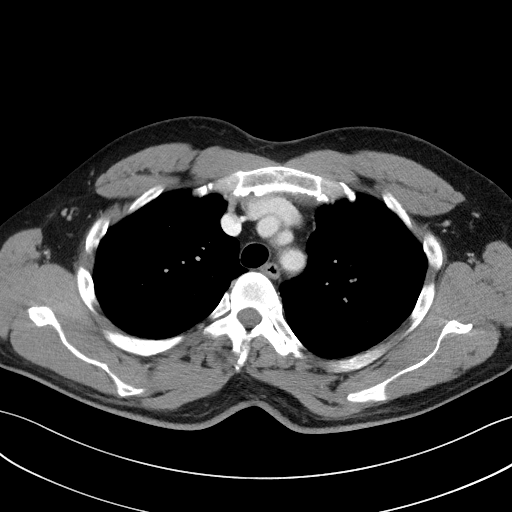

[Series 4: coronals · coronal · 0.78mm/px · 3 of 131 slices shown]
[im 27/131  mediastinal]
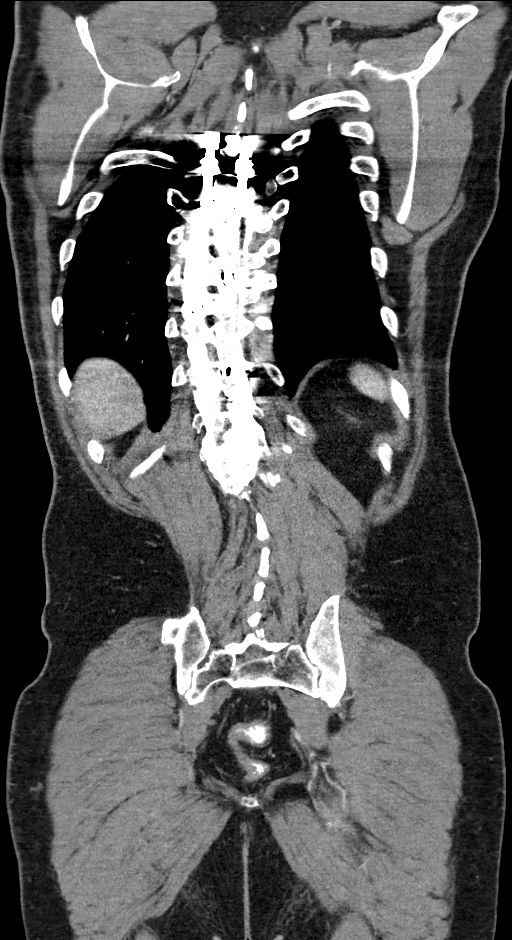
[im 53/131  mediastinal]
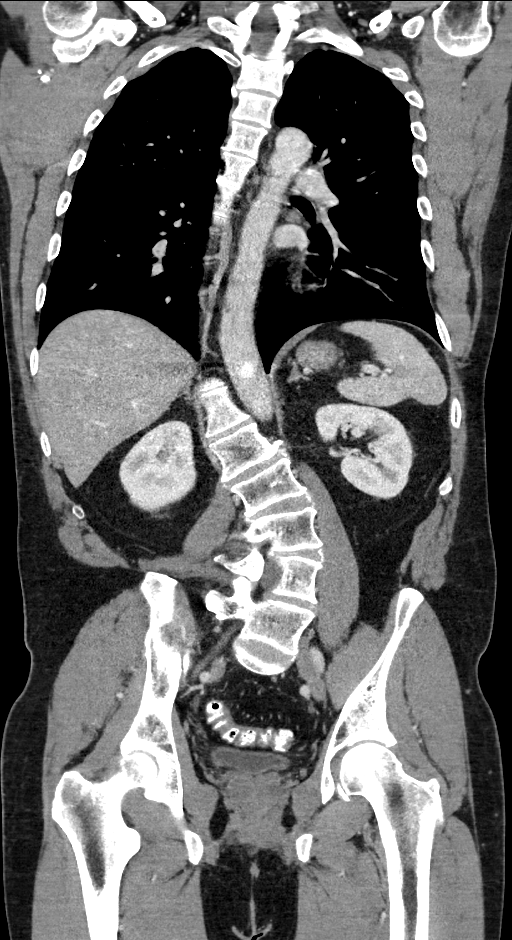
[im 79/131  mediastinal]
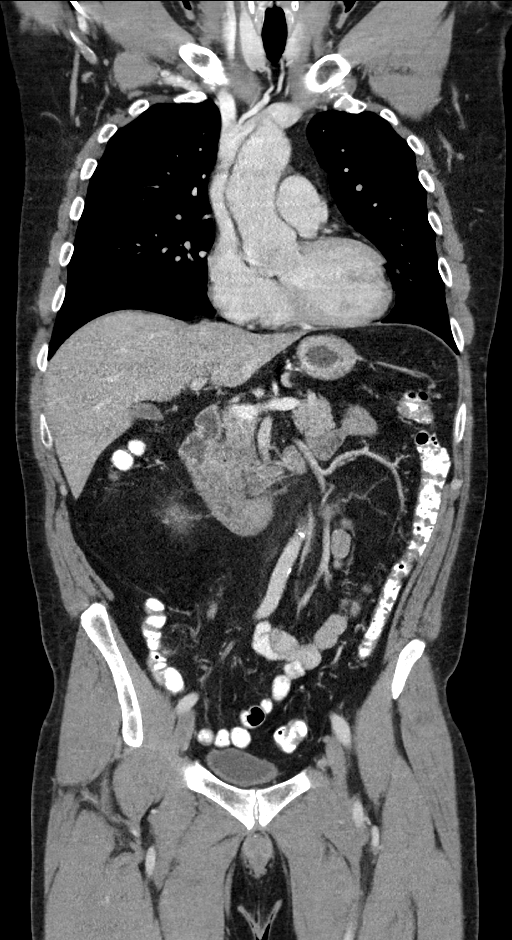

[Series 7: delay · axial · delayed · 0.74mm/px · z∈[-730,-566]mm · 3 of 99 slices shown]
[im 17/99  mediastinal]
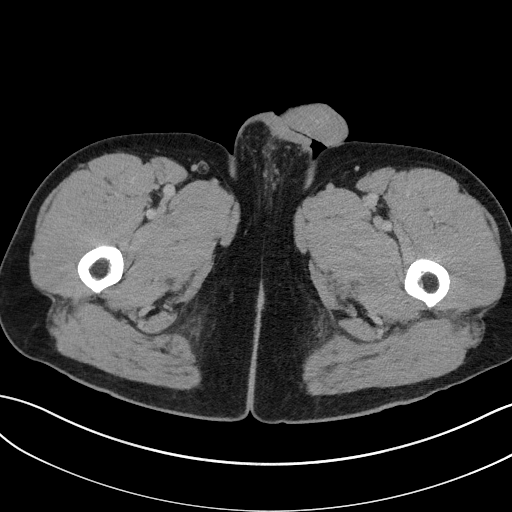
[im 33/99  mediastinal]
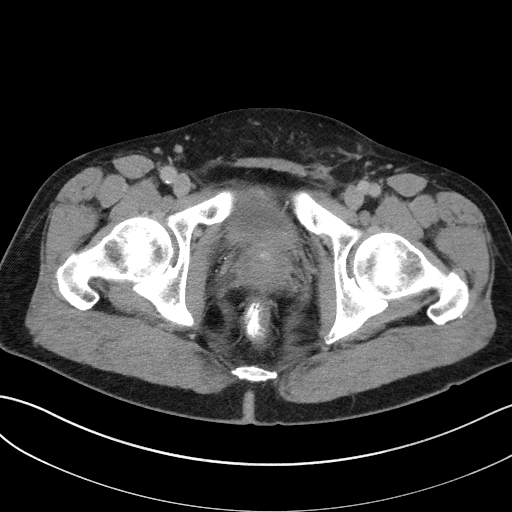
[im 50/99  mediastinal]
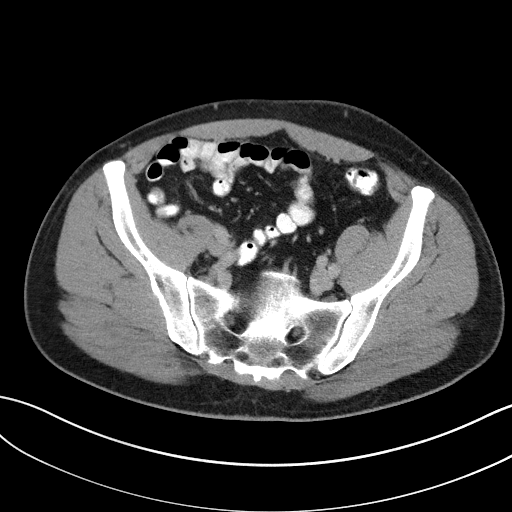

[13 of 36 positions shown; findings below may reference images not displayed]

FINDINGS: CT CHEST FINDINGS

Cardiovascular: Normal size heart. No significant pericardial
effusion/thickening. Unchanged size of the 4.3 cm ascending thoracic
aortic aneurysm. No central pulmonary embolus.

Mediastinum/Nodes: Thyroid is grossly unremarkable without discrete
nodularity. No pathologically enlarged mediastinal, hilar or
axillary lymph nodes. Trachea esophagus are grossly unremarkable.

Lungs/Pleura: Similar mild biapical pleuroparenchymal scarring. No
focal consolidation. No suspicious pulmonary nodules or masses. No
pleural effusion. No pneumothorax.

Musculoskeletal: Dextroscoliosis of the thoracic spine with
thoracolumbar fixation rods. No suspicious lytic or blastic lesions
of bone.

CT ABDOMEN PELVIS FINDINGS

Hepatobiliary: Similar size and appearance of the subcentimeter
hypodense hepatic lesions for instance the 7 mm lesion in the
posterior right lobe of the liver on image 66/2 and the 4 mm lesion
in the right hepatic dome on image 53/2, these are technically too
small to accurately characterize but favored to represent benign
hepatic cysts. Gallbladder is unremarkable. No biliary ductal
dilation.

Pancreas: Unremarkable. No pancreatic ductal dilatation or
surrounding inflammatory changes.

Spleen: Normal in size without focal abnormality.

Adrenals/Urinary Tract: Similar mild nodular thickening of the left
adrenal gland measuring up to 8 mm which is likely benign. Right
adrenal glands unremarkable. No hydronephrosis. Symmetric
enhancement excretion of contrast in the bilateral kidneys. No
suspicious filling defect visualized within the opacified portions
of the collecting system and ureters on delayed imaging. Urinary
bladder is grossly unremarkable for degree of distension.

Stomach/Bowel: Stomach is grossly unremarkable for degree of
distension. Normal positioning of the duodenum/ligament of Treitz.
No suspicious small bowel wall thickening or dilation. The appendix
and terminal ileum appear unremarkable. No suspicious colonic wall
thickening or mass like lesions. Descending and sigmoid colonic
diverticulosis without findings of acute diverticulitis.

Vascular/Lymphatic: Aortic atherosclerosis without aneurysmal
dilation of the abdominal aorta.

There are new pathologically enlarged left retroperitoneal lymph
nodes for instance a 2.8 x 2.8 cm left periaortic lymph node on
image 74/2 at the level of the renal hilum. Left periaortic lymph
node measuring 2 cm just inferior to the left kidney on image 81/2.
No pathologically enlarged gastrohepatic or periportal lymph nodes.
No pathologically enlarged pelvic lymph nodes.

Reproductive: Status post left orchiectomy. Dystrophic prostatic
calcifications otherwise the prostate is grossly unremarkable.

Other: No abdominopelvic ascites.

Musculoskeletal: Lumbar levoscoliosis. Multilevel degenerative
changes spine. No aggressive lytic or blastic lesions bone.
IMPRESSION: 1. New pathologically enlarged left retroperitoneal lymph nodes,
consistent with metastatic disease involvement.
2. Status post left orchiectomy. No evidence of metastatic disease
in the thorax.
3. Similar mild nodular thickening of the left adrenal gland
measuring up to 8 mm which is likely benign, continued attention on
follow-up imaging is recommended.
4. Unchanged size of the 4.3 cm ascending thoracic aortic aneurysm.
Recommend annual imaging followup by CTA or MRA. However, currently
continue surveillance on oncologic imaging is sufficient in this
patient. This recommendation follows 8313
ACCF/AHA/AATS/ACR/ASA/SCA/OZTAIZA/MUTALLIB/JOBISH/HAIDE Guidelines for the
Diagnosis and Management of Patients with Thoracic Aortic Disease.
Circulation. 8313; 121: [id number]. Aortic aneurysm NOS ([id number])
5. Descending and sigmoid colonic diverticulosis without findings of
acute diverticulitis.
6. Aortic atherosclerosis.  Aortic Atherosclerosis ([id number]).

These results will be called to the ordering clinician or
representative by the Radiologist Assistant, and communication
documented in the PACS or [REDACTED].

## 2022-12-01 DIAGNOSIS — I77810 Thoracic aortic ectasia: Secondary | ICD-10-CM | POA: Diagnosis not present

## 2022-12-01 DIAGNOSIS — I517 Cardiomegaly: Secondary | ICD-10-CM | POA: Diagnosis not present

## 2022-12-08 IMAGING — XA IR IMAGING GUIDED PORT INSERTION
1 series · 2 of 2 positions shown · non-contrast
Comparison: none

INDICATION: 49-year-old woman with metastatic testicular seminoma presents today
measure radiology for chest port placement.

[Series 1: care single · 2 of 2 slices shown]
[im 1/2]
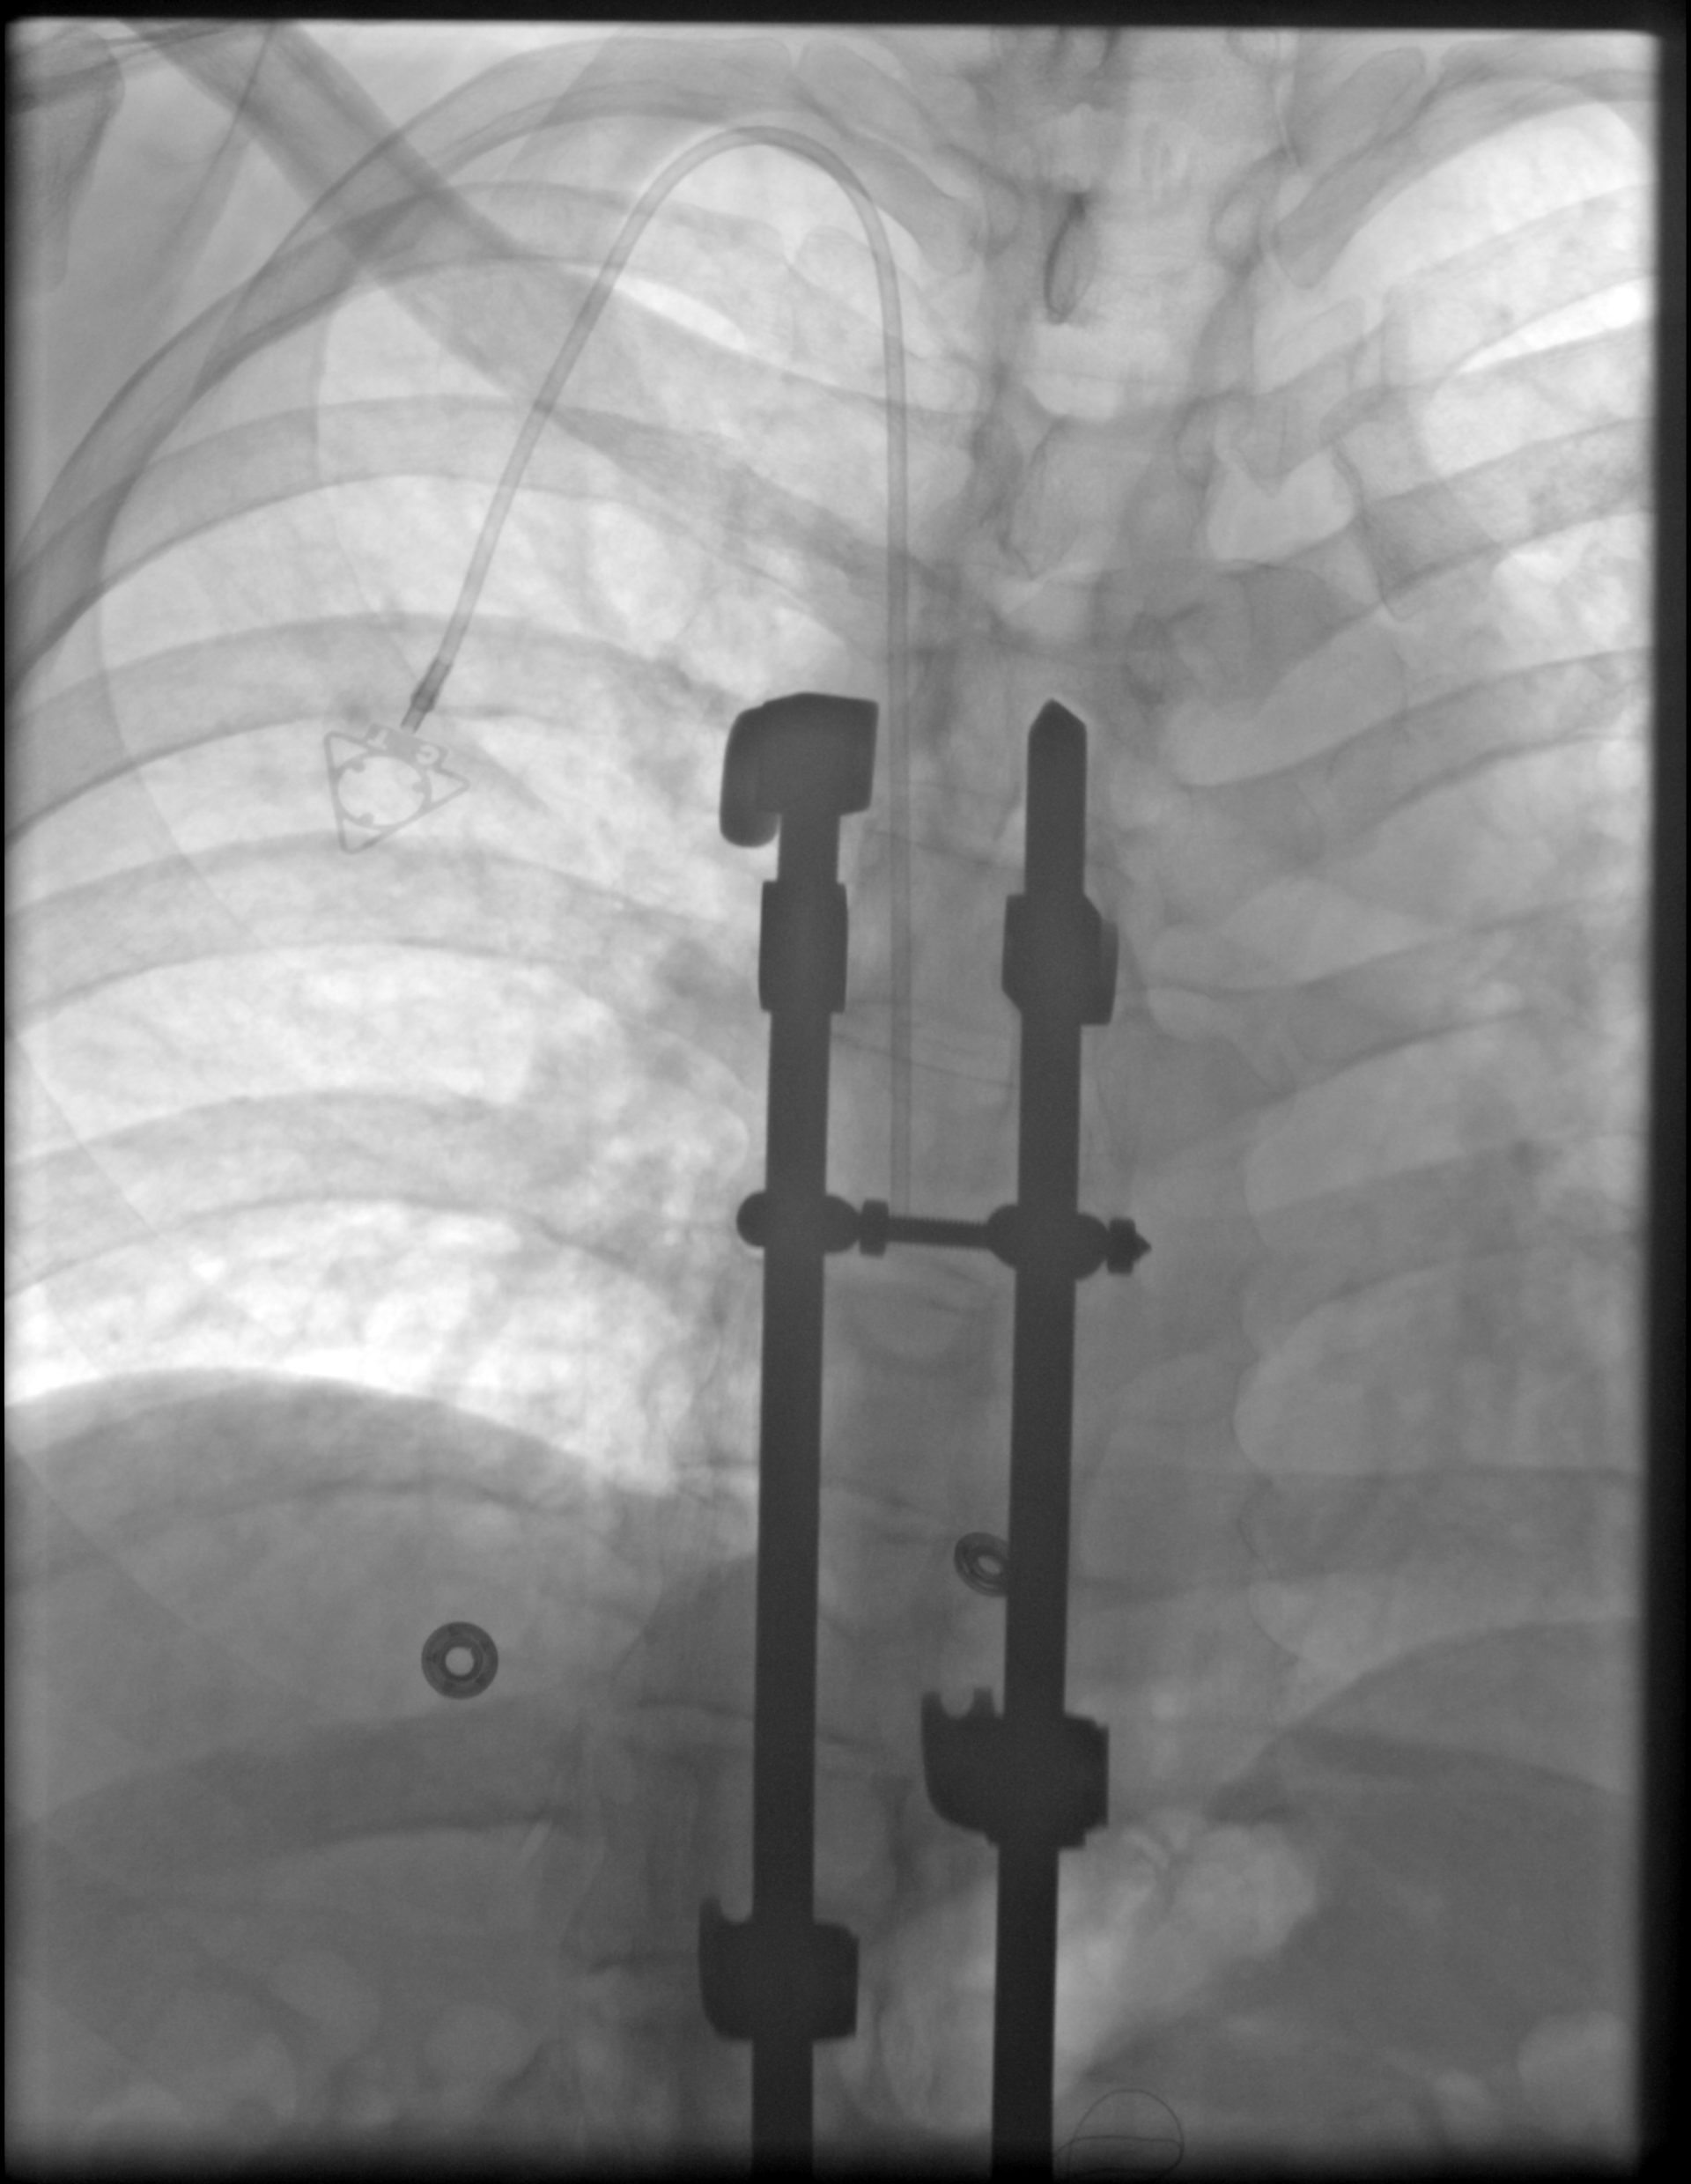
[im 2/2]
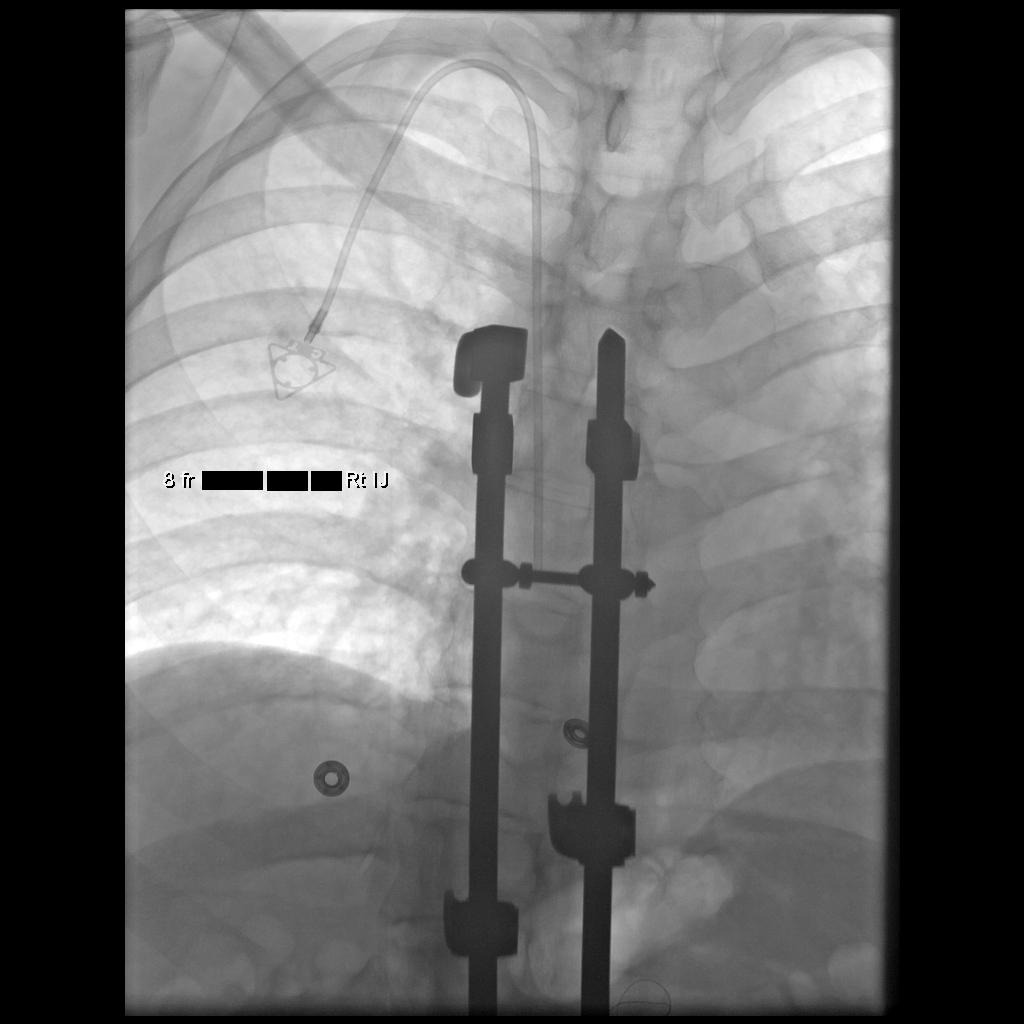

[2 of 2 positions shown; findings below may reference images not displayed]

EXAM:
IMPLANTED PORT A CATH PLACEMENT WITH ULTRASOUND AND FLUOROSCOPIC
GUIDANCE

MEDICATIONS:
None

ANESTHESIA/SEDATION:
Moderate (conscious) sedation was employed during this procedure. A
total of Versed 4 mg and Fentanyl 100 mcg was administered
intravenously.

Moderate Sedation Time: 23 minutes. The patient's level of
consciousness and vital signs were monitored continuously by
radiology nursing throughout the procedure under my direct
supervision.

FLUOROSCOPY TIME:  0 minutes, 36 seconds (8 mGy)

COMPLICATIONS:
None immediate.

PROCEDURE:
The procedure, risks, benefits, and alternatives were explained to
the patient. Questions regarding the procedure were encouraged and
answered. The patient understands and consents to the procedure.

A timeout was performed prior to the initiation of the procedure.

Patient positioned supine on the angiography table.

Right neck and anterior upper chest prepped and draped in the usual
sterile fashion. All elements of maximal sterile barrier were
utilized including, cap, mask, sterile gown, sterile gloves, large
sterile drape, hand scrubbing and 2% Chlorhexidine for skin
cleaning.

The right internal jugular vein was evaluated with ultrasound and
shown to be patent. A permanent ultrasound image was obtained and
placed in the patient's medical record. Local anesthesia was
provided with 1% lidocaine with epinephrine.

Using sterile gel and a sterile probe cover, the right internal
jugular vein was entered with a 21 ga needle during real time
ultrasound guidance.

0.018 inch guidewire placed and 21 ga needle exchanged for
transitional dilator set. Utilizing fluoroscopy, 0.035 inch
guidewire advanced through the needle without difficulty.

Attention then turned to the right anterior upper chest. Following
local lidocaine administration, a port pocket was created. The
catheter was connected to the port and brought from the pocket to
the venotomy site through a subcutaneous tunnel.

The catheter was cut to size and inserted through the peel-away
sheath. The catheter tip was positioned at the cavoatrial junction
using fluoroscopic guidance.

The port aspirated and flushed well. The port pocket was closed with
deep and superficial absorbable suture. The port pocket incision and
venotomy sites were also sealed with Dermabond.
IMPRESSION: Successful placement of a right internal jugular approach power
injectable Port-A-Cath. The catheter is ready for immediate use.

## 2022-12-13 DIAGNOSIS — S83231A Complex tear of medial meniscus, current injury, right knee, initial encounter: Secondary | ICD-10-CM | POA: Diagnosis not present

## 2022-12-23 DIAGNOSIS — S83231A Complex tear of medial meniscus, current injury, right knee, initial encounter: Secondary | ICD-10-CM | POA: Diagnosis not present

## 2022-12-23 DIAGNOSIS — M25561 Pain in right knee: Secondary | ICD-10-CM | POA: Diagnosis not present

## 2023-01-12 DIAGNOSIS — M94261 Chondromalacia, right knee: Secondary | ICD-10-CM | POA: Diagnosis not present

## 2023-01-12 DIAGNOSIS — S83241A Other tear of medial meniscus, current injury, right knee, initial encounter: Secondary | ICD-10-CM | POA: Diagnosis not present

## 2023-01-12 DIAGNOSIS — M25561 Pain in right knee: Secondary | ICD-10-CM | POA: Diagnosis not present

## 2023-01-12 DIAGNOSIS — M65861 Other synovitis and tenosynovitis, right lower leg: Secondary | ICD-10-CM | POA: Diagnosis not present
